# Patient Record
Sex: Male | Born: 1955
Health system: Southern US, Community
[De-identification: ages and names within clinical notes are randomized; demographics above are authoritative.]

## PROBLEM LIST (undated history)

## (undated) DIAGNOSIS — M170 Bilateral primary osteoarthritis of knee: Secondary | ICD-10-CM

## (undated) DIAGNOSIS — K219 Gastro-esophageal reflux disease without esophagitis: Secondary | ICD-10-CM

## (undated) DIAGNOSIS — E7800 Pure hypercholesterolemia, unspecified: Secondary | ICD-10-CM

## (undated) DIAGNOSIS — E785 Hyperlipidemia, unspecified: Secondary | ICD-10-CM

## (undated) DIAGNOSIS — E119 Type 2 diabetes mellitus without complications: Secondary | ICD-10-CM

## (undated) DIAGNOSIS — I10 Essential (primary) hypertension: Secondary | ICD-10-CM

## (undated) HISTORY — DX: Pure hypercholesterolemia, unspecified: E78.00

## (undated) HISTORY — DX: Gastro-esophageal reflux disease without esophagitis: K21.9

## (undated) HISTORY — PX: OTHER SURGICAL HISTORY: SHX169

## (undated) HISTORY — PX: CATARACT EXTRACTION: SUR2

---

## 1996-03-04 ENCOUNTER — Other Ambulatory Visit: Payer: Self-pay

## 2013-08-11 ENCOUNTER — Encounter (INDEPENDENT_AMBULATORY_CARE_PROVIDER_SITE_OTHER): Payer: Self-pay | Admitting: *Deleted

## 2017-02-21 ENCOUNTER — Emergency Department (HOSPITAL_COMMUNITY)
Admission: EM | Admit: 2017-02-21 | Discharge: 2017-02-22 | Disposition: A | Discharge status: Home / Self Care | Payer: BLUE CROSS/BLUE SHIELD | Attending: Emergency Medicine | Admitting: Emergency Medicine

## 2017-02-21 ENCOUNTER — Encounter (HOSPITAL_COMMUNITY): Payer: Self-pay

## 2017-02-21 ENCOUNTER — Emergency Department (HOSPITAL_COMMUNITY): Payer: BLUE CROSS/BLUE SHIELD

## 2017-02-21 DIAGNOSIS — E119 Type 2 diabetes mellitus without complications: Secondary | ICD-10-CM | POA: Diagnosis not present

## 2017-02-21 DIAGNOSIS — S161XXA Strain of muscle, fascia and tendon at neck level, initial encounter: Secondary | ICD-10-CM | POA: Diagnosis not present

## 2017-02-21 DIAGNOSIS — Y999 Unspecified external cause status: Secondary | ICD-10-CM | POA: Insufficient documentation

## 2017-02-21 DIAGNOSIS — I10 Essential (primary) hypertension: Secondary | ICD-10-CM | POA: Diagnosis not present

## 2017-02-21 DIAGNOSIS — Y9389 Activity, other specified: Secondary | ICD-10-CM | POA: Insufficient documentation

## 2017-02-21 DIAGNOSIS — S199XXA Unspecified injury of neck, initial encounter: Secondary | ICD-10-CM | POA: Diagnosis present

## 2017-02-21 DIAGNOSIS — Y92410 Unspecified street and highway as the place of occurrence of the external cause: Secondary | ICD-10-CM | POA: Insufficient documentation

## 2017-02-21 HISTORY — DX: Type 2 diabetes mellitus without complications: E11.9

## 2017-02-21 HISTORY — DX: Hyperlipidemia, unspecified: E78.5

## 2017-02-21 HISTORY — DX: Essential (primary) hypertension: I10

## 2017-02-21 MED ORDER — IBUPROFEN 400 MG PO TABS
400.0000 mg | ORAL_TABLET | Freq: Once | ORAL | Status: AC
  Administered 2017-02-21: 400 mg via ORAL
  Filled 2017-02-21: qty 1
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
## 2017-02-22 MED ORDER — NAPROXEN 500 MG PO TABS: 500.0000 mg | ORAL_TABLET | Freq: Two times a day (BID) | ORAL | 0 refills | Status: DC

## 2017-02-22 NOTE — Discharge Instructions (Signed)
Use the antiinflammatories as prescribed. Follow up with your doctor. Return to the ED if you develop new or worsening symptoms.

## 2017-09-03 ENCOUNTER — Ambulatory Visit (INDEPENDENT_AMBULATORY_CARE_PROVIDER_SITE_OTHER): Payer: BLUE CROSS/BLUE SHIELD | Admitting: Family Medicine

## 2017-09-03 ENCOUNTER — Encounter: Payer: Self-pay | Admitting: Family Medicine

## 2017-09-03 ENCOUNTER — Other Ambulatory Visit: Payer: Self-pay

## 2017-09-03 VITALS — BP 94/50 | HR 76 | Temp 98.2°F | Resp 18 | Ht 69.0 in | Wt 238.0 lb

## 2017-09-03 DIAGNOSIS — E1165 Type 2 diabetes mellitus with hyperglycemia: Secondary | ICD-10-CM

## 2017-09-03 DIAGNOSIS — I10 Essential (primary) hypertension: Secondary | ICD-10-CM | POA: Diagnosis not present

## 2017-09-03 DIAGNOSIS — Z79899 Other long term (current) drug therapy: Secondary | ICD-10-CM | POA: Diagnosis not present

## 2017-09-03 DIAGNOSIS — E1169 Type 2 diabetes mellitus with other specified complication: Secondary | ICD-10-CM | POA: Diagnosis not present

## 2017-09-03 DIAGNOSIS — Z23 Encounter for immunization: Secondary | ICD-10-CM

## 2017-09-03 DIAGNOSIS — I499 Cardiac arrhythmia, unspecified: Secondary | ICD-10-CM

## 2017-09-03 DIAGNOSIS — R5383 Other fatigue: Secondary | ICD-10-CM | POA: Diagnosis not present

## 2017-09-03 DIAGNOSIS — E785 Hyperlipidemia, unspecified: Secondary | ICD-10-CM

## 2017-09-03 MED ORDER — METFORMIN HCL ER 500 MG PO TB24: ORAL_TABLET | ORAL | 1 refills | Status: DC

## 2017-09-03 MED ORDER — METOPROLOL SUCCINATE ER 25 MG PO TB24: 25.0000 mg | ORAL_TABLET | Freq: Every day | ORAL | 3 refills | Status: DC

## 2017-09-03 NOTE — Patient Instructions (Signed)
Stop the Norvasc for Blood pressure Start the Metoprolol XR 25 mg , once a day  Go see the Cardiologist. Keep your follow up appointment with me.   Will give you pneumonia shot at follow up.

## 2017-09-03 NOTE — Progress Notes (Signed)
Patient ID: Christopher Sullivan, male    DOB: 04/28/1956, 61 y.o.   MRN: [id number]  Chief Complaint  Patient presents with  . Diabetes    Allergies Patient has no known allergies.  Subjective:   Christopher Sullivan is a 61 y.o. male who presents to [facility] today.  HPI Here to establish care. Reports that he has been doing well. Reports he does not always remember to take his diabetic medication twice a day. Has been taking his aspirin every day. Comes in because would like to establish care and needs refills. Works at [employer] and works third shift. Reports he has never been told that his heart rate was irregular. Denies any chest pain shortness of breath or palpitations. Reports that he feels fine. Has not taken any blood pressure medications for today. Has been diabetic for many years. Never on insulin denies any hypoglycemic episodes or problems with his diabetic medications. Has been working at losing weight. Followed by podiatry and has no lesions on his feet. Reports blood pressure runs okay. Has been on cholesterol medication for a long time. Denies any muscle aches or pains with his cholesterol medicine. Does not have a glucometer. Does not check his sugars at home. Reports that he goes out to eat several times a week. Denies any swelling in his legs, dyspnea on exertion or waking up from his sleep short of breath. Not short of breath with walking up hills. Does get tired at times but reports that works third shift and does not always get a lot of sleep.   Diabetes  He presents for his initial diabetic visit. He has type 2 diabetes mellitus. No MedicAlert identification noted. His disease course has been fluctuating. There are no hypoglycemic associated symptoms. Pertinent negatives for hypoglycemia include no dizziness, headaches, nervousness/anxiousness, speech difficulty or tremors. Pertinent negatives for diabetes include no chest pain, no fatigue, no foot ulcerations, no  polydipsia, no polyphagia, no visual change, no weakness and no weight loss. There are no hypoglycemic complications. Symptoms are stable. There are no diabetic complications. Risk factors for coronary artery disease include dyslipidemia, family history, male sex, obesity and hypertension. Current diabetic treatment includes oral agent (dual therapy). He is compliant with treatment most of the time. He is following a generally healthy diet. When asked about meal planning, he reported none. He participates in exercise intermittently. An ACE inhibitor/angiotensin II receptor blocker is being taken. He sees a podiatrist.Eye exam is current.    Past Medical History:  Diagnosis Date  . Diabetes mellitus without complication ([place])   . GERD (gastroesophageal reflux disease)   . Hyperlipidemia   . Hypertension     No past surgical history on file.  Family History  Problem Relation Age of Onset  . Hypertension Mother   . Diabetes Mother   . Arthritis Mother   . Heart disease Mother   . Miscarriages / [place] Mother   . Stroke Mother   . Depression Father   . Mental illness Father   . Lupus Sister   . Diabetes Daughter   . Hypertension Daughter      Social History   Social History  . Marital status: Unknown    Spouse name: N/A  . Number of children: N/A  . Years of education: N/A   Occupational History  . [place]    Social History Main Topics  . Smoking status: Never Smoker  . Smokeless tobacco: Never Used  . Alcohol use No  .  Drug use: No  . Sexual activity: Not Asked   Other Topics Concern  . None   Social History Narrative   Married. Works at [employer]. Third shift.    Two children/stepson and daughter.        Review of Systems  Constitutional: Negative for activity change, appetite change, chills, diaphoresis, fatigue, fever and weight loss.  HENT: Negative for hearing loss, rhinorrhea, sinus pain, sneezing, tinnitus, trouble swallowing and voice  change.   Eyes: Negative for photophobia and visual disturbance.  Respiratory: Negative for cough, choking, chest tightness and shortness of breath.   Cardiovascular: Negative for chest pain, palpitations and leg swelling.  Gastrointestinal: Negative for abdominal pain, nausea and vomiting.  Endocrine: Negative for cold intolerance, heat intolerance, polydipsia and polyphagia.  Genitourinary: Negative for dysuria and frequency.  Musculoskeletal: Negative for gait problem and neck pain.  Skin: Negative for rash.  Allergic/Immunologic: Negative for environmental allergies.  Neurological: Negative for dizziness, tremors, syncope, speech difficulty, weakness, numbness and headaches.  Hematological: Negative for adenopathy.  Psychiatric/Behavioral: Negative for agitation, decreased concentration, dysphoric mood and sleep disturbance. The patient is not nervous/anxious.      Objective:   BP (!) 94/50 (BP Location: Right Arm, Patient Position: Sitting, Cuff Size: Large)   Pulse 76   Temp 98.2 F (36.8 C) (Temporal)   Resp 18   Ht 5\' 9"  (1.753 m)   Wt 238 lb 0.6 oz (108 kg)   SpO2 98%   BMI 35.15 kg/m   Physical Exam  Constitutional: He is oriented to person, place, and time. He appears well-developed and well-nourished.  HENT:  Head: Normocephalic and atraumatic.  Eyes: Pupils are equal, round, and reactive to light. EOM are normal.  Neck: Normal range of motion. Neck supple. No thyromegaly present.  Cardiovascular: Normal heart sounds, intact distal pulses and normal pulses.  An irregularly irregular rhythm present. PMI is not displaced.   Pulses:      Dorsalis pedis pulses are 2+ on the right side, and 2+ on the left side.  Pulmonary/Chest: Effort normal and breath sounds normal.  Abdominal: Soft. Bowel sounds are normal.  Musculoskeletal: He exhibits no edema.  Neurological: He is alert and oriented to person, place, and time. No cranial nerve deficit.  Skin: Skin is warm, dry  and intact.  Psychiatric: He has a normal mood and affect. His behavior is normal. Thought content normal.  Vitals reviewed.    Assessment and Plan   1. Type 2 diabetes mellitus with hyperglycemia, unspecified whether long term insulin use ([place]) Eye exam current. Check feet at follow up.  Continue ASA 81 mg qd.  - glipiZIDE (GLUCOTROL) 10 MG tablet; Take 10 mg by mouth daily before breakfast. - COMPLETE METABOLIC PANEL WITH GFR - Hemoglobin A1c - Microalbumin / creatinine urine ratio - metFORMIN (GLUCOPHAGE XR) 500 MG 24 hr tablet; Take four pills each morning as directed  Dispense: 360 tablet; Refill: 1  2. Hyperlipidemia associated with type 2 diabetes mellitus (HCC) Hyperlipidemia and the associated risk of ASCVD were discussed today. Primary vs. Secondary prevention of ASCVD were discussed and how it relates to patient morbidity, mortality, and quality of life. Shared decision making with patient including the risks of statins vs.benefits of ASCVD risk reduction discussed.  Risks of stains discussed including myopathy, rhabdomyoloysis, liver problems, increased risk of diabetes discussed. We discussed heart healthy diet, lifestyle modifications, risk factor modifications, and adherence to the recommended treatment plan. We discussed the need to periodically monitor lipid panel  and liver function tests while on statin therapy. He wishes to continue therapy.   - simvastatin (ZOCOR) 40 MG tablet; Take 40 mg by mouth daily. - aspirin EC 81 MG tablet; Take 81 mg by mouth daily. - Lipid panel  3. HTN, goal below 130/80 DC the norvasc. Check in one month.  - lisinopril-hydrochlorothiazide (PRINZIDE,ZESTORETIC) 20-12.5 MG tablet; Take 1 tablet by mouth daily. - metoprolol succinate (TOPROL-XL) 25 MG 24 hr tablet; Take 1 tablet (25 mg total) by mouth daily.  Dispense: 90 tablet; Refill: 3 Patient counseled in detail regarding the risks of medication. Told to call or return to clinic if develop  any worrisome signs or symptoms. Patient voiced understanding.   4. Irregular heart rhythm Check labs. EKG done which reveals frequent PVCs. Counseled regarding worrisome s/s and if develop to the ED.  - metoprolol succinate (TOPROL-XL) 25 MG 24 hr tablet; Take 1 tablet (25 mg total) by mouth daily.  Dispense: 90 tablet; Refill: 3 - Ambulatory referral to Cardiology - TSH  5. High risk medication use Check liver tests.   6. Fatigue, unspecified type Labs ordered.  - CBC  7. Immunization due Pneumonia shot at follow-up - Flu Vaccine QUAD 36+ mos IM    Return in about 2 months (around 11/03/2017). [name], MD 09/03/2017

## 2017-09-04 ENCOUNTER — Telehealth: Payer: Self-pay | Admitting: Family Medicine

## 2017-09-04 ENCOUNTER — Encounter: Payer: Self-pay | Admitting: Family Medicine

## 2017-09-04 LAB — CBC
HEMATOCRIT: 42.8 % (ref 38.5–50.0)
Hemoglobin: 14.5 g/dL (ref 13.2–17.1)
MCH: 29.5 pg (ref 27.0–33.0)
MCHC: 33.9 g/dL (ref 32.0–36.0)
MCV: 87.2 fL (ref 80.0–100.0)
MPV: 11.7 fL (ref 7.5–12.5)
Platelets: 211 10*3/uL (ref 140–400)
RBC: 4.91 10*6/uL (ref 4.20–5.80)
RDW: 13.2 % (ref 11.0–15.0)
WBC: 9.6 10*3/uL (ref 3.8–10.8)

## 2017-09-04 LAB — COMPLETE METABOLIC PANEL WITH GFR
AG RATIO: 1.2 (calc) (ref 1.0–2.5)
ALKALINE PHOSPHATASE (APISO): 84 U/L (ref 40–115)
ALT: 22 U/L (ref 9–46)
AST: 15 U/L (ref 10–35)
Albumin: 4.1 g/dL (ref 3.6–5.1)
BUN: 11 mg/dL (ref 7–25)
CO2: 27 mmol/L (ref 20–32)
Calcium: 9.9 mg/dL (ref 8.6–10.3)
Chloride: 102 mmol/L (ref 98–110)
Creat: 0.91 mg/dL (ref 0.70–1.25)
GFR, EST NON AFRICAN AMERICAN: 91 mL/min/{1.73_m2} (ref >=60)
GFR, Est African American: 105 mL/min/{1.73_m2} (ref >=60)
GLOBULIN: 3.3 g/dL (ref 1.9–3.7)
Glucose, Bld: 104 mg/dL — ABNORMAL HIGH (ref 65–99)
POTASSIUM: 3.7 mmol/L (ref 3.5–5.3)
SODIUM: 139 mmol/L (ref 135–146)
Total Bilirubin: 0.5 mg/dL (ref 0.2–1.2)
Total Protein: 7.4 g/dL (ref 6.1–8.1)

## 2017-09-04 LAB — HEMOGLOBIN A1C
HEMOGLOBIN A1C: 7.6 %{Hb} — AB (ref <5.7)
MEAN PLASMA GLUCOSE: 171 (calc)
eAG (mmol/L): 9.5 (calc)

## 2017-09-04 LAB — TSH: TSH: 2.79 m[IU]/L (ref 0.40–4.50)

## 2017-09-04 LAB — LIPID PANEL
CHOLESTEROL: 140 mg/dL (ref <200)
HDL: 46 mg/dL (ref >40)
LDL Cholesterol (Calc): 63 mg/dL (calc)
NON-HDL CHOLESTEROL (CALC): 94 mg/dL (ref <130)
Total CHOL/HDL Ratio: 3 (calc) (ref <5.0)
Triglycerides: 263 mg/dL — ABNORMAL HIGH (ref <150)

## 2017-09-04 LAB — MICROALBUMIN / CREATININE URINE RATIO
Creatinine, Urine: 201 mg/dL (ref 20–320)
Microalb Creat Ratio: 70 mcg/mg creat — ABNORMAL HIGH (ref <30)
Microalb, Ur: 14 mg/dL

## 2017-09-04 NOTE — Telephone Encounter (Signed)
Called patient regarding message below. No answer, left generic message for patient to return call.   

## 2017-09-04 NOTE — Telephone Encounter (Signed)
-----   Message from [name], MD sent at 09/04/2017  8:46 AM EDT ----- Please call and advise that his Hemglobin A1C was 7.6%. Advise patient that if he takes his metformin as directed that his blood sugar should be controlled when we recheck them. Advised that blood counts were normal. Electrolytes were normal. Advised that due to his diabetes he was having some protein in his urine. We will discuss this further at follow-up and likely increase one of his medications. Please advised that his thyroid was normal. Advised that kidney function is within normal limits however he is having some minimal damage due to the diabetes. Please advised that cholesterol was pretty good but he does need to decrease the amount of fats, processed food that he is eating because his triglycerides are high. We will discuss all labs at follow-up. Advised him to keep his appointment with cardiology.Gwen Her. Mannie Stabile, MD

## 2017-09-09 NOTE — Telephone Encounter (Signed)
I have been unable to reach this patient by phone.  A letter is being sent.

## 2017-10-01 ENCOUNTER — Ambulatory Visit (HOSPITAL_COMMUNITY)
Admission: RE | Admit: 2017-10-01 | Discharge: 2017-10-01 | Disposition: A | Discharge status: Home / Self Care | Payer: BLUE CROSS/BLUE SHIELD | Source: Ambulatory Visit | Attending: Cardiology | Admitting: Cardiology

## 2017-10-01 ENCOUNTER — Encounter: Payer: Self-pay | Admitting: Cardiology

## 2017-10-01 ENCOUNTER — Ambulatory Visit: Payer: BLUE CROSS/BLUE SHIELD | Admitting: Cardiology

## 2017-10-01 VITALS — BP 126/78 | HR 78 | Ht 72.0 in | Wt 244.0 lb

## 2017-10-01 DIAGNOSIS — I493 Ventricular premature depolarization: Secondary | ICD-10-CM

## 2017-10-01 NOTE — Patient Instructions (Signed)
Medication Instructions:  Your physician recommends that you continue on your current medications as directed. Please refer to the Current Medication list given to you today.'  Labwork: none  Testing/Procedures: Your physician has recommended that you wear a holter monitor. Holter monitors are medical devices that record the heart's electrical activity. Doctors most often use these monitors to diagnose arrhythmias. Arrhythmias are problems with the speed or rhythm of the heartbeat. The monitor is a small, portable device. You can wear one while you do your normal daily activities. This is usually used to diagnose what is causing palpitations/syncope (passing out).    Follow-Up: Your physician recommends that you schedule a follow-up appointment in: 6 weeks    Any Other Special Instructions Will Be Listed Below (If Applicable).     If you need a refill on your cardiac medications before your next appointment, please call your pharmacy.

## 2017-10-01 NOTE — Progress Notes (Signed)
Clinical Summary Christopher Sullivan is a 61 y.o.male seen as new consult, referred by Dr Mannie Stabile for iregular heart beat and PVCs.   1. Irregular heart beat/PVCs - noted recently by pcp to have frequent PVCs on EKG - started on Toprol XL 25mg  by pcp at that time - recent K was 3.7, TSH 2.79  - denies any palpitations - aching pain left chest can last a few minutes. Can occur at rest or with exertion. Total of 2-3 episodes, none recently - no edema. No SOB/DOE. 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  - limited caffeine. No EtoH.      Past Medical History:  Diagnosis Date  . Diabetes mellitus without complication ([place])   . GERD (gastroesophageal reflux disease)   . Hyperlipidemia   . Hypertension      No Known Allergies   Current Outpatient Medications  Medication Sig Dispense Refill  . aspirin EC 81 MG tablet Take 81 mg by mouth daily.    Marland Kitchen glipiZIDE (GLUCOTROL) 10 MG tablet Take 10 mg by mouth daily before breakfast.    . lisinopril-hydrochlorothiazide (PRINZIDE,ZESTORETIC) 20-12.5 MG tablet Take 1 tablet by mouth daily.    . metFORMIN (GLUCOPHAGE XR) 500 MG 24 hr tablet Take four pills each morning as directed 360 tablet 1  . metoprolol succinate (TOPROL-XL) 25 MG 24 hr tablet Take 1 tablet (25 mg total) by mouth daily. 90 tablet 3  . naproxen (NAPROSYN) 500 MG tablet Take 1 tablet (500 mg total) by mouth 2 (two) times daily. 30 tablet 0  . simvastatin (ZOCOR) 40 MG tablet Take 40 mg by mouth daily.     No current facility-administered medications for this visit.      No past surgical history on file.   No Known Allergies    Family History  Problem Relation Age of Onset  . Hypertension Mother   . Diabetes Mother   . Arthritis Mother   . Heart disease Mother   . Miscarriages / [place] Mother   . Stroke Mother   . Depression Father   . Mental illness Father   . Lupus Sister   . Diabetes Daughter   . Hypertension Daughter        Social History Christopher Sullivan reports that  has never smoked. he has never used smokeless tobacco. Christopher Sullivan reports that he does not drink alcohol.   Review of Systems CONSTITUTIONAL: No weight loss, fever, chills, weakness or fatigue.  HEENT: Eyes: No visual loss, blurred vision, double vision or yellow sclerae.No hearing loss, sneezing, congestion, runny nose or sore throat.  SKIN: No rash or itching.  CARDIOVASCULAR: per hpi RESPIRATORY: No shortness of breath, cough or sputum.  GASTROINTESTINAL: No anorexia, nausea, vomiting or diarrhea. No abdominal pain or blood.  GENITOURINARY: No burning on urination, no polyuria NEUROLOGICAL: No headache, dizziness, syncope, paralysis, ataxia, numbness or tingling in the extremities. No change in bowel or bladder control.  MUSCULOSKELETAL: No muscle, back pain, joint pain or stiffness.  LYMPHATICS: No enlarged nodes. No history of splenectomy.  PSYCHIATRIC: No history of depression or anxiety.  ENDOCRINOLOGIC: No reports of sweating, cold or heat intolerance. No polyuria or polydipsia.  Marland Kitchen   Physical Examination Vitals:   10/01/17 0840 10/01/17 0847  BP: 132/86 126/78  Pulse: 78   SpO2: 98%    Vitals:   10/01/17 0840  Weight: 244 lb (110.7 kg)  Height: 6' (1.829 m)    Gen: resting comfortably, no acute  distress HEENT: no scleral icterus, pupils equal round and reactive, no palptable cervical adenopathy,  CV: irreg, no m/r/g, no jvd Resp: Clear to auscultation bilaterally GI: abdomen is soft, non-tender, non-distended, normal bowel sounds, no hepatosplenomegaly MSK: extremities are warm, no edema.  Skin: warm, no rash Neuro:  no focal deficits Psych: appropriate affect   Diagnostic Studies     Assessment and Plan  1. PVCs - we will obtain a 24 hr holter monitor to quantify his pvc burden. Pending results will consider echo to evaluate for structurual heart disease and possible stress testing as well. Futher testing pending  holter results - continue beta blocker   F/u 6 weeks      Arnoldo Lenis, M.D.

## 2017-10-03 ENCOUNTER — Encounter: Payer: Self-pay | Admitting: Cardiology

## 2017-10-09 ENCOUNTER — Telehealth: Payer: Self-pay

## 2017-10-09 NOTE — Telephone Encounter (Signed)
Called pt., no answer. Left message for pt. To return call.  

## 2017-10-09 NOTE — Telephone Encounter (Signed)
-----   Message from Arnoldo Lenis, MD sent at 10/09/2017  1:30 PM EST ----- Heart monitor looks good, only occaional extra heart beats. Not enough to be concerned. I'd like to see him back in 2 months to discuss further and  reevaluate   Christopher Abts MD

## 2017-10-11 ENCOUNTER — Telehealth: Payer: Self-pay | Admitting: *Deleted

## 2017-10-11 NOTE — Telephone Encounter (Signed)
Called patient with test results. No answer. Left message to call back.  

## 2017-10-11 NOTE — Telephone Encounter (Signed)
-----   Message from Arnoldo Lenis, MD sent at 10/09/2017  1:30 PM EST ----- Heart monitor looks good, only occaional extra heart beats. Not enough to be concerned. I'd like to see him back in 2 months to discuss further and  reevaluate   Zandra Abts MD

## 2017-11-06 ENCOUNTER — Ambulatory Visit: Payer: BLUE CROSS/BLUE SHIELD | Admitting: Family Medicine

## 2017-11-14 ENCOUNTER — Other Ambulatory Visit: Payer: Self-pay | Admitting: Family Medicine

## 2017-11-27 ENCOUNTER — Ambulatory Visit: Payer: BLUE CROSS/BLUE SHIELD | Admitting: Cardiology

## 2017-11-27 ENCOUNTER — Encounter: Payer: Self-pay | Admitting: Cardiology

## 2017-11-27 NOTE — Progress Notes (Deleted)
Clinical Summary Mr. Seabrook is a 62 y.o.male  1. Irregular heart beat/PVCs - noted recently by pcp to have frequent PVCs on EKG - started on Toprol XL 25mg  by pcp at that time - recent K was 3.7, TSH 2.79  - denies any palpitations - aching pain left chest can last a few minutes. Can occur at rest or with exertion. Total of 2-3 episodes, none recently - no edema. No SOB/DOE. 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  - limited caffeine. No EtoH.   09/2017 holter with only 3% PVC burden, no significant NSVT or VT -   Past Medical History:  Diagnosis Date  . Diabetes mellitus without complication ([place])   . GERD (gastroesophageal reflux disease)   . Hyperlipidemia   . Hypertension      No Known Allergies   Current Outpatient Medications  Medication Sig Dispense Refill  . aspirin EC 81 MG tablet Take 81 mg by mouth daily.    Marland Kitchen glipiZIDE (GLUCOTROL) 10 MG tablet Take 10 mg by mouth daily before breakfast.    . lisinopril-hydrochlorothiazide (PRINZIDE,ZESTORETIC) 20-12.5 MG tablet Take 1 tablet by mouth daily.    Marland Kitchen lisinopril-hydrochlorothiazide (PRINZIDE,ZESTORETIC) 20-25 MG tablet TAKE ONE (1) TABLET EACH DAY 90 tablet 0  . metFORMIN (GLUCOPHAGE XR) 500 MG 24 hr tablet Take four pills each morning as directed 360 tablet 1  . metoprolol succinate (TOPROL-XL) 25 MG 24 hr tablet Take 1 tablet (25 mg total) by mouth daily. 90 tablet 3  . simvastatin (ZOCOR) 40 MG tablet Take 40 mg by mouth daily.     No current facility-administered medications for this visit.      No past surgical history on file.   No Known Allergies    Family History  Problem Relation Age of Onset  . Hypertension Mother   . Diabetes Mother   . Arthritis Mother   . Heart disease Mother   . Miscarriages / [place] Mother   . Stroke Mother   . Depression Father   . Mental illness Father   . Lupus Sister   . Diabetes Daughter   . Hypertension Daughter       Social History Mr. Bensinger reports that  has never smoked. he has never used smokeless tobacco. Mr. Vogel reports that he does not drink alcohol.   Review of Systems CONSTITUTIONAL: No weight loss, fever, chills, weakness or fatigue.  HEENT: Eyes: No visual loss, blurred vision, double vision or yellow sclerae.No hearing loss, sneezing, congestion, runny nose or sore throat.  SKIN: No rash or itching.  CARDIOVASCULAR:  RESPIRATORY: No shortness of breath, cough or sputum.  GASTROINTESTINAL: No anorexia, nausea, vomiting or diarrhea. No abdominal pain or blood.  GENITOURINARY: No burning on urination, no polyuria NEUROLOGICAL: No headache, dizziness, syncope, paralysis, ataxia, numbness or tingling in the extremities. No change in bowel or bladder control.  MUSCULOSKELETAL: No muscle, back pain, joint pain or stiffness.  LYMPHATICS: No enlarged nodes. No history of splenectomy.  PSYCHIATRIC: No history of depression or anxiety.  ENDOCRINOLOGIC: No reports of sweating, cold or heat intolerance. No polyuria or polydipsia.  Marland Kitchen   Physical Examination There were no vitals filed for this visit. There were no vitals filed for this visit.  Gen: resting comfortably, no acute distress HEENT: no scleral icterus, pupils equal round and reactive, no palptable cervical adenopathy,  CV Resp: Clear to auscultation bilaterally GI: abdomen is soft, non-tender, non-distended, normal bowel sounds, no hepatosplenomegaly MSK: extremities  are warm, no edema.  Skin: warm, no rash Neuro:  no focal deficits Psych: appropriate affect   Diagnostic Studies  09/2017 24 hr heart monitor 24-hour Holter monitor reviewed. Sinus rhythm is present throughout. Heart rate ranged from 58 bpm up to 111 bpm with average heart rate 79 bpm. There were no significant pauses. Occasional PVCs were noted with rare couplets and bigeminy (approximately 3% of total beats). Rare PACs noted.   Assessment and Plan  1.  PVCs - we will obtain a 24 hr holter monitor to quantify his pvc burden. Pending results will consider echo to evaluate for structurual heart disease and possible stress testing as well. Futher testing pending holter results - continue beta blocker   F/u 6 weeks      Arnoldo Lenis, M.D.

## 2017-12-04 ENCOUNTER — Ambulatory Visit: Payer: BLUE CROSS/BLUE SHIELD | Admitting: Family Medicine

## 2017-12-11 ENCOUNTER — Ambulatory Visit (INDEPENDENT_AMBULATORY_CARE_PROVIDER_SITE_OTHER): Payer: BLUE CROSS/BLUE SHIELD | Admitting: Family Medicine

## 2017-12-11 ENCOUNTER — Encounter: Payer: Self-pay | Admitting: Family Medicine

## 2017-12-11 ENCOUNTER — Other Ambulatory Visit: Payer: Self-pay

## 2017-12-11 VITALS — BP 120/82 | HR 84 | Temp 98.9°F | Resp 16 | Ht 72.0 in | Wt 249.2 lb

## 2017-12-11 DIAGNOSIS — E1169 Type 2 diabetes mellitus with other specified complication: Secondary | ICD-10-CM

## 2017-12-11 DIAGNOSIS — I10 Essential (primary) hypertension: Secondary | ICD-10-CM

## 2017-12-11 DIAGNOSIS — Z79899 Other long term (current) drug therapy: Secondary | ICD-10-CM | POA: Diagnosis not present

## 2017-12-11 DIAGNOSIS — Z1211 Encounter for screening for malignant neoplasm of colon: Secondary | ICD-10-CM

## 2017-12-11 DIAGNOSIS — I493 Ventricular premature depolarization: Secondary | ICD-10-CM | POA: Diagnosis not present

## 2017-12-11 DIAGNOSIS — E119 Type 2 diabetes mellitus without complications: Secondary | ICD-10-CM | POA: Diagnosis not present

## 2017-12-11 DIAGNOSIS — R1310 Dysphagia, unspecified: Secondary | ICD-10-CM

## 2017-12-11 DIAGNOSIS — E785 Hyperlipidemia, unspecified: Secondary | ICD-10-CM | POA: Diagnosis not present

## 2017-12-11 MED ORDER — METFORMIN HCL 1000 MG PO TABS: 1000.0000 mg | ORAL_TABLET | Freq: Two times a day (BID) | ORAL | 3 refills | Status: DC

## 2017-12-11 NOTE — Progress Notes (Signed)
Patient ID: Christopher Sullivan, male    DOB: Mar 28, 1956, 62 y.o.   MRN: [id number]  Chief Complaint  Patient presents with  . Follow-up    Allergies Patient has no known allergies.  Subjective:   Christopher Sullivan is a 62 y.o. male who presents to [facility] today.  HPI Christopher Sullivan presents today for follow-up.  He has not been here since his initial visit in October.  He reports he has been taking his medications as directed.  He does take metformin 4 pills a day.  He would like for this pill burden to be decreased.  He feels like his blood pressure is been running well.  He denies any chest pain, shortness of breath, swelling in his extremities.  He did follow up with Dr. Harl Sullivan for the PVCs that were detected on his cardiac evaluation and EKG in our office at his last visit.  He had a Holter monitor performed which revealed a low burden of PVCs.  He reports he has not felt any flutters in his heart or any chest pain.  He has been taking the Toprol-XL as directed.  He does report that he has had some difficulty in swallowing certain foods over the past several months.  He reports that if he eats steak that it feels like the food gets stuck.  He reports that he is not always able to chew his food as well as he should because of poor dentition.  He reports he does plan on following up with the dentist in regards to his teeth.  However he does report that even at times when he swallows his pills he feels like they get stuck and he has to go into the bathroom and vomit to get the pills to come back up.  He has not had any weight loss.  Denies abdominal pain.  Denies melena, bright red blood per rectum, or hematochezia.  He has never experienced this symptom in the past.  He reports he is not a tobacco smoker.    Past Medical History:  Diagnosis Date  . Diabetes mellitus without complication ([place])   . GERD (gastroesophageal reflux disease)   . Hyperlipidemia   . Hypertension     No past  surgical history on file.  Family History  Problem Relation Age of Onset  . Hypertension Mother   . Diabetes Mother   . Arthritis Mother   . Heart disease Mother   . Miscarriages / [place] Mother   . Stroke Mother   . Depression Father   . Mental illness Father   . Lupus Sister   . Diabetes Daughter   . Hypertension Daughter      Social History   Socioeconomic History  . Marital status: Married    Spouse name: Not on file  . Number of children: Not on file  . Years of education: Not on file  . Highest education level: Not on file  Social Needs  . Financial resource strain: Not on file  . Food insecurity - worry: Not on file  . Food insecurity - inability: Not on file  . Transportation needs - medical: Not on file  . Transportation needs - non-medical: Not on file  Occupational History  . Occupation: [employer]  Tobacco Use  . Smoking status: Never Smoker  . Smokeless tobacco: Never Used  Substance and Sexual Activity  . Alcohol use: No  . Drug use: No  . Sexual activity: Not on file  Other  Topics Concern  . Not on file  Social History Narrative   Married. Works at [employer]. Third shift.    Two children/stepson and daughter.    Current Outpatient Medications on File Prior to Visit  Medication Sig Dispense Refill  . aspirin EC 81 MG tablet Take 81 mg by mouth daily.    Marland Kitchen glipiZIDE (GLUCOTROL) 10 MG tablet Take 10 mg by mouth daily before breakfast.    . lisinopril-hydrochlorothiazide (PRINZIDE,ZESTORETIC) 20-12.5 MG tablet Take 1 tablet by mouth daily.    . metoprolol succinate (TOPROL-XL) 25 MG 24 hr tablet Take 1 tablet (25 mg total) by mouth daily. 90 tablet 3  . simvastatin (ZOCOR) 40 MG tablet Take 40 mg by mouth daily.     No current facility-administered medications on file prior to visit.     Review of Systems   Objective:   BP 120/82 (BP Location: Left Arm, Patient Position: Sitting, Cuff Size: Normal)   Pulse 84   Temp 98.9 F  (37.2 C) (Temporal)   Resp 16   Ht 6' (1.829 m)   Wt 249 lb 4 oz (113.1 kg)   SpO2 97%   BMI 33.80 kg/m   Physical Exam  Constitutional: He is oriented to person, place, and time. He appears well-developed and well-nourished.  HENT:  Head: Normocephalic and atraumatic.  Mouth/Throat: Oropharynx is clear and moist.  Dentition in poor repair.  Patient missing multiple teeth.  Eyes: EOM are normal. Pupils are equal, round, and reactive to light.  Neck: Normal range of motion. Neck supple. No JVD present. No tracheal deviation present. No thyromegaly present.  Cardiovascular: Normal rate, regular rhythm and normal heart sounds.  Pulmonary/Chest: Effort normal and breath sounds normal.  Abdominal: Soft. Bowel sounds are normal. He exhibits no distension. There is no tenderness.  Musculoskeletal: He exhibits no edema.  Lymphadenopathy:    He has no cervical adenopathy.  Neurological: He is alert and oriented to person, place, and time. No cranial nerve deficit.  Skin: Skin is warm, dry and intact.  Psychiatric: He has a normal mood and affect. His behavior is normal. Thought content normal.  Vitals reviewed.    Assessment and Plan  1. Dysphagia, unspecified type Patient was counseled that we need to refer him to GI for evaluation of his esophagus.  We discussed that possible stricture versus esophageal dysmotility.  Since this is mainly occurring with bulky foods and when swallowing multiple pills at one time, he was encouraged to refrain from eating steak until evaluation.  In addition he was encouraged not to swallow for pills at a time but to swallow his pills one at a time.  Patient is also in need of colon cancer screening.  It would be beneficial for him to get his colonoscopy at time of endoscopy if indicated. - Ambulatory referral to Gastroenterology  2. Type 2 diabetes mellitus without complication, without long-term current use of insulin [facility]) Patient defers diabetic foot  exam today.  He reports he has worked all night in [employer] features [employer] and does not want to take his shoes off. - Hemoglobin A1c - Ambulatory referral to Ophthalmology -Change medication to metformin 1000 mg 1 p.o. twice daily.  New prescription sent in. -Foot care discussed with patient today. -We also discussed that her last visit patient's microalbumin was elevated.  We will plan to check this in 6 months.  He is to continue his ACE inhibitor as directed.  Control of blood pressure and diabetic control recommended. 3. Hyperlipidemia  associated with type 2 diabetes mellitus ([place]) Patient is LDL was significantly elevated at his last check.  Since then we have added statin medication.  Prior to his next visit he will return to clinic and get his lab work done and will adjust statin as needed to obtain an LDL of less than 70. - Lipid panel  4. HTN, goal below 130/80 Good control.  Continue medication as directed. - Basic metabolic panel  5. High risk medication use  - Basic metabolic panel - Hepatic function panel  6. Screen for colon cancer - Ambulatory referral to Gastroenterology 7.  PVCs Patient was seen and evaluated by cardiology.  Holter monitor revealed low burden of PVCs.  Was not felt that echo was indicated per cardiology.  Patient reports he will keep his scheduled visit with Dr. Harl Sullivan as directed.  Office visit note reviewed today from cardiology. Return in about 2 months (around 02/08/2018). [name], MD 12/11/2017

## 2017-12-11 NOTE — Patient Instructions (Signed)

## 2017-12-16 ENCOUNTER — Encounter: Payer: Self-pay | Admitting: Gastroenterology

## 2017-12-19 ENCOUNTER — Other Ambulatory Visit: Payer: Self-pay | Admitting: Family Medicine

## 2017-12-19 DIAGNOSIS — E785 Hyperlipidemia, unspecified: Principal | ICD-10-CM

## 2017-12-19 DIAGNOSIS — E1169 Type 2 diabetes mellitus with other specified complication: Secondary | ICD-10-CM

## 2017-12-23 ENCOUNTER — Other Ambulatory Visit: Payer: Self-pay | Admitting: Family Medicine

## 2017-12-23 DIAGNOSIS — E1165 Type 2 diabetes mellitus with hyperglycemia: Secondary | ICD-10-CM

## 2018-01-23 ENCOUNTER — Ambulatory Visit: Payer: BLUE CROSS/BLUE SHIELD | Admitting: Gastroenterology

## 2018-01-28 ENCOUNTER — Other Ambulatory Visit: Payer: Self-pay | Admitting: Family Medicine

## 2018-01-28 MED ORDER — GLUCOSE BLOOD VI STRP: 1.0000 | ORAL_STRIP | Freq: Two times a day (BID) | 12 refills | Status: DC

## 2018-01-29 ENCOUNTER — Telehealth: Payer: Self-pay | Admitting: Family Medicine

## 2018-01-29 DIAGNOSIS — E119 Type 2 diabetes mellitus without complications: Secondary | ICD-10-CM

## 2018-01-29 MED ORDER — BLOOD GLUCOSE MONITOR KIT: PACK | 0 refills | Status: DC

## 2018-01-29 NOTE — Telephone Encounter (Signed)
Referral to Diabetic Education and blood sugar monitor ordered per Dr. Mannie Stabile

## 2018-02-11 ENCOUNTER — Encounter: Payer: Self-pay | Admitting: Family Medicine

## 2018-02-11 ENCOUNTER — Other Ambulatory Visit: Payer: Self-pay

## 2018-02-11 ENCOUNTER — Ambulatory Visit: Payer: BLUE CROSS/BLUE SHIELD | Admitting: Family Medicine

## 2018-02-11 VITALS — BP 130/78 | HR 81 | Temp 98.5°F | Resp 16 | Ht 72.0 in | Wt 245.0 lb

## 2018-02-11 DIAGNOSIS — E785 Hyperlipidemia, unspecified: Secondary | ICD-10-CM | POA: Diagnosis not present

## 2018-02-11 DIAGNOSIS — Z23 Encounter for immunization: Secondary | ICD-10-CM

## 2018-02-11 DIAGNOSIS — I10 Essential (primary) hypertension: Secondary | ICD-10-CM

## 2018-02-11 DIAGNOSIS — N509 Disorder of male genital organs, unspecified: Secondary | ICD-10-CM | POA: Diagnosis not present

## 2018-02-11 DIAGNOSIS — E1140 Type 2 diabetes mellitus with diabetic neuropathy, unspecified: Secondary | ICD-10-CM

## 2018-02-11 DIAGNOSIS — E1169 Type 2 diabetes mellitus with other specified complication: Secondary | ICD-10-CM

## 2018-02-11 NOTE — Progress Notes (Signed)
Patient ID: Christopher Sullivan, male    DOB: 13-Apr-1956, 62 y.o.   MRN: [id number]  Chief Complaint  Patient presents with  . Follow-up    Allergies Patient has no known allergies.  Subjective:   Christopher Sullivan is a 62 y.o. male who presents to [facility] today.  HPI [name] presents today for follow-up.  He is taking his blood pressure medication, cholesterol medication, and diabetes medications.  He did not get his blood work prior to coming in today.  He was last seen several months ago.  He reports that he has been doing well.  He does not check his blood sugars on a regular basis.  He denies any hypoglycemic episodes.  He reports that for the past 2 years, he has had some numbness in 2 toes.  He reports that he uses Vicks salve and it helps with the pain.  He reports that when he uses the cream on his feet that his feet do not bother him and he has no trouble walking.  He is followed by podiatry.  He has a history of some arthritis in his toes and hammertoe deformity of toes.  He wears shoe inserts.  He denies any lesions on his feet.  He reports that he checks his feet daily and takes good care of them.  He denies any chest pain, shortness of breath, palpitations, or swelling in his extremities.  His energy level is good.  Appetite is good.  Bowel movements are regular.  Denies any melena or bright red blood per rectum.  Reports his blood pressure is been running well.  No side effects with his medications.  Denies any myalgias.  Takes cholesterol medication nightly.  Reports that for about 10 years he has had a skin lesion on his scrotum that has gotten bigger in size.  Reports that it is slightly pinkish in color.  Denies any pain unless it rubs with walking.  No bleeding in area.  No history of skin cancer.  Reports that he is always wondered what it is and wanted to have it checked.  Reports he is urinating well.  No masses in his groin area.  No penile discharge.  Denies any  ulcerations in his genital area.  No history of sexually transmitted infections.    Past Medical History:  Diagnosis Date  . Diabetes mellitus without complication ([place])   . GERD (gastroesophageal reflux disease)   . Hyperlipidemia   . Hypertension     No past surgical history on file.  Family History  Problem Relation Age of Onset  . Hypertension Mother   . Diabetes Mother   . Arthritis Mother   . Heart disease Mother   . Miscarriages / [place] Mother   . Stroke Mother   . Depression Father   . Mental illness Father   . Lupus Sister   . Diabetes Daughter   . Hypertension Daughter      Social History   Socioeconomic History  . Marital status: Married    Spouse name: None  . Number of children: None  . Years of education: None  . Highest education level: None  Social Needs  . Financial resource strain: None  . Food insecurity - worry: None  . Food insecurity - inability: None  . Transportation needs - medical: None  . Transportation needs - non-medical: None  Occupational History  . Occupation: [employer]  Tobacco Use  . Smoking status: Never Smoker  . Smokeless  tobacco: Never Used  Substance and Sexual Activity  . Alcohol use: No  . Drug use: No  . Sexual activity: None  Other Topics Concern  . None  Social History Narrative   Married. Works at [employer]. Third shift.    Two children/stepson and daughter.    Current Outpatient Medications on File Prior to Visit  Medication Sig Dispense Refill  . aspirin EC 81 MG tablet Take 81 mg by mouth daily.    . blood glucose meter kit and supplies KIT Dispense based on patient and insurance preference. Use up to four times daily as directed. (FOR ICD-9 250.00, 250.01). 1 each 0  . glipiZIDE (GLUCOTROL) 10 MG tablet TAKE ONE (1) TABLET EACH DAY 90 tablet 0  . glucose blood (CONTOUR NEXT TEST) test strip 1 each by Other route 2 (two) times daily. Use as instructed 100 each 12  .  lisinopril-hydrochlorothiazide (PRINZIDE,ZESTORETIC) 20-12.5 MG tablet Take 1 tablet by mouth daily.    . metFORMIN (GLUCOPHAGE) 1000 MG tablet Take 1 tablet (1,000 mg total) by mouth 2 (two) times daily with a meal. 180 tablet 3  . metoprolol succinate (TOPROL-XL) 25 MG 24 hr tablet Take 1 tablet (25 mg total) by mouth daily. 90 tablet 3  . simvastatin (ZOCOR) 40 MG tablet TAKE ONE (1) TABLET BY MOUTH EVERY DAY 90 tablet 0   No current facility-administered medications on file prior to visit.     Review of Systems  Constitutional: Negative for fatigue and unexpected weight change.  Eyes: Negative for pain and visual disturbance.  Respiratory: Negative for cough and shortness of breath.   Cardiovascular: Negative for chest pain, palpitations and leg swelling.  Gastrointestinal: Negative for abdominal pain, constipation, diarrhea, nausea and vomiting.  Genitourinary: Negative for decreased urine volume, difficulty urinating, frequency, genital sores, hematuria, scrotal swelling, testicular pain and urgency.  Musculoskeletal: Negative for myalgias.  Skin:       Area on his skin he would like to have checked near his scrotum.  Neurological: Negative for tremors, syncope and light-headedness.  Psychiatric/Behavioral: Negative for dysphoric mood and sleep disturbance.     Objective:   BP 130/78 (BP Location: Left Arm, Patient Position: Sitting, Cuff Size: Normal)   Pulse 81   Temp 98.5 F (36.9 C) (Temporal)   Resp 16   Ht 6' (1.829 m)   Wt 245 lb (111.1 kg)   SpO2 97%   BMI 33.23 kg/m   Physical Exam  Constitutional: He is oriented to person, place, and time. He appears well-developed and well-nourished.  HENT:  Head: Normocephalic and atraumatic.  Eyes: EOM are normal. Pupils are equal, round, and reactive to light.  Neck: Normal range of motion. Neck supple. No thyromegaly present.  Cardiovascular: Normal rate, regular rhythm and normal heart sounds.  Pulses:      Dorsalis  pedis pulses are 0 on the right side, and 0 on the left side.  Pulmonary/Chest: Effort normal and breath sounds normal.  Musculoskeletal: He exhibits no edema.       Right foot: There is deformity. There is normal range of motion.       Left foot: There is deformity. There is normal range of motion.  Feet:  Right Foot:  Protective Sensation: 8 sites tested. 6 sites sensed.  Skin Integrity: Positive for callus. Negative for ulcer or blister.  Left Foot:  Protective Sensation: 8 sites tested. 6 sites sensed.  Skin Integrity: Positive for callus. Negative for ulcer or blister.  Neurological: He  is alert and oriented to person, place, and time. No cranial nerve deficit.  Skin: Skin is warm, dry and intact.  Left side lower scrotal region with 0.5 cm x 0.6 cm pedunculated skin tag.  Lesion pinkish in coloration.  Slightly tender to palpation.  No associated edema or induration.  Psychiatric: He has a normal mood and affect. His behavior is normal. Thought content normal.  Vitals reviewed.    Assessment and Plan  1. Skin lesion of scrotum Discussion with patient and his wife today regarding lesion.  At this time suspect the area in question is a pedunculated skin tag which is inflamed.  Patient would like to return to clinic for removal.  We did discuss that if we remove the area we will send it to pathology.  He voiced understanding.  Reports he was scheduled this visit in the near future but does not want to have it done today.  2. Type 2 diabetes mellitus with diabetic neuropathy, without long-term current use of insulin (HCC) Hemoglobin A1c and lab.  Elevated microalbumin creatinine ratio.  Plan to recheck in 6 months.  Diet, exercise, food choices, and weight loss discussed today.  3. Hyperlipidemia associated with type 2 diabetes mellitus ([place]) Any statin medication.  LDL at goal.  4. HTN, goal below 130/80 Control.The patient is asked to make an attempt to improve diet and exercise  patterns to aid in medical management of this problem. Lifestyle modifications discussed with patient including a diet emphasizing vegetables, fruits, and whole grains. Limiting intake of sodium to less than 2,400 mg per day.  Recommendations discussed include consuming low-fat dairy products, poultry, fish, legumes, non-tropical vegetable oils, and nuts; and limiting intake of sweets, sugar-sweetened beverages, and red meat. Discussed following a plan such as the Dietary Approaches to Stop Hypertension (DASH) diet. Patient to read up on this diet.   5. Immunization due Vaccination administered - Pneumococcal conjugate vaccine 13-valent  Patient did have to reschedule his gastroenterology appointment for dysphagia and evaluation for colonoscopy.  He reports that he does have this rescheduled and is at the beginning of April.  He was encouraged to keep this follow-up appointment. Return in about 3 months (around 05/14/2018), or Follow-up for skin biopsy, for Follow-up in 3 months for diabetes check. [name], MD 02/11/2018

## 2018-02-12 ENCOUNTER — Telehealth: Payer: Self-pay | Admitting: Family Medicine

## 2018-02-12 ENCOUNTER — Encounter: Payer: Self-pay | Admitting: Family Medicine

## 2018-02-12 LAB — HEPATIC FUNCTION PANEL
AG Ratio: 1.3 (calc) (ref 1.0–2.5)
ALBUMIN MSPROF: 4.1 g/dL (ref 3.6–5.1)
ALKALINE PHOSPHATASE (APISO): 81 U/L (ref 40–115)
ALT: 19 U/L (ref 9–46)
AST: 14 U/L (ref 10–35)
BILIRUBIN DIRECT: 0.1 mg/dL (ref 0.0–0.2)
BILIRUBIN TOTAL: 0.6 mg/dL (ref 0.2–1.2)
Globulin: 3.1 g/dL (calc) (ref 1.9–3.7)
Indirect Bilirubin: 0.5 mg/dL (calc) (ref 0.2–1.2)
Total Protein: 7.2 g/dL (ref 6.1–8.1)

## 2018-02-12 LAB — LIPID PANEL
CHOLESTEROL: 117 mg/dL (ref <200)
HDL: 35 mg/dL — ABNORMAL LOW (ref >40)
LDL CHOLESTEROL (CALC): 54 mg/dL
NON-HDL CHOLESTEROL (CALC): 82 mg/dL (ref <130)
Total CHOL/HDL Ratio: 3.3 (calc) (ref <5.0)
Triglycerides: 226 mg/dL — ABNORMAL HIGH (ref <150)

## 2018-02-12 LAB — BASIC METABOLIC PANEL
BUN: 18 mg/dL (ref 7–25)
CHLORIDE: 101 mmol/L (ref 98–110)
CO2: 28 mmol/L (ref 20–32)
Calcium: 9.6 mg/dL (ref 8.6–10.3)
Creat: 0.91 mg/dL (ref 0.70–1.25)
Glucose, Bld: 145 mg/dL — ABNORMAL HIGH (ref 65–99)
POTASSIUM: 3.9 mmol/L (ref 3.5–5.3)
Sodium: 137 mmol/L (ref 135–146)

## 2018-02-12 LAB — HEMOGLOBIN A1C
HEMOGLOBIN A1C: 8.6 %{Hb} — AB (ref <5.7)
Mean Plasma Glucose: 200 (calc)
eAG (mmol/L): 11.1 (calc)

## 2018-02-12 LAB — TSH: TSH: 2.7 m[IU]/L (ref 0.40–4.50)

## 2018-02-12 MED ORDER — GLIPIZIDE ER 10 MG PO TB24: 10.0000 mg | ORAL_TABLET | Freq: Every day | ORAL | 0 refills | Status: DC

## 2018-02-12 NOTE — Telephone Encounter (Signed)
Please advise that blood sugars are elevated and that A1c is higher than it was 3 months ago. Advise that his diabetes control is not as good as it was. He needs to work on his diet. WE also need to increase his glipizide to 10 mg bid or change him to the xl dosing where he can take it once a day. I am going to call in the new xl dosing of the medication so that he will take it once a day.  Advise him to make sure the pharmacy gives him the glucotrol/glipizide XL 10  A day. Not the plain glucotrol/glipizide. Needs to keep follow up. Letter sent in mail.

## 2018-02-13 NOTE — Telephone Encounter (Signed)
Patient informed of message below, verbalized understanding. Pharmacy notified of change.

## 2018-02-14 ENCOUNTER — Telehealth: Payer: Self-pay | Admitting: Family Medicine

## 2018-02-14 NOTE — Telephone Encounter (Signed)
Christopher Sullivan  Needs to speak with someone about an escript that was sent for patient

## 2018-02-14 NOTE — Telephone Encounter (Signed)
Spoke to pharmacy. They were asking for clarification on his glipizide. I clarified.

## 2018-02-21 ENCOUNTER — Other Ambulatory Visit: Payer: Self-pay | Admitting: Family Medicine

## 2018-02-26 ENCOUNTER — Encounter: Payer: BLUE CROSS/BLUE SHIELD | Attending: Family Medicine | Admitting: Nutrition

## 2018-02-26 VITALS — Ht 72.0 in | Wt 247.8 lb

## 2018-02-26 DIAGNOSIS — E118 Type 2 diabetes mellitus with unspecified complications: Secondary | ICD-10-CM

## 2018-02-26 DIAGNOSIS — E119 Type 2 diabetes mellitus without complications: Secondary | ICD-10-CM | POA: Insufficient documentation

## 2018-02-26 DIAGNOSIS — Z713 Dietary counseling and surveillance: Secondary | ICD-10-CM | POA: Diagnosis present

## 2018-02-26 DIAGNOSIS — IMO0002 Reserved for concepts with insufficient information to code with codable children: Secondary | ICD-10-CM

## 2018-02-26 DIAGNOSIS — E1165 Type 2 diabetes mellitus with hyperglycemia: Secondary | ICD-10-CM

## 2018-03-12 ENCOUNTER — Encounter: Payer: Self-pay | Admitting: Nutrition

## 2018-03-12 NOTE — Patient Instructions (Signed)
Goals 1. Follow My Plate 2. Eat three meals on time a day 3. Do not skip meals 4. Increase fresh fruits and vegetables. 5. Cut out fried and processed foods Take insulin as prescribed and follow sliding scale. Get A1C down to 7%.

## 2018-03-12 NOTE — Progress Notes (Signed)
Diabetes Self-Management Education  Visit Type: First/Initial  Appt. Start Time: 1000 Appt. End Time: 1100  03/12/2018  Mr. Christopher Sullivan, identified by name and date of birth, is a 62 y.o. male with a diagnosis of Diabetes: Type 2.   ASSESSMENT  Height 6' (1.829 m), weight 247 lb 12.8 oz (112.4 kg). Body mass index is 33.61 kg/m.  He is on Metformin 1000 mg BID and Glipizide 10 mg a day. FBS 150-180's.  Individualized Plan for Diabetes Self-Management Training:   Learning Objective:  Patient will have a greater understanding of diabetes self-management. Patient education plan is to attend individual and/or group sessions per assessed needs and concerns.   Plan:  Goals 1. Follow My Plate 2. Eat three meals on time a day and watch portions. 3. Do not skip meals 4. Increase fresh fruits and vegetables. 5. Cut out fried and processed foods Increase exercise. Get A1C down to 7%.  Expected Outcomes:  Demonstrated interest in learning. Expect positive outcomes  Education material provided: Living Well with Diabetes, Food label handouts, A1C conversion sheet, Meal plan card and Carbohydrate counting sheet  If problems or questions, patient to contact team via:  Phone and Email  Future DSME appointment: 4-6 wks

## 2018-03-13 ENCOUNTER — Ambulatory Visit: Payer: BLUE CROSS/BLUE SHIELD | Admitting: Gastroenterology

## 2018-03-13 ENCOUNTER — Encounter: Payer: Self-pay | Admitting: Gastroenterology

## 2018-03-13 ENCOUNTER — Other Ambulatory Visit: Payer: Self-pay

## 2018-03-13 VITALS — BP 120/67 | HR 90 | Temp 98.1°F | Ht 72.0 in | Wt 242.4 lb

## 2018-03-13 DIAGNOSIS — R1310 Dysphagia, unspecified: Secondary | ICD-10-CM

## 2018-03-13 DIAGNOSIS — Z1211 Encounter for screening for malignant neoplasm of colon: Secondary | ICD-10-CM

## 2018-03-13 MED ORDER — PEG 3350-KCL-NA BICARB-NACL 420 G PO SOLR: 4000.0000 mL | ORAL | 0 refills | Status: DC

## 2018-03-13 NOTE — Progress Notes (Signed)
cc'ed to pcp °

## 2018-03-13 NOTE — Assessment & Plan Note (Signed)
62 year old male with approximately 5 months history of solid food dysphagia, denying liquid or pill dysphagia. No weight loss, abdominal pain, lack of appetite. GERD symptoms are extremely rare, and he uses Tums prn. No PPI currently. May need PPI in future depending on findings from EGD. Further recommendations to follow.  Proceed with upper endoscopy./dilation in the near future with Dr. Oneida Alar. The risks, benefits, and alternatives have been discussed in detail with patient. They have stated understanding and desire to proceed.  Hold oral diabetes medication day of procedure

## 2018-03-13 NOTE — Patient Instructions (Signed)
We have scheduled you for a colonoscopy, upper endoscopy, and dilation with Dr. Oneida Alar in the near future.  Further recommendations to follow!  It was a pleasure to see you today. I strive to create trusting relationships with patients to provide genuine, compassionate, and quality care. I value your feedback. If you receive a survey regarding your visit,  I greatly appreciate you taking time to fill this out.   Annitta Needs, PhD, ANP-BC [facility]

## 2018-03-13 NOTE — Progress Notes (Signed)
Primary Care Physician:  [name], MD Primary Gastroenterologist:  Dr. Oneida Alar   Chief Complaint  Patient presents with  . Dysphagia    off/on, mostly steak  . Consult    TCS    HPI:   Christopher Sullivan is a 62 y.o. male presenting today at the request of Dr. Mannie Stabile secondary to dysphagia and need for initial screening colonoscopy. No prior EGD or  colonoscopy.  Notes chronic intermittent dysphagia, possibly around 5 months. Has choking with steak. Meats. No pill dysphagia. No liquid dysphagia. No abdominal pain. History of chronic GERD but no PPI. Occasional Tums but "not often". States rare need for Tums. No constipation, diarrhea. No rectal bleeding. No weight loss or lack of appetite. No FH of colorectal cancer or colon polyps.    Past Medical History:  Diagnosis Date  . Diabetes mellitus without complication ([place])   . GERD (gastroesophageal reflux disease)   . Hyperlipidemia   . Hypertension     Past Surgical History:  Procedure Laterality Date  . None      Current Outpatient Medications  Medication Sig Dispense Refill  . aspirin EC 81 MG tablet Take 81 mg by mouth daily.    . blood glucose meter kit and supplies KIT Dispense based on patient and insurance preference. Use up to four times daily as directed. (FOR ICD-9 250.00, 250.01). 1 each 0  . glipiZIDE (GLUCOTROL XL) 10 MG 24 hr tablet Take 1 tablet (10 mg total) by mouth daily with breakfast. 90 tablet 0  . glucose blood (CONTOUR NEXT TEST) test strip 1 each by Other route 2 (two) times daily. Use as instructed 100 each 12  . lisinopril-hydrochlorothiazide (PRINZIDE,ZESTORETIC) 20-25 MG tablet TAKE ONE (1) TABLET BY MOUTH EVERY DAY 90 tablet 0  . metFORMIN (GLUCOPHAGE) 1000 MG tablet Take 1 tablet (1,000 mg total) by mouth 2 (two) times daily with a meal. 180 tablet 3  . metoprolol succinate (TOPROL-XL) 25 MG 24 hr tablet Take 1 tablet (25 mg total) by mouth daily. 90 tablet 3  . simvastatin (ZOCOR) 40 MG tablet  TAKE ONE (1) TABLET BY MOUTH EVERY DAY 90 tablet 0   No current facility-administered medications for this visit.     Allergies as of 03/13/2018  . (No Known Allergies)    Family History  Problem Relation Age of Onset  . Hypertension Mother   . Diabetes Mother   . Arthritis Mother   . Heart disease Mother   . Miscarriages / [place] Mother   . Stroke Mother   . Depression Father   . Mental illness Father   . Lupus Sister   . Diabetes Daughter   . Hypertension Daughter   . Colon cancer Neg Hx   . Colon polyps Neg Hx     Social History   Socioeconomic History  . Marital status: Married    Spouse name: Not on file  . Number of children: Not on file  . Years of education: Not on file  . Highest education level: Not on file  Occupational History  . Occupation: [employer]  . Financial resource strain: Not on file  . Food insecurity:    Worry: Not on file    Inability: Not on file  . Transportation needs:    Medical: Not on file    Non-medical: Not on file  Tobacco Use  . Smoking status: Never Smoker  . Smokeless tobacco: Never Used  Substance and Sexual Activity  . Alcohol  use: No  . Drug use: No  . Sexual activity: Not on file  Lifestyle  . Physical activity:    Days per week: Not on file    Minutes per session: Not on file  . Stress: Not on file  Relationships  . Social connections:    Talks on phone: Not on file    Gets together: Not on file    Attends religious service: Not on file    Active member of club or organization: Not on file    Attends meetings of clubs or organizations: Not on file    Relationship status: Not on file  . Intimate partner violence:    Fear of current or ex partner: Not on file    Emotionally abused: Not on file    Physically abused: Not on file    Forced sexual activity: Not on file  Other Topics Concern  . Not on file  Social History Narrative   Married. Works at [employer]. Third shift.    Two  children/stepson and daughter.     Review of Systems: Gen: Denies any fever, chills, fatigue, weight loss, lack of appetite.  CV: Denies chest pain, heart palpitations, peripheral edema, syncope.  Resp: Denies shortness of breath at rest or with exertion. Denies wheezing or cough.  GI: see HPI  GU : Denies urinary burning, urinary frequency, urinary hesitancy MS: Denies joint pain, muscle weakness, cramps, or limitation of movement.  Derm: Denies rash, itching, dry skin Psych: Denies depression, anxiety, memory loss, and confusion Heme: Denies bruising, bleeding, and enlarged lymph nodes.  Physical Exam: BP 120/67   Pulse 90   Temp 98.1 F (36.7 C) (Oral)   Ht 6' (1.829 m)   Wt 242 lb 6.4 oz (110 kg)   BMI 32.88 kg/m  General:   Alert and oriented. Pleasant and cooperative. Well-nourished and well-developed.  Head:  Normocephalic and atraumatic. Eyes:  Without icterus, sclera clear and conjunctiva pink.  Ears:  Normal auditory acuity. Nose:  No deformity, discharge,  or lesions. Mouth:  No deformity or lesions, oral mucosa pink.  Lungs:  Clear to auscultation bilaterally. No wheezes, rales, or rhonchi. No distress.  Heart:  S1, S2 present without murmurs appreciated.  Abdomen:  +BS, soft, non-tender and non-distended. No HSM noted. No guarding or rebound. No masses appreciated.  Rectal:  Deferred  Msk:  Symmetrical without gross deformities. Normal posture. Extremities:  Without edema. Neurologic:  Alert and  oriented x4;  grossly normal neurologically. Skin:  Intact without significant lesions or rashes. Psych:  Alert and cooperative. Normal mood and affect.  Lab Results  Component Value Date   WBC 9.6 09/03/2017   HGB 14.5 09/03/2017   HCT 42.8 09/03/2017   MCV 87.2 09/03/2017   PLT 211 09/03/2017   Lab Results  Component Value Date   ALT 19 02/11/2018   AST 14 02/11/2018   BILITOT 0.6 02/11/2018   Lab Results  Component Value Date   CREATININE 0.91 02/11/2018    BUN 18 02/11/2018   NA 137 02/11/2018   K 3.9 02/11/2018   CL 101 02/11/2018   CO2 28 02/11/2018

## 2018-03-13 NOTE — Assessment & Plan Note (Signed)
No prior colonoscopy, and he is without any concerning lower GI symptoms or family history of colorectal cancer/polyps.  Proceed with colonoscopy with Dr. Oneida Alar in the near future. The risks, benefits, and alternatives have been discussed in detail with the patient. They state understanding and desire to proceed.

## 2018-03-17 NOTE — Patient Instructions (Signed)
Called [employer] for PA of TCS/EGD/DIL. Spoke to [employer]. No PA needed. Ref# [id number].

## 2018-03-27 ENCOUNTER — Telehealth: Payer: Self-pay | Admitting: *Deleted

## 2018-03-27 NOTE — Telephone Encounter (Signed)
LMOVM for pt. He is scheduled for TCS/EGD/DIL with [facility] on 05/12/18. Will need to r/s this appt as [facility] will not be in the hospital that day.

## 2018-03-28 ENCOUNTER — Other Ambulatory Visit: Payer: Self-pay | Admitting: Family Medicine

## 2018-03-28 DIAGNOSIS — E785 Hyperlipidemia, unspecified: Principal | ICD-10-CM

## 2018-03-28 DIAGNOSIS — E1169 Type 2 diabetes mellitus with other specified complication: Secondary | ICD-10-CM

## 2018-03-28 NOTE — Telephone Encounter (Signed)
LM w/ spouse to call back

## 2018-03-28 NOTE — Telephone Encounter (Signed)
Spoke with pt and he is now scheduled for 06/20/18 at 9:30am. New instructions mailed to patient. Called Kim in Endo and made aware of appt change.

## 2018-05-01 ENCOUNTER — Encounter: Payer: Self-pay | Admitting: Family Medicine

## 2018-05-01 ENCOUNTER — Ambulatory Visit: Payer: BLUE CROSS/BLUE SHIELD | Admitting: Nutrition

## 2018-05-02 ENCOUNTER — Encounter: Payer: Self-pay | Admitting: Family Medicine

## 2018-05-14 ENCOUNTER — Ambulatory Visit: Payer: BLUE CROSS/BLUE SHIELD | Admitting: Family Medicine

## 2018-05-28 ENCOUNTER — Other Ambulatory Visit: Payer: Self-pay

## 2018-05-28 MED ORDER — GLIPIZIDE ER 10 MG PO TB24: 10.0000 mg | ORAL_TABLET | Freq: Every day | ORAL | 0 refills | Status: DC

## 2018-06-19 ENCOUNTER — Telehealth: Payer: Self-pay | Admitting: Gastroenterology

## 2018-06-19 NOTE — Telephone Encounter (Signed)
LMOVM for endo scheduler to cancel procedure for tomorrow.

## 2018-06-19 NOTE — Telephone Encounter (Signed)
I spoke with the patient and he would like to have his procedure 10/4 at 9:30  Routing to [place] to mail out new instructions and call Hoyle Sauer

## 2018-06-19 NOTE — Telephone Encounter (Signed)
[phone] please call patient, he wants to reschedule his tcs for tomorrow

## 2018-06-19 NOTE — Telephone Encounter (Signed)
New instructions mailed. Endo scheduler aware.

## 2018-06-19 NOTE — Telephone Encounter (Signed)
Tried to call pt x 2, no answer, LMOAM at 9:55am for return call.

## 2018-06-19 NOTE — Telephone Encounter (Signed)
Tried to call pt, no answer 

## 2018-07-01 ENCOUNTER — Ambulatory Visit (INDEPENDENT_AMBULATORY_CARE_PROVIDER_SITE_OTHER): Payer: BLUE CROSS/BLUE SHIELD | Admitting: Family Medicine

## 2018-07-01 ENCOUNTER — Encounter: Payer: Self-pay | Admitting: Family Medicine

## 2018-07-01 ENCOUNTER — Other Ambulatory Visit: Payer: Self-pay

## 2018-07-01 VITALS — BP 120/60 | HR 89 | Temp 98.2°F | Resp 12 | Ht 72.0 in | Wt 239.1 lb

## 2018-07-01 DIAGNOSIS — E785 Hyperlipidemia, unspecified: Secondary | ICD-10-CM

## 2018-07-01 DIAGNOSIS — E1169 Type 2 diabetes mellitus with other specified complication: Secondary | ICD-10-CM

## 2018-07-01 DIAGNOSIS — I10 Essential (primary) hypertension: Secondary | ICD-10-CM

## 2018-07-01 DIAGNOSIS — E1140 Type 2 diabetes mellitus with diabetic neuropathy, unspecified: Secondary | ICD-10-CM

## 2018-07-01 MED ORDER — METFORMIN HCL 500 MG/5ML PO SOLN: 10.0000 mL | Freq: Two times a day (BID) | ORAL | 1 refills | Status: DC

## 2018-07-01 NOTE — Progress Notes (Signed)
Patient ID: Christopher Sullivan, male    DOB: 1956/03/26, 62 y.o.   MRN: [id number]  Chief Complaint  Patient presents with  . Hypertension    follow up  . Diabetes  . Hyperlipidemia    Allergies Patient has no known allergies.  Subjective:   Christopher Sullivan is a 62 y.o. male who presents to [facility] today.  HPI Mr. Saulter presents today for follow-up.  He reports he has been doing well.  He has lost 6 pounds since his last visit.  Has been trying to work on his diet and get more exercise.  Energy is good.  Taking all diabetic medications but has not been taking but 1 of the metformin pills a day.  Reports that has difficulty swallowing the metformin because it is so large.  Has upcoming appointment with GI secondary to difficulty with swallowing.  Has upcoming endoscopy and colonoscopy.  Taking acid reduction medication.  Has not gotten choked.  Would like to get metformin in liquid form if possible because has great deal of anxiety with trying to swallow this pill.  Has been struggling to get one down a day.  Reports that he did get it stuck in his throat one time and since then it is caused him significant anxiety.  Denies side effects with medication.  No hypoglycemic episodes.  Blood pressure has been running well.  Denies chest pain, shortness of breath, palpitations, or swelling in his extremities.  Denies myalgias with his cholesterol medication.  Reports that he feels like his overall health status has improved.   Past Medical History:  Diagnosis Date  . Diabetes mellitus without complication ([place])   . GERD (gastroesophageal reflux disease)   . Hyperlipidemia   . Hypertension     Past Surgical History:  Procedure Laterality Date  . None      Family History  Problem Relation Age of Onset  . Hypertension Mother   . Diabetes Mother   . Arthritis Mother   . Heart disease Mother   . Miscarriages / [place] Mother   . Stroke Mother   . Depression Father   . Mental  illness Father   . Lupus Sister   . Diabetes Daughter   . Hypertension Daughter   . Colon cancer Neg Hx   . Colon polyps Neg Hx      Social History   Socioeconomic History  . Marital status: Married    Spouse name: Not on file  . Number of children: Not on file  . Years of education: Not on file  . Highest education level: Not on file  Occupational History  . Occupation: [employer]  . Financial resource strain: Not on file  . Food insecurity:    Worry: Not on file    Inability: Not on file  . Transportation needs:    Medical: Not on file    Non-medical: Not on file  Tobacco Use  . Smoking status: Never Smoker  . Smokeless tobacco: Never Used  Substance and Sexual Activity  . Alcohol use: No  . Drug use: No  . Sexual activity: Not on file  Lifestyle  . Physical activity:    Days per week: Not on file    Minutes per session: Not on file  . Stress: Not on file  Relationships  . Social connections:    Talks on phone: Not on file    Gets together: Not on file    Attends religious service: Not  on file    Active member of club or organization: Not on file    Attends meetings of clubs or organizations: Not on file    Relationship status: Not on file  Other Topics Concern  . Not on file  Social History Narrative   Married. Works at [employer]. Third shift.    Two children/stepson and daughter.    Current Outpatient Medications on File Prior to Visit  Medication Sig Dispense Refill  . aspirin EC 81 MG tablet Take 81 mg by mouth daily.    . blood glucose meter kit and supplies KIT Dispense based on patient and insurance preference. Use up to four times daily as directed. (FOR ICD-9 250.00, 250.01). 1 each 0  . glipiZIDE (GLUCOTROL XL) 10 MG 24 hr tablet Take 1 tablet (10 mg total) by mouth daily with breakfast. 90 tablet 0  . glucose blood (CONTOUR NEXT TEST) test strip 1 each by Other route 2 (two) times daily. Use as instructed 100 each 12  .  lisinopril-hydrochlorothiazide (PRINZIDE,ZESTORETIC) 20-25 MG tablet TAKE ONE (1) TABLET BY MOUTH EVERY DAY 90 tablet 0  . metoprolol succinate (TOPROL-XL) 25 MG 24 hr tablet Take 1 tablet (25 mg total) by mouth daily. 90 tablet 3  . polyethylene glycol-electrolytes (TRILYTE) 420 g solution Take 4,000 mLs by mouth as directed. 4000 mL 0  . simvastatin (ZOCOR) 40 MG tablet TAKE ONE (1) TABLET BY MOUTH EVERY DAY 90 tablet 0   No current facility-administered medications on file prior to visit.    Current Outpatient Medications on File Prior to Visit  Medication Sig Dispense Refill  . aspirin EC 81 MG tablet Take 81 mg by mouth daily.    . blood glucose meter kit and supplies KIT Dispense based on patient and insurance preference. Use up to four times daily as directed. (FOR ICD-9 250.00, 250.01). 1 each 0  . glipiZIDE (GLUCOTROL XL) 10 MG 24 hr tablet Take 1 tablet (10 mg total) by mouth daily with breakfast. 90 tablet 0  . glucose blood (CONTOUR NEXT TEST) test strip 1 each by Other route 2 (two) times daily. Use as instructed 100 each 12  . lisinopril-hydrochlorothiazide (PRINZIDE,ZESTORETIC) 20-25 MG tablet TAKE ONE (1) TABLET BY MOUTH EVERY DAY 90 tablet 0  . metoprolol succinate (TOPROL-XL) 25 MG 24 hr tablet Take 1 tablet (25 mg total) by mouth daily. 90 tablet 3  . polyethylene glycol-electrolytes (TRILYTE) 420 g solution Take 4,000 mLs by mouth as directed. 4000 mL 0  . simvastatin (ZOCOR) 40 MG tablet TAKE ONE (1) TABLET BY MOUTH EVERY DAY 90 tablet 0   No current facility-administered medications on file prior to visit.     Review of Systems  Constitutional: Negative for appetite change, chills, fever and unexpected weight change.  HENT: Positive for trouble swallowing. Negative for voice change.   Eyes: Negative for visual disturbance.  Respiratory: Negative for cough, chest tightness, shortness of breath and wheezing.   Cardiovascular: Negative for chest pain, palpitations and leg  swelling.  Gastrointestinal: Negative for abdominal pain, diarrhea, nausea and vomiting.  Genitourinary: Negative for decreased urine volume and frequency.  Musculoskeletal: Negative for myalgias and neck pain.  Skin: Negative for rash.  Neurological: Negative for dizziness, tremors, syncope, facial asymmetry, weakness and headaches.  Hematological: Negative for adenopathy. Does not bruise/bleed easily.  Psychiatric/Behavioral: Negative for dysphoric mood and sleep disturbance. The patient is not nervous/anxious.      Objective:   BP 120/60 (BP Location: Left Arm, Patient  Position: Sitting, Cuff Size: Large)   Pulse 89   Temp 98.2 F (36.8 C) (Temporal)   Resp 12   Ht 6' (1.829 m)   Wt 239 lb 1.9 oz (108.5 kg)   SpO2 96% Comment: room air  BMI 32.43 kg/m   Physical Exam Alert and oriented, no acute distress.  Appearance consistent with stated age.  Heart sounds normal.  Regular rate and rhythm.  Lungs clear to auscultation bilaterally.  Cranial nerves II through XII grossly intact.  Extremities without any clubbing, cyanosis, or edema.  Mood euthymic.  Affect congruent with mood.  Skin no lesions on visualized area.  Assessment and Plan  1. Type 2 diabetes mellitus with diabetic neuropathy, without long-term current use of insulin ([place]) Check labs.  Patient has upcoming appointment with GI secondary to swallowing issues.  Has having difficulty swallowing metformin pills due to large size.  Will change to liquid at this time.  Patient will take the dosing twice a day as previously prescribed.  Will note in regards to his A1c that he is only been taking the medicine daily.  Congratulated on his weight loss.  Continue dietary modifications and exercise. - Hemoglobin A1c - Metformin HCl 500 MG/5ML SOLN; Take 10 mLs (1,000 mg total) by mouth 2 (two) times daily.  Dispense: 600 mL; Refill: 1  2. Hyperlipidemia associated with type 2 diabetes mellitus (H[facility]) Stable.  Continue statins.   Dietary modifications recommended and encouraged. 3. HTN, goal below 130/80 Stable.  Blood pressure at goal.  BMP up-to-date.  Continue to monitor salt intake.  No follow-ups on file.  Patient will get his flu shot at next visit.  Patient will follow-up with new PCP in 3 months or sooner if needed.  He will keep his scheduled appointment with GI and call with any questions or concerns. [name], MD 07/03/2018

## 2018-07-03 ENCOUNTER — Encounter: Payer: Self-pay | Admitting: Family Medicine

## 2018-08-14 ENCOUNTER — Other Ambulatory Visit: Payer: Self-pay | Admitting: Family Medicine

## 2018-08-14 ENCOUNTER — Other Ambulatory Visit: Payer: Self-pay

## 2018-08-14 DIAGNOSIS — E1169 Type 2 diabetes mellitus with other specified complication: Secondary | ICD-10-CM

## 2018-08-14 DIAGNOSIS — E785 Hyperlipidemia, unspecified: Principal | ICD-10-CM

## 2018-08-14 MED ORDER — SIMVASTATIN 40 MG PO TABS: ORAL_TABLET | ORAL | 0 refills | Status: DC

## 2018-08-18 ENCOUNTER — Telehealth: Payer: Self-pay | Admitting: Family Medicine

## 2018-08-18 ENCOUNTER — Ambulatory Visit (INDEPENDENT_AMBULATORY_CARE_PROVIDER_SITE_OTHER): Payer: BLUE CROSS/BLUE SHIELD

## 2018-08-18 DIAGNOSIS — I499 Cardiac arrhythmia, unspecified: Secondary | ICD-10-CM

## 2018-08-18 DIAGNOSIS — Z23 Encounter for immunization: Secondary | ICD-10-CM

## 2018-08-18 DIAGNOSIS — I10 Essential (primary) hypertension: Secondary | ICD-10-CM

## 2018-08-18 MED ORDER — GLIPIZIDE ER 10 MG PO TB24: 10.0000 mg | ORAL_TABLET | Freq: Every day | ORAL | 1 refills | Status: AC

## 2018-08-18 MED ORDER — METOPROLOL SUCCINATE ER 25 MG PO TB24: 25.0000 mg | ORAL_TABLET | Freq: Every day | ORAL | 3 refills | Status: AC

## 2018-08-18 MED ORDER — LISINOPRIL-HYDROCHLOROTHIAZIDE 20-25 MG PO TABS: 1.0000 | ORAL_TABLET | Freq: Every day | ORAL | 1 refills | Status: DC

## 2018-08-18 NOTE — Addendum Note (Signed)
Addended by: Marion Downer A on: 08/18/2018 12:19 PM   Modules accepted: Orders

## 2018-08-19 NOTE — Telephone Encounter (Signed)
Patient was in the office today with his wife.  He request a refill of his medication.  He also wanted to get his flu shot.  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  The form was completed and scanned into chart.  Medications were refilled.  He will keep his scheduled follow-up with new PCP office as directed.

## 2018-08-26 ENCOUNTER — Telehealth: Payer: Self-pay | Admitting: Gastroenterology

## 2018-08-26 NOTE — Telephone Encounter (Signed)
Pt called to cancel his procedure with [facility] on Friday. He wants to wait until next year to schedule due to "too much going on right now".

## 2018-08-26 NOTE — Telephone Encounter (Signed)
Verified with patient he did want to cancel TCS/EGD. He did not want to r/s. Reports he had a lot going on. He will call back next year to come back in. Called endo scheduler and cancelled procedure. FYI to AB

## 2018-08-29 ENCOUNTER — Encounter (HOSPITAL_COMMUNITY): Admission: RE | Payer: Self-pay | Source: Ambulatory Visit

## 2018-08-29 ENCOUNTER — Ambulatory Visit (HOSPITAL_COMMUNITY)
Admission: RE | Admit: 2018-08-29 | Payer: BLUE CROSS/BLUE SHIELD | Source: Ambulatory Visit | Admitting: Gastroenterology

## 2018-08-29 SURGERY — COLONOSCOPY
Anesthesia: Moderate Sedation

## 2018-09-29 DIAGNOSIS — I10 Essential (primary) hypertension: Secondary | ICD-10-CM | POA: Diagnosis not present

## 2018-12-12 ENCOUNTER — Other Ambulatory Visit: Payer: Self-pay

## 2018-12-12 ENCOUNTER — Observation Stay (HOSPITAL_COMMUNITY)
Admission: EM | Admit: 2018-12-12 | Discharge: 2018-12-12 | Disposition: A | Discharge status: Home / Self Care | Payer: BLUE CROSS/BLUE SHIELD | Attending: Internal Medicine | Admitting: Internal Medicine

## 2018-12-12 ENCOUNTER — Encounter (HOSPITAL_COMMUNITY): Payer: Self-pay

## 2018-12-12 DIAGNOSIS — E785 Hyperlipidemia, unspecified: Secondary | ICD-10-CM | POA: Diagnosis present

## 2018-12-12 DIAGNOSIS — Z7982 Long term (current) use of aspirin: Secondary | ICD-10-CM | POA: Insufficient documentation

## 2018-12-12 DIAGNOSIS — R600 Localized edema: Secondary | ICD-10-CM | POA: Diagnosis not present

## 2018-12-12 DIAGNOSIS — Z7984 Long term (current) use of oral hypoglycemic drugs: Secondary | ICD-10-CM | POA: Insufficient documentation

## 2018-12-12 DIAGNOSIS — T783XXA Angioneurotic edema, initial encounter: Principal | ICD-10-CM | POA: Diagnosis present

## 2018-12-12 DIAGNOSIS — E119 Type 2 diabetes mellitus without complications: Secondary | ICD-10-CM | POA: Insufficient documentation

## 2018-12-12 DIAGNOSIS — I10 Essential (primary) hypertension: Secondary | ICD-10-CM | POA: Diagnosis not present

## 2018-12-12 DIAGNOSIS — T464X5A Adverse effect of angiotensin-converting-enzyme inhibitors, initial encounter: Secondary | ICD-10-CM

## 2018-12-12 DIAGNOSIS — E1169 Type 2 diabetes mellitus with other specified complication: Secondary | ICD-10-CM | POA: Diagnosis present

## 2018-12-12 DIAGNOSIS — Z79899 Other long term (current) drug therapy: Secondary | ICD-10-CM | POA: Insufficient documentation

## 2018-12-12 LAB — CBC WITH DIFFERENTIAL/PLATELET
Abs Immature Granulocytes: 0.03 10*3/uL (ref 0.00–0.07)
Basophils Absolute: 0 10*3/uL (ref 0.0–0.1)
Basophils Relative: 0 %
Eosinophils Absolute: 0.1 10*3/uL (ref 0.0–0.5)
Eosinophils Relative: 2 %
HCT: 43.6 % (ref 39.0–52.0)
Hemoglobin: 14.2 g/dL (ref 13.0–17.0)
Immature Granulocytes: 0 %
Lymphocytes Relative: 17 %
Lymphs Abs: 1.2 10*3/uL (ref 0.7–4.0)
MCH: 28.6 pg (ref 26.0–34.0)
MCHC: 32.6 g/dL (ref 30.0–36.0)
MCV: 87.9 fL (ref 80.0–100.0)
MONO ABS: 0.3 10*3/uL (ref 0.1–1.0)
Monocytes Relative: 4 %
Neutro Abs: 5.8 10*3/uL (ref 1.7–7.7)
Neutrophils Relative %: 77 %
Platelets: 165 10*3/uL (ref 150–400)
RBC: 4.96 MIL/uL (ref 4.22–5.81)
RDW: 13.1 % (ref 11.5–15.5)
WBC: 7.5 10*3/uL (ref 4.0–10.5)
nRBC: 0 % (ref 0.0–0.2)

## 2018-12-12 LAB — BASIC METABOLIC PANEL
Anion gap: 7 (ref 5–15)
BUN: 15 mg/dL (ref 8–23)
CO2: 24 mmol/L (ref 22–32)
Calcium: 9 mg/dL (ref 8.9–10.3)
Chloride: 104 mmol/L (ref 98–111)
Creatinine, Ser: 0.9 mg/dL (ref 0.61–1.24)
GFR calc Af Amer: >60 mL/min (ref >60)
GFR calc non Af Amer: >60 mL/min (ref >60)
Glucose, Bld: 165 mg/dL — ABNORMAL HIGH (ref 70–99)
Potassium: 4.1 mmol/L (ref 3.5–5.1)
Sodium: 135 mmol/L (ref 135–145)

## 2018-12-12 LAB — BPAM FFP
Blood Product Expiration Date: 202001222359
Unit Type and Rh: 5100

## 2018-12-12 LAB — GLUCOSE, CAPILLARY: Glucose-Capillary: 162 mg/dL — ABNORMAL HIGH (ref 70–99)

## 2018-12-12 LAB — TYPE AND SCREEN
ABO/RH(D): O POS
Antibody Screen: NEGATIVE

## 2018-12-12 LAB — PREPARE FRESH FROZEN PLASMA: Unit division: 0

## 2018-12-12 MED ORDER — HYDROCHLOROTHIAZIDE 25 MG PO TABS
25.0000 mg | ORAL_TABLET | Freq: Every day | ORAL | Status: DC
  Administered 2018-12-12: 25 mg via ORAL
  Filled 2018-12-12: qty 1

## 2018-12-12 MED ORDER — GLIPIZIDE ER 5 MG PO TB24: 10.0000 mg | ORAL_TABLET | Freq: Every day | ORAL | Status: DC

## 2018-12-12 MED ORDER — ASPIRIN EC 81 MG PO TBEC
81.0000 mg | DELAYED_RELEASE_TABLET | Freq: Every day | ORAL | Status: DC
  Administered 2018-12-12: 81 mg via ORAL
  Filled 2018-12-12: qty 1

## 2018-12-12 MED ORDER — SIMVASTATIN 20 MG PO TABS: 40.0000 mg | ORAL_TABLET | Freq: Every day | ORAL | Status: DC

## 2018-12-12 MED ORDER — ONDANSETRON HCL 4 MG PO TABS: 4.0000 mg | ORAL_TABLET | Freq: Four times a day (QID) | ORAL | Status: DC | PRN

## 2018-12-12 MED ORDER — SODIUM CHLORIDE 0.9 % IV SOLN: 250.0000 mL | INTRAVENOUS | Status: DC | PRN

## 2018-12-12 MED ORDER — SODIUM CHLORIDE 0.9% IV SOLUTION
Freq: Once | INTRAVENOUS | Status: AC
  Administered 2018-12-12: 08:00:00 via INTRAVENOUS

## 2018-12-12 MED ORDER — SODIUM CHLORIDE 0.9% FLUSH: 3.0000 mL | INTRAVENOUS | Status: DC | PRN

## 2018-12-12 MED ORDER — METFORMIN HCL 500 MG PO TABS
1000.0000 mg | ORAL_TABLET | Freq: Every day | ORAL | Status: DC
  Administered 2018-12-12: 1000 mg via ORAL
  Filled 2018-12-12: qty 2

## 2018-12-12 MED ORDER — FAMOTIDINE IN NACL 20-0.9 MG/50ML-% IV SOLN
20.0000 mg | Freq: Once | INTRAVENOUS | Status: AC
  Administered 2018-12-12: 20 mg via INTRAVENOUS
  Filled 2018-12-12: qty 50

## 2018-12-12 MED ORDER — ENOXAPARIN SODIUM 60 MG/0.6ML ~~LOC~~ SOLN
55.0000 mg | SUBCUTANEOUS | Status: DC
  Administered 2018-12-12: 55 mg via SUBCUTANEOUS
  Filled 2018-12-12: qty 0.6

## 2018-12-12 MED ORDER — METOPROLOL SUCCINATE ER 25 MG PO TB24
25.0000 mg | ORAL_TABLET | Freq: Every day | ORAL | Status: DC
  Administered 2018-12-12: 25 mg via ORAL
  Filled 2018-12-12: qty 1

## 2018-12-12 MED ORDER — DIPHENHYDRAMINE HCL 50 MG/ML IJ SOLN
25.0000 mg | Freq: Once | INTRAMUSCULAR | Status: AC
  Administered 2018-12-12: 25 mg via INTRAVENOUS
  Filled 2018-12-12: qty 1

## 2018-12-12 MED ORDER — ENOXAPARIN SODIUM 40 MG/0.4ML ~~LOC~~ SOLN: 40.0000 mg | SUBCUTANEOUS | Status: DC

## 2018-12-12 MED ORDER — ACETAMINOPHEN 325 MG PO TABS: 650.0000 mg | ORAL_TABLET | Freq: Four times a day (QID) | ORAL | Status: DC | PRN

## 2018-12-12 MED ORDER — SODIUM CHLORIDE 0.9% FLUSH: 3.0000 mL | Freq: Two times a day (BID) | INTRAVENOUS | Status: DC

## 2018-12-12 MED ORDER — HYDROCHLOROTHIAZIDE 25 MG PO TABS: 25.0000 mg | ORAL_TABLET | Freq: Every day | ORAL | 3 refills | Status: DC

## 2018-12-12 MED ORDER — ACETAMINOPHEN 650 MG RE SUPP: 650.0000 mg | Freq: Four times a day (QID) | RECTAL | Status: DC | PRN

## 2018-12-12 MED ORDER — METHYLPREDNISOLONE SODIUM SUCC 125 MG IJ SOLR
125.0000 mg | Freq: Once | INTRAMUSCULAR | Status: AC
  Administered 2018-12-12: 125 mg via INTRAVENOUS
  Filled 2018-12-12: qty 2

## 2018-12-12 MED ORDER — ONDANSETRON HCL 4 MG/2ML IJ SOLN: 4.0000 mg | Freq: Four times a day (QID) | INTRAMUSCULAR | Status: DC | PRN

## 2018-12-12 NOTE — Progress Notes (Signed)
Lip swelling greatly reduced from admission picture.  Continues to deny difficulty breathing.  IV removed and discharge instructions reviewed.  Family to drive home

## 2018-12-12 NOTE — ED Provider Notes (Signed)
[facility] EMERGENCY DEPARTMENT Provider Note   CSN: [id number] Arrival date & time: 12/12/18  9470  Time seen 5:40 AM   History   Chief Complaint Chief Complaint  Patient presents with  . Angioedema    HPI Christopher Sullivan is a 63 y.o. male.  HPI patient states he was at work tonight and somewhere around 330 to 4 AM he was rubbing his tongue on his lips because his lips did not feel right.  He then went to the bathroom and saw that his lips were swollen.  He denies any swelling of his tongue or throat.  He states his voice is his normal voice.  He denies any difficulty swallowing or breathing.  He denies any rash or itching.  He states he is never had this happen before.  He states he has been on lisinopril for about 5 years.  PCP Patient, No Pcp Per   Past Medical History:  Diagnosis Date  . Diabetes mellitus without complication ([place])   . GERD (gastroesophageal reflux disease)   . Hyperlipidemia   . Hypertension     Patient Active Problem List   Diagnosis Date Noted  . Angioedema 12/12/2018  . Dysphagia 03/13/2018  . Encounter for screening colonoscopy 03/13/2018  . Essential hypertension 02/11/2018  . Type 2 diabetes mellitus without complication, without long-term current use of insulin ([place]) 12/11/2017  . Hyperlipidemia associated with type 2 diabetes mellitus ([place]) 12/11/2017    Past Surgical History:  Procedure Laterality Date  . None          Home Medications    Prior to Admission medications   Medication Sig Start Date End Date Taking? Authorizing Provider  aspirin EC 81 MG tablet Take 81 mg by mouth daily.    [provider]  blood glucose meter kit and supplies KIT Dispense based on patient and insurance preference. Use up to four times daily as directed. (FOR ICD-9 250.00, 250.01). 01/29/18   [name], MD  glipiZIDE (GLUCOTROL XL) 10 MG 24 hr tablet Take 1 tablet (10 mg total) by mouth daily with breakfast. 08/18/18   [name], MD    glucose blood (CONTOUR NEXT TEST) test strip 1 each by Other route 2 (two) times daily. Use as instructed 01/28/18   [name], MD  lisinopril-hydrochlorothiazide (PRINZIDE,ZESTORETIC) 20-25 MG tablet Take 1 tablet by mouth daily. 08/18/18   [name], MD  Metformin HCl 500 MG/5ML SOLN Take 10 mLs (1,000 mg total) by mouth 2 (two) times daily. 07/01/18   [name], MD  metoprolol succinate (TOPROL-XL) 25 MG 24 hr tablet Take 1 tablet (25 mg total) by mouth daily. 08/18/18   [name], MD  polyethylene glycol-electrolytes (TRILYTE) 420 g solution Take 4,000 mLs by mouth as directed. 03/13/18   Fields, Marga Melnick, MD  simvastatin (ZOCOR) 40 MG tablet TAKE ONE (1) TABLET BY MOUTH EVERY DAY 08/14/18   [name], MD    Family History Family History  Problem Relation Age of Onset  . Hypertension Mother   . Diabetes Mother   . Arthritis Mother   . Heart disease Mother   . Miscarriages / [place] Mother   . Stroke Mother   . Depression Father   . Mental illness Father   . Lupus Sister   . Diabetes Daughter   . Hypertension Daughter   . Colon cancer Neg Hx   . Colon polyps Neg Hx     Social History Social History   Tobacco Use  . Smoking  status: Never Smoker  . Smokeless tobacco: Never Used  Substance Use Topics  . Alcohol use: No  . 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   Review of Systems Review of Systems  All other systems reviewed and are negative.    Physical Exam Updated Vital Signs BP (!) 157/86 (BP Location: Left Arm)   Pulse 79   Temp 98 F (36.7 C) (Oral)   Resp 20   Ht 6' (1.829 m)   Wt 110.2 kg   SpO2 100%   BMI 32.96 kg/m   Vital signs normal except for hypertension   Physical Exam Vitals signs and nursing note reviewed.  Constitutional:      Appearance: He is normal weight.  HENT:     Head: Normocephalic and atraumatic.     Right Ear: External ear normal.     Left Ear:  External ear normal.     Nose: Nose normal.     Mouth/Throat:     Mouth: Mucous membranes are moist.     Comments: Tongue is normal, there is no edema of the soft palate or posterior oropharynx.  Patient is noted to have marked diffuse swelling of his upper lip and the space between his lip and his nose.  He has minimal swelling of his lower lip on the right Eyes:     Extraocular Movements: Extraocular movements intact.     Conjunctiva/sclera: Conjunctivae normal.     Pupils: Pupils are equal, round, and reactive to light.  Neck:     Musculoskeletal: Normal range of motion and neck supple.  Cardiovascular:     Rate and Rhythm: Normal rate.  Pulmonary:     Effort: Pulmonary effort is normal. No respiratory distress.  Musculoskeletal: Normal range of motion.  Skin:    General: Skin is warm and dry.     Capillary Refill: Capillary refill takes less than 2 seconds.  Neurological:     General: No focal deficit present.     Mental Status: He is alert and oriented to person, place, and time.  Psychiatric:        Mood and Affect: Mood normal.        Behavior: Behavior normal.        Thought Content: Thought content normal.        ED Treatments / Results  Labs (all labs ordered are listed, but only abnormal results are displayed) Results for orders placed or performed during the hospital encounter of 34/19/37  Basic metabolic panel  Result Value Ref Range   Sodium 135 135 - 145 mmol/L   Potassium 4.1 3.5 - 5.1 mmol/L   Chloride 104 98 - 111 mmol/L   CO2 24 22 - 32 mmol/L   Glucose, Bld 165 (H) 70 - 99 mg/dL   BUN 15 8 - 23 mg/dL   Creatinine, Ser 0.90 0.61 - 1.24 mg/dL   Calcium 9.0 8.9 - 10.3 mg/dL   GFR calc non Af Amer >60 >60 mL/min   GFR calc Af Amer >60 >60 mL/min   Anion gap 7 5 - 15  CBC with Differential  Result Value Ref Range   WBC 7.5 4.0 - 10.5 K/uL   RBC 4.96 4.22 - 5.81 MIL/uL   Hemoglobin 14.2 13.0 - 17.0 g/dL   HCT 43.6 39.0 - 52.0 %   MCV 87.9 80.0 -  100.0 fL   MCH 28.6 26.0 - 34.0 pg   MCHC  32.6 30.0 - 36.0 g/dL   RDW 13.1 11.5 - 15.5 %   Platelets 165 150 - 400 K/uL   nRBC 0.0 0.0 - 0.2 %   Neutrophils Relative % 77 %   Neutro Abs 5.8 1.7 - 7.7 K/uL   Lymphocytes Relative 17 %   Lymphs Abs 1.2 0.7 - 4.0 K/uL   Monocytes Relative 4 %   Monocytes Absolute 0.3 0.1 - 1.0 K/uL   Eosinophils Relative 2 %   Eosinophils Absolute 0.1 0.0 - 0.5 K/uL   Basophils Relative 0 %   Basophils Absolute 0.0 0.0 - 0.1 K/uL   Immature Granulocytes 0 %   Abs Immature Granulocytes 0.03 0.00 - 0.07 K/uL  Type and screen [facility]  Result Value Ref Range   ABO/RH(D) PENDING    Antibody Screen PENDING    Sample Expiration      12/15/2018 Performed at [facility], [address], [place], [place] [address]    Laboratory interpretation all normal except hyperglycemia    EKG None  Radiology No results found.  Procedures .Critical Care Performed by: Rolland Porter, MD Authorized by: Rolland Porter, MD   Critical care provider statement:    Critical care time (minutes):  31   Critical care was necessary to treat or prevent imminent or life-threatening deterioration of the following conditions:  Respiratory failure   Critical care was time spent personally by me on the following activities:  Discussions with consultants, examination of patient, obtaining history from patient or surrogate, ordering and review of laboratory studies, pulse oximetry and re-evaluation of patient's condition   (including critical care time)  Medications Ordered in ED Medications  methylPREDNISolone sodium succinate (SOLU-MEDROL) 125 mg/2 mL injection 125 mg (125 mg Intravenous Given 12/12/18 0547)  diphenhydrAMINE (BENADRYL) injection 25 mg (25 mg Intravenous Given 12/12/18 0547)  famotidine (PEPCID) IVPB 20 mg premix (20 mg Intravenous New Bag/Given 12/12/18 0547)  0.9 %  sodium chloride infusion (Manually program via Guardrails IV Fluids) ( Intravenous New  Bag/Given 12/12/18 0811)     Initial Impression / Assessment and Plan / ED Course  I have reviewed the triage vital signs and the nursing notes.  Pertinent labs & imaging results that were available during my care of the patient were reviewed by me and considered in my medical decision making (see chart for details).      I discussed with the patient that the swelling is most likely from his blood pressure medication, lisinopril.  He was given IV Pepcid, Solu-Medrol, and Benadryl.  We discussed he should never be on that medication or any of the medications in the same class, ACE inhibitor's, in the future.   Recheck at 7:10 AM patient states his lips feel less tight however to me they still appear to be very swollen.  Wife is at the bedside and is questioning the etiology of this which I again explained was his blood pressure medication.  I am going to order FFP to try to improve his swelling.     8:08 AM Dr. Brigitte Pulse, hospitalist will admit for observation.  Final Clinical Impressions(s) / ED Diagnoses   Final diagnoses:  Angioedema, initial encounter  Angioedema due to angiotensin converting enzyme inhibitor (ACE-I)    Plan admission for observation  Rolland Porter, MD, Barbette Or, MD 12/12/18 [phone]

## 2018-12-12 NOTE — H&P (Signed)
History and Physical    Christopher Sullivan WUJ:[id number] DOB: 1956/09/26 DOA: 12/12/2018  PCP: Patient, No Pcp Per   Patient coming from: Home  Chief Complaint: Lip swelling  HPI: Christopher Sullivan is a 63 y.o. male with medical history significant for type 2 diabetes, GERD, dyslipidemia, and hypertension who takes lisinopril at home.  He presented to the ED after he noticed swelling to his lips while at work at approximately 4 AM.  He did not have any tongue swelling, nor did he have any trouble with his voice or breathing.  He did not have any trouble swallowing as well.  He has been taking lisinopril over the last 5 years.  No other trauma identified.   ED Course: Vital signs noted to be stable and patient was on room air with adequate oxygenation noted.  He was given IV Benadryl as well as famotidine and methylprednisolone.  He has also been ordered a transfusion of FFP to further improve the swelling.  At the time of my examination, patient is doing better, but continues have lip swelling and is quite concerned.  His blood pressure is otherwise stable.  Review of Systems: All others reviewed and otherwise negative.  Past Medical History:  Diagnosis Date  . Diabetes mellitus without complication ([place])   . GERD (gastroesophageal reflux disease)   . Hyperlipidemia   . Hypertension     Past Surgical History:  Procedure Laterality Date  . None       reports that he has never smoked. He has never used smokeless tobacco. He reports that he does not drink alcohol or use drugs.  No Known Allergies  Family History  Problem Relation Age of Onset  . Hypertension Mother   . Diabetes Mother   . Arthritis Mother   . Heart disease Mother   . Miscarriages / [place] Mother   . Stroke Mother   . Depression Father   . Mental illness Father   . Lupus Sister   . Diabetes Daughter   . Hypertension Daughter   . Colon cancer Neg Hx   . Colon polyps Neg Hx     Prior to Admission medications     Medication Sig Start Date End Date Taking? Authorizing Provider  aspirin EC 81 MG tablet Take 81 mg by mouth daily.   Yes [provider]  glipiZIDE (GLUCOTROL XL) 10 MG 24 hr tablet Take 1 tablet (10 mg total) by mouth daily with breakfast. 08/18/18  Yes Hagler, Apolonio Schneiders, MD  lisinopril-hydrochlorothiazide (PRINZIDE,ZESTORETIC) 20-25 MG tablet Take 1 tablet by mouth daily. 08/18/18  Yes [name], MD  metFORMIN (GLUCOPHAGE) 1000 MG tablet Take 1,000 mg by mouth daily.   Yes [provider]  metoprolol succinate (TOPROL-XL) 25 MG 24 hr tablet Take 1 tablet (25 mg total) by mouth daily. 08/18/18  Yes Hagler, Apolonio Schneiders, MD  simvastatin (ZOCOR) 40 MG tablet TAKE ONE (1) TABLET BY MOUTH EVERY DAY 08/14/18  Yes Hagler, Apolonio Schneiders, MD  polyethylene glycol-electrolytes (TRILYTE) 420 g solution Take 4,000 mLs by mouth as directed. Patient not taking: Reported on 12/12/2018 03/13/18   Danie Binder, MD    Physical Exam: Vitals:   12/12/18 0538 12/12/18 0600 12/12/18 0810 12/12/18 0941  BP: (!) 157/86 (!) 142/68 128/77 130/71  Pulse: 79 70 66 70  Resp: 20 15 15 19   Temp: 98 F (36.7 C)   98 F (36.7 C)  TempSrc: Oral   Oral  SpO2: 100% 100% 100% 97%  Weight:  Height:        Constitutional: NAD, calm, comfortable Vitals:   12/12/18 0538 12/12/18 0600 12/12/18 0810 12/12/18 0941  BP: (!) 157/86 (!) 142/68 128/77 130/71  Pulse: 79 70 66 70  Resp: 20 15 15 19   Temp: 98 F (36.7 C)   98 F (36.7 C)  TempSrc: Oral   Oral  SpO2: 100% 100% 100% 97%  Weight:      Height:       Eyes: lids and conjunctivae normal ENMT: Mucous membranes are moist.  Lip swelling noted with no tongue edema. Neck: normal, supple Respiratory: clear to auscultation bilaterally. Normal respiratory effort. No accessory muscle use.  Cardiovascular: Regular rate and rhythm, no murmurs. No extremity edema. Abdomen: no tenderness, no distention. Bowel sounds positive.  Musculoskeletal:  No joint  deformity upper and lower extremities.   Skin: no rashes, lesions, ulcers.  Psychiatric: Normal judgment and insight. Alert and oriented x 3. Normal mood.   Labs on Admission: I have personally reviewed following labs and imaging studies  CBC: Recent Labs  Lab 12/12/18 0745  WBC 7.5  NEUTROABS 5.8  HGB 14.2  HCT 43.6  MCV 87.9  PLT 983   Basic Metabolic Panel: Recent Labs  Lab 12/12/18 0745  NA 135  K 4.1  CL 104  CO2 24  GLUCOSE 165*  BUN 15  CREATININE 0.90  CALCIUM 9.0   GFR: Estimated Creatinine Clearance: 107.7 mL/min (by C-G formula based on SCr of 0.9 mg/dL). Liver Function Tests: No results for input(s): AST, ALT, ALKPHOS, BILITOT, PROT, ALBUMIN in the last 168 hours. No results for input(s): LIPASE, AMYLASE in the last 168 hours. No results for input(s): AMMONIA in the last 168 hours. Coagulation Profile: No results for input(s): INR, PROTIME in the last 168 hours. Cardiac Enzymes: No results for input(s): CKTOTAL, CKMB, CKMBINDEX, TROPONINI in the last 168 hours. BNP (last 3 results) No results for input(s): PROBNP in the last 8760 hours. HbA1C: No results for input(s): HGBA1C in the last 72 hours. CBG: Recent Labs  Lab 12/12/18 1009  GLUCAP 162*   Lipid Profile: No results for input(s): CHOL, HDL, LDLCALC, TRIG, CHOLHDL, LDLDIRECT in the last 72 hours. Thyroid Function Tests: No results for input(s): TSH, T4TOTAL, FREET4, T3FREE, THYROIDAB in the last 72 hours. Anemia Panel: No results for input(s): VITAMINB12, FOLATE, FERRITIN, TIBC, IRON, RETICCTPCT in the last 72 hours. Urine analysis: No results found for: COLORURINE, APPEARANCEUR, LABSPEC, PHURINE, GLUCOSEU, HGBUR, BILIRUBINUR, KETONESUR, PROTEINUR, UROBILINOGEN, NITRITE, LEUKOCYTESUR  Radiological Exams on Admission: No results found.  Assessment/Plan Principal Problem:   Angioedema Active Problems:   Hyperlipidemia associated with type 2 diabetes mellitus ([place])   Essential  hypertension    1. Angioedema secondary to ACE inhibitor use.  Will monitor closely for any worsening signs or symptoms, but at this time he already appears to be improving and may be a candidate for discharge in the next few hours.  Will discontinue home lisinopril and restart other home medications as prior and start a diet.  2. Essential hypertension.  Resume other home blood pressure medications with the exception of lisinopril and started on amlodipine outpatient as blood pressure tolerates.  Will need follow-up with his PCP to ensure that blood pressure remains well controlled. 3. Type 2 diabetes.  Patient does have some mild hyperglycemia but is otherwise controlled.  Resume home medications and maintain on carb modified diet. 4. Dyslipidemia.  Maintain on statin.  Follow-up outpatient.   DVT prophylaxis: Lovenox Code Status:  Full Family Communication: Wife at bedside Disposition Plan: Plan to observe angioedema after administration of medications in ED and anticipate DC soon, discontinue lisinopril Consults called: None Admission status: Observation, MedSurg   Dianna Deshler Darleen Crocker DO [facility] Pager [phone]  If 7PM-7AM, please contact night-coverage [email or web address] Password [email or web address]  12/12/2018, 12:30 PM

## 2018-12-12 NOTE — Discharge Summary (Signed)
Physician Discharge Summary  Christopher Sullivan YWV:[id number] DOB: 03/07/1956 DOA: 12/12/2018  PCP: Patient, No Pcp Per  Admit date: 12/12/2018  Discharge date: 12/12/2018  Admitted From: Home  Disposition: Home  Recommendations for Outpatient Follow-up:  1. Follow up with PCP in 5 to 7 days to recheck blood pressure in office 2. Lisinopril discontinued during the stay with soft blood pressure readings 3. Resume all other home medications as otherwise prescribed.  Home Health: None  Equipment/Devices: None  Discharge Condition: Stable  CODE STATUS: Full  Diet recommendation: Heart Healthy/carb modified  Brief/Interim Summary: Per HPI: Christopher Sullivan is a 63 y.o. male with medical history significant for type 2 diabetes, GERD, dyslipidemia, and hypertension who takes lisinopril at home.  He presented to the ED after he noticed swelling to his lips while at work at approximately 4 AM.  He did not have any tongue swelling, nor did he have any trouble with his voice or breathing.  He did not have any trouble swallowing as well.  He has been taking lisinopril over the last 5 years.  No other trauma identified.  Patient was admitted with angioedema likely secondary to ACE inhibitor use which has now been discontinued.  He has improved rapidly during this observation.  With the use of IV methylprednisolone as well as famotidine and Benadryl.  He has not required FFP or any other measures and is able to eat and drink as well as breathe without any stridor or wheezing.  He has actually had no respiratory distress during his entire visit.  His swelling has resolved dramatically and he is ready for discharge at this time.  His ACE inhibitor will be discontinued and there will be no blood pressure medication replacement as his blood pressures are currently well controlled.  He has been encouraged to follow-up with his PCP in the next several days to ensure that his blood pressures remain stable and to start other  medications as needed.  Discharge Diagnoses:  Principal Problem:   Angioedema Active Problems:   Hyperlipidemia associated with type 2 diabetes mellitus ([place])   Essential hypertension  Principal discharge diagnosis: Angioedema secondary to ACE inhibitor use.  Discharge Instructions  Discharge Instructions    Diet - low sodium heart healthy   Complete by:  As directed    Increase activity slowly   Complete by:  As directed      Allergies as of 12/12/2018   No Known Allergies     Medication List    STOP taking these medications   lisinopril-hydrochlorothiazide 20-25 MG tablet Commonly known as:  PRINZIDE,ZESTORETIC     TAKE these medications   aspirin EC 81 MG tablet Take 81 mg by mouth daily.   glipiZIDE 10 MG 24 hr tablet Commonly known as:  GLUCOTROL XL Take 1 tablet (10 mg total) by mouth daily with breakfast.   hydrochlorothiazide 25 MG tablet Commonly known as:  HYDRODIURIL Take 1 tablet (25 mg total) by mouth daily for 30 days. Start taking on:  December 13, 2018   metFORMIN 1000 MG tablet Commonly known as:  GLUCOPHAGE Take 1,000 mg by mouth daily.   metoprolol succinate 25 MG 24 hr tablet Commonly known as:  TOPROL-XL Take 1 tablet (25 mg total) by mouth daily.   polyethylene glycol-electrolytes 420 g solution Commonly known as:  TRILYTE Take 4,000 mLs by mouth as directed.   simvastatin 40 MG tablet Commonly known as:  ZOCOR TAKE ONE (1) TABLET BY MOUTH EVERY DAY  Follow-up Information    [name], MD. Schedule an appointment as soon as possible for a visit in 5 day(s).   Specialty:  Family Medicine Why:  Recheck BP in office. Contact information: [place] [phone]          No Known Allergies  Consultations:  None   Procedures/Studies:  No results found.  Discharge Exam: Vitals:   12/12/18 0941 12/12/18 1331  BP: 130/71 113/63  Pulse: 70 87  Resp: 19 20  Temp: 98 F (36.7  C) 98 F (36.7 C)  SpO2: 97% 97%   Vitals:   12/12/18 0600 12/12/18 0810 12/12/18 0941 12/12/18 1331  BP: (!) 142/68 128/77 130/71 113/63  Pulse: 70 66 70 87  Resp: 15 15 19 20   Temp:   98 F (36.7 C) 98 F (36.7 C)  TempSrc:   Oral Oral  SpO2: 100% 100% 97% 97%  Weight:      Height:        General: Pt is alert, awake, not in acute distress Cardiovascular: RRR, S1/S2 +, no rubs, no gallops Respiratory: CTA bilaterally, no wheezing, no rhonchi Abdominal: Soft, NT, ND, bowel sounds + Extremities: no edema, no cyanosis    The results of significant diagnostics from this hospitalization (including imaging, microbiology, ancillary and laboratory) are listed below for reference.     Microbiology: No results found for this or any previous visit (from the past 240 hour(s)).   Labs: BNP (last 3 results) No results for input(s): BNP in the last 8760 hours. Basic Metabolic Panel: Recent Labs  Lab 12/12/18 0745  NA 135  K 4.1  CL 104  CO2 24  GLUCOSE 165*  BUN 15  CREATININE 0.90  CALCIUM 9.0   Liver Function Tests: No results for input(s): AST, ALT, ALKPHOS, BILITOT, PROT, ALBUMIN in the last 168 hours. No results for input(s): LIPASE, AMYLASE in the last 168 hours. No results for input(s): AMMONIA in the last 168 hours. CBC: Recent Labs  Lab 12/12/18 0745  WBC 7.5  NEUTROABS 5.8  HGB 14.2  HCT 43.6  MCV 87.9  PLT 165   Cardiac Enzymes: No results for input(s): CKTOTAL, CKMB, CKMBINDEX, TROPONINI in the last 168 hours. BNP: Invalid input(s): POCBNP CBG: Recent Labs  Lab 12/12/18 1009  GLUCAP 162*   D-Dimer No results for input(s): DDIMER in the last 72 hours. Hgb A1c No results for input(s): HGBA1C in the last 72 hours. Lipid Profile No results for input(s): CHOL, HDL, LDLCALC, TRIG, CHOLHDL, LDLDIRECT in the last 72 hours. Thyroid function studies No results for input(s): TSH, T4TOTAL, T3FREE, THYROIDAB in the last 72 hours.  Invalid input(s):  FREET3 Anemia work up No results for input(s): VITAMINB12, FOLATE, FERRITIN, TIBC, IRON, RETICCTPCT in the last 72 hours. Urinalysis No results found for: COLORURINE, APPEARANCEUR, LABSPEC, [place], GLUCOSEU, HGBUR, BILIRUBINUR, KETONESUR, PROTEINUR, UROBILINOGEN, NITRITE, LEUKOCYTESUR Sepsis Labs Invalid input(s): PROCALCITONIN,  WBC,  LACTICIDVEN Microbiology No results found for this or any previous visit (from the past 240 hour(s)).   Time coordinating discharge: 35 minutes  SIGNED:   Rodena Goldmann, DO [facility] 12/12/2018, 2:17 PM Pager [phone]  If 7PM-7AM, please contact night-coverage [email or web address] Password [email or web address]

## 2018-12-12 NOTE — ED Triage Notes (Addendum)
Pt states he was at work tonight and started having lip swelling.  Pt denies pain, sob, or swelling of tongue.  Pt states he takes lisinopril

## 2018-12-13 LAB — HIV ANTIBODY (ROUTINE TESTING W REFLEX): HIV Screen 4th Generation wRfx: NONREACTIVE

## 2018-12-23 DIAGNOSIS — E782 Mixed hyperlipidemia: Secondary | ICD-10-CM | POA: Diagnosis not present

## 2018-12-23 DIAGNOSIS — E1169 Type 2 diabetes mellitus with other specified complication: Secondary | ICD-10-CM | POA: Diagnosis not present

## 2018-12-23 DIAGNOSIS — Z1211 Encounter for screening for malignant neoplasm of colon: Secondary | ICD-10-CM | POA: Diagnosis not present

## 2018-12-23 DIAGNOSIS — I10 Essential (primary) hypertension: Secondary | ICD-10-CM | POA: Diagnosis not present

## 2018-12-23 DIAGNOSIS — E1165 Type 2 diabetes mellitus with hyperglycemia: Secondary | ICD-10-CM | POA: Diagnosis not present

## 2018-12-26 ENCOUNTER — Emergency Department (HOSPITAL_COMMUNITY)
Admission: EM | Admit: 2018-12-26 | Discharge: 2018-12-27 | Disposition: A | Discharge status: Home / Self Care | Payer: BLUE CROSS/BLUE SHIELD | Attending: Emergency Medicine | Admitting: Emergency Medicine

## 2018-12-26 ENCOUNTER — Encounter (HOSPITAL_COMMUNITY): Payer: Self-pay | Admitting: Emergency Medicine

## 2018-12-26 DIAGNOSIS — Z23 Encounter for immunization: Secondary | ICD-10-CM | POA: Diagnosis not present

## 2018-12-26 DIAGNOSIS — T148XXA Other injury of unspecified body region, initial encounter: Secondary | ICD-10-CM

## 2018-12-26 DIAGNOSIS — Y999 Unspecified external cause status: Secondary | ICD-10-CM | POA: Diagnosis not present

## 2018-12-26 DIAGNOSIS — M5489 Other dorsalgia: Secondary | ICD-10-CM | POA: Diagnosis not present

## 2018-12-26 DIAGNOSIS — M545 Low back pain: Secondary | ICD-10-CM | POA: Insufficient documentation

## 2018-12-26 DIAGNOSIS — R52 Pain, unspecified: Secondary | ICD-10-CM | POA: Diagnosis not present

## 2018-12-26 DIAGNOSIS — Y939 Activity, unspecified: Secondary | ICD-10-CM | POA: Diagnosis not present

## 2018-12-26 DIAGNOSIS — S1081XA Abrasion of other specified part of neck, initial encounter: Secondary | ICD-10-CM | POA: Diagnosis not present

## 2018-12-26 DIAGNOSIS — M546 Pain in thoracic spine: Secondary | ICD-10-CM | POA: Diagnosis not present

## 2018-12-26 DIAGNOSIS — S0990XA Unspecified injury of head, initial encounter: Secondary | ICD-10-CM | POA: Diagnosis not present

## 2018-12-26 DIAGNOSIS — S0081XA Abrasion of other part of head, initial encounter: Secondary | ICD-10-CM | POA: Diagnosis not present

## 2018-12-26 DIAGNOSIS — R51 Headache: Secondary | ICD-10-CM | POA: Diagnosis not present

## 2018-12-26 DIAGNOSIS — S299XXA Unspecified injury of thorax, initial encounter: Secondary | ICD-10-CM | POA: Diagnosis not present

## 2018-12-26 DIAGNOSIS — M542 Cervicalgia: Secondary | ICD-10-CM | POA: Diagnosis not present

## 2018-12-26 DIAGNOSIS — S3992XA Unspecified injury of lower back, initial encounter: Secondary | ICD-10-CM | POA: Diagnosis not present

## 2018-12-26 DIAGNOSIS — I10 Essential (primary) hypertension: Secondary | ICD-10-CM | POA: Diagnosis not present

## 2018-12-26 DIAGNOSIS — Z7984 Long term (current) use of oral hypoglycemic drugs: Secondary | ICD-10-CM | POA: Insufficient documentation

## 2018-12-26 DIAGNOSIS — S1091XA Abrasion of unspecified part of neck, initial encounter: Secondary | ICD-10-CM | POA: Diagnosis not present

## 2018-12-26 DIAGNOSIS — Y929 Unspecified place or not applicable: Secondary | ICD-10-CM | POA: Insufficient documentation

## 2018-12-26 DIAGNOSIS — S199XXA Unspecified injury of neck, initial encounter: Secondary | ICD-10-CM | POA: Diagnosis not present

## 2018-12-26 DIAGNOSIS — E119 Type 2 diabetes mellitus without complications: Secondary | ICD-10-CM | POA: Diagnosis not present
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
## 2018-12-27 ENCOUNTER — Emergency Department (HOSPITAL_COMMUNITY): Payer: BLUE CROSS/BLUE SHIELD

## 2018-12-27 ENCOUNTER — Encounter (HOSPITAL_COMMUNITY): Payer: Self-pay | Admitting: Emergency Medicine

## 2018-12-27 DIAGNOSIS — S199XXA Unspecified injury of neck, initial encounter: Secondary | ICD-10-CM | POA: Diagnosis not present

## 2018-12-27 DIAGNOSIS — S0990XA Unspecified injury of head, initial encounter: Secondary | ICD-10-CM | POA: Diagnosis not present

## 2018-12-27 DIAGNOSIS — M545 Low back pain: Secondary | ICD-10-CM | POA: Diagnosis not present

## 2018-12-27 DIAGNOSIS — S3992XA Unspecified injury of lower back, initial encounter: Secondary | ICD-10-CM | POA: Diagnosis not present

## 2018-12-27 DIAGNOSIS — S299XXA Unspecified injury of thorax, initial encounter: Secondary | ICD-10-CM | POA: Diagnosis not present

## 2018-12-27 DIAGNOSIS — M542 Cervicalgia: Secondary | ICD-10-CM | POA: Diagnosis not present

## 2018-12-27 MED ORDER — KETOROLAC TROMETHAMINE 30 MG/ML IJ SOLN
15.0000 mg | Freq: Once | INTRAMUSCULAR | Status: AC
  Administered 2018-12-27: 15 mg via INTRAVENOUS
  Filled 2018-12-27: qty 1

## 2018-12-27 MED ORDER — TETANUS-DIPHTH-ACELL PERTUSSIS 5-2.5-18.5 LF-MCG/0.5 IM SUSP
0.5000 mL | Freq: Once | INTRAMUSCULAR | Status: AC
  Administered 2018-12-27: 0.5 mL via INTRAMUSCULAR
  Filled 2018-12-27: qty 0.5

## 2018-12-27 MED ORDER — DICLOFENAC SODIUM ER 100 MG PO TB24: 100.0000 mg | ORAL_TABLET | Freq: Every day | ORAL | 0 refills | Status: DC

## 2018-12-27 MED ORDER — METHOCARBAMOL 500 MG PO TABS: 500.0000 mg | ORAL_TABLET | Freq: Two times a day (BID) | ORAL | 0 refills | Status: DC

## 2018-12-27 NOTE — ED Notes (Signed)
Patient transported to X-ray 

## 2018-12-27 NOTE — ED Notes (Signed)
Reviewed d/c instructions with pt, who verbalized understanding and had no outstanding questions. Pt departed in NAD.   

## 2018-12-29 ENCOUNTER — Other Ambulatory Visit (HOSPITAL_COMMUNITY): Payer: Self-pay | Admitting: Family Medicine

## 2018-12-29 ENCOUNTER — Other Ambulatory Visit: Payer: Self-pay | Admitting: Family Medicine

## 2018-12-29 DIAGNOSIS — I10 Essential (primary) hypertension: Secondary | ICD-10-CM | POA: Diagnosis not present

## 2018-12-29 DIAGNOSIS — E041 Nontoxic single thyroid nodule: Secondary | ICD-10-CM | POA: Diagnosis not present

## 2018-12-29 DIAGNOSIS — S161XXA Strain of muscle, fascia and tendon at neck level, initial encounter: Secondary | ICD-10-CM | POA: Diagnosis not present

## 2019-01-05 ENCOUNTER — Ambulatory Visit (HOSPITAL_COMMUNITY)
Admission: RE | Admit: 2019-01-05 | Discharge: 2019-01-05 | Disposition: A | Discharge status: Home / Self Care | Payer: BLUE CROSS/BLUE SHIELD | Source: Ambulatory Visit | Attending: Family Medicine | Admitting: Family Medicine

## 2019-01-05 DIAGNOSIS — E041 Nontoxic single thyroid nodule: Secondary | ICD-10-CM | POA: Insufficient documentation

## 2019-01-05 DIAGNOSIS — E042 Nontoxic multinodular goiter: Secondary | ICD-10-CM | POA: Diagnosis not present

## 2019-01-14 DIAGNOSIS — M546 Pain in thoracic spine: Secondary | ICD-10-CM | POA: Diagnosis not present

## 2019-01-14 DIAGNOSIS — M545 Low back pain: Secondary | ICD-10-CM | POA: Diagnosis not present

## 2019-01-14 DIAGNOSIS — M542 Cervicalgia: Secondary | ICD-10-CM | POA: Diagnosis not present

## 2019-01-14 DIAGNOSIS — M25561 Pain in right knee: Secondary | ICD-10-CM | POA: Diagnosis not present

## 2019-01-15 DIAGNOSIS — M546 Pain in thoracic spine: Secondary | ICD-10-CM | POA: Diagnosis not present

## 2019-01-15 DIAGNOSIS — M545 Low back pain: Secondary | ICD-10-CM | POA: Diagnosis not present

## 2019-01-15 DIAGNOSIS — M542 Cervicalgia: Secondary | ICD-10-CM | POA: Diagnosis not present

## 2019-01-15 DIAGNOSIS — M25561 Pain in right knee: Secondary | ICD-10-CM | POA: Diagnosis not present

## 2019-01-19 DIAGNOSIS — M542 Cervicalgia: Secondary | ICD-10-CM | POA: Diagnosis not present

## 2019-01-19 DIAGNOSIS — M545 Low back pain: Secondary | ICD-10-CM | POA: Diagnosis not present

## 2019-01-19 DIAGNOSIS — M546 Pain in thoracic spine: Secondary | ICD-10-CM | POA: Diagnosis not present

## 2019-01-19 DIAGNOSIS — M25561 Pain in right knee: Secondary | ICD-10-CM | POA: Diagnosis not present

## 2019-01-21 DIAGNOSIS — M545 Low back pain: Secondary | ICD-10-CM | POA: Diagnosis not present

## 2019-01-21 DIAGNOSIS — M25561 Pain in right knee: Secondary | ICD-10-CM | POA: Diagnosis not present

## 2019-01-21 DIAGNOSIS — M546 Pain in thoracic spine: Secondary | ICD-10-CM | POA: Diagnosis not present

## 2019-01-21 DIAGNOSIS — M542 Cervicalgia: Secondary | ICD-10-CM | POA: Diagnosis not present

## 2019-01-23 DIAGNOSIS — E782 Mixed hyperlipidemia: Secondary | ICD-10-CM | POA: Diagnosis not present

## 2019-01-23 DIAGNOSIS — E1165 Type 2 diabetes mellitus with hyperglycemia: Secondary | ICD-10-CM | POA: Diagnosis not present

## 2019-01-23 DIAGNOSIS — M545 Low back pain: Secondary | ICD-10-CM | POA: Diagnosis not present

## 2019-01-23 DIAGNOSIS — M25561 Pain in right knee: Secondary | ICD-10-CM | POA: Diagnosis not present

## 2019-01-28 DIAGNOSIS — M545 Low back pain: Secondary | ICD-10-CM | POA: Diagnosis not present

## 2019-01-28 DIAGNOSIS — M542 Cervicalgia: Secondary | ICD-10-CM | POA: Diagnosis not present

## 2019-01-28 DIAGNOSIS — M546 Pain in thoracic spine: Secondary | ICD-10-CM | POA: Diagnosis not present

## 2019-01-28 DIAGNOSIS — M25561 Pain in right knee: Secondary | ICD-10-CM | POA: Diagnosis not present

## 2019-01-29 ENCOUNTER — Ambulatory Visit (INDEPENDENT_AMBULATORY_CARE_PROVIDER_SITE_OTHER): Payer: Self-pay

## 2019-01-29 ENCOUNTER — Ambulatory Visit (INDEPENDENT_AMBULATORY_CARE_PROVIDER_SITE_OTHER): Payer: BLUE CROSS/BLUE SHIELD | Admitting: Orthopaedic Surgery

## 2019-01-29 ENCOUNTER — Encounter (INDEPENDENT_AMBULATORY_CARE_PROVIDER_SITE_OTHER): Payer: Self-pay | Admitting: Orthopaedic Surgery

## 2019-01-29 ENCOUNTER — Ambulatory Visit (INDEPENDENT_AMBULATORY_CARE_PROVIDER_SITE_OTHER): Payer: BLUE CROSS/BLUE SHIELD

## 2019-01-29 VITALS — BP 136/76 | HR 80 | Ht 72.0 in | Wt 243.0 lb

## 2019-01-29 DIAGNOSIS — M25562 Pain in left knee: Secondary | ICD-10-CM | POA: Diagnosis not present

## 2019-01-29 DIAGNOSIS — M25561 Pain in right knee: Secondary | ICD-10-CM | POA: Diagnosis not present
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
## 2019-01-30 DIAGNOSIS — M545 Low back pain: Secondary | ICD-10-CM | POA: Diagnosis not present

## 2019-01-30 DIAGNOSIS — M25561 Pain in right knee: Secondary | ICD-10-CM | POA: Diagnosis not present

## 2019-01-30 DIAGNOSIS — M542 Cervicalgia: Secondary | ICD-10-CM | POA: Diagnosis not present

## 2019-01-30 DIAGNOSIS — M546 Pain in thoracic spine: Secondary | ICD-10-CM | POA: Diagnosis not present

## 2019-01-31 ENCOUNTER — Encounter (INDEPENDENT_AMBULATORY_CARE_PROVIDER_SITE_OTHER): Payer: Self-pay | Admitting: Orthopaedic Surgery

## 2019-02-02 DIAGNOSIS — M25561 Pain in right knee: Secondary | ICD-10-CM | POA: Diagnosis not present

## 2019-02-02 DIAGNOSIS — M545 Low back pain: Secondary | ICD-10-CM | POA: Diagnosis not present

## 2019-02-02 DIAGNOSIS — M542 Cervicalgia: Secondary | ICD-10-CM | POA: Diagnosis not present

## 2019-02-02 DIAGNOSIS — M546 Pain in thoracic spine: Secondary | ICD-10-CM | POA: Diagnosis not present

## 2019-02-03 DIAGNOSIS — M546 Pain in thoracic spine: Secondary | ICD-10-CM | POA: Diagnosis not present

## 2019-02-03 DIAGNOSIS — M545 Low back pain: Secondary | ICD-10-CM | POA: Diagnosis not present

## 2019-02-03 DIAGNOSIS — M542 Cervicalgia: Secondary | ICD-10-CM | POA: Diagnosis not present

## 2019-02-03 DIAGNOSIS — M25561 Pain in right knee: Secondary | ICD-10-CM | POA: Diagnosis not present

## 2019-02-05 DIAGNOSIS — M546 Pain in thoracic spine: Secondary | ICD-10-CM | POA: Diagnosis not present

## 2019-02-05 DIAGNOSIS — M542 Cervicalgia: Secondary | ICD-10-CM | POA: Diagnosis not present

## 2019-02-05 DIAGNOSIS — M25561 Pain in right knee: Secondary | ICD-10-CM | POA: Diagnosis not present

## 2019-02-05 DIAGNOSIS — M545 Low back pain: Secondary | ICD-10-CM | POA: Diagnosis not present

## 2019-02-09 DIAGNOSIS — M545 Low back pain: Secondary | ICD-10-CM | POA: Diagnosis not present

## 2019-02-09 DIAGNOSIS — M542 Cervicalgia: Secondary | ICD-10-CM | POA: Diagnosis not present

## 2019-02-09 DIAGNOSIS — M25561 Pain in right knee: Secondary | ICD-10-CM | POA: Diagnosis not present

## 2019-02-09 DIAGNOSIS — M546 Pain in thoracic spine: Secondary | ICD-10-CM | POA: Diagnosis not present

## 2019-02-10 ENCOUNTER — Telehealth (INDEPENDENT_AMBULATORY_CARE_PROVIDER_SITE_OTHER): Payer: Self-pay | Admitting: Radiology

## 2019-02-10 NOTE — Telephone Encounter (Signed)
Received voicemail from patient's wife requesting return call in regards to disability paperwork that they left in [place] to be completed.  [phone]

## 2019-02-10 NOTE — Telephone Encounter (Signed)
Dr. Lorin Mercy called and spoke with patient's wife. We did not take the patient out of work and therefore cannot complete the short term disability form. It was explained that whomever took the patient out of work initially and for that long would be the physician to complete the forms. Patient is to follow up in the office on Thursday, which was originally scheduled. Patient's wife expressed understanding.

## 2019-02-11 DIAGNOSIS — M25561 Pain in right knee: Secondary | ICD-10-CM | POA: Diagnosis not present

## 2019-02-11 DIAGNOSIS — M546 Pain in thoracic spine: Secondary | ICD-10-CM | POA: Diagnosis not present

## 2019-02-11 DIAGNOSIS — M542 Cervicalgia: Secondary | ICD-10-CM | POA: Diagnosis not present

## 2019-02-11 DIAGNOSIS — M545 Low back pain: Secondary | ICD-10-CM | POA: Diagnosis not present

## 2019-02-12 ENCOUNTER — Ambulatory Visit (INDEPENDENT_AMBULATORY_CARE_PROVIDER_SITE_OTHER): Payer: BLUE CROSS/BLUE SHIELD | Admitting: Orthopaedic Surgery

## 2019-02-12 ENCOUNTER — Encounter (INDEPENDENT_AMBULATORY_CARE_PROVIDER_SITE_OTHER): Payer: Self-pay | Admitting: Orthopaedic Surgery

## 2019-02-12 ENCOUNTER — Other Ambulatory Visit: Payer: Self-pay

## 2019-02-12 VITALS — Ht 72.0 in | Wt 243.0 lb

## 2019-02-12 DIAGNOSIS — S161XXD Strain of muscle, fascia and tendon at neck level, subsequent encounter: Secondary | ICD-10-CM

## 2019-02-12 DIAGNOSIS — Y92413 State road as the place of occurrence of the external cause: Secondary | ICD-10-CM

## 2019-02-12 DIAGNOSIS — S8001XD Contusion of right knee, subsequent encounter: Secondary | ICD-10-CM | POA: Diagnosis not present

## 2019-02-12 DIAGNOSIS — M170 Bilateral primary osteoarthritis of knee: Secondary | ICD-10-CM | POA: Insufficient documentation

## 2019-02-12 DIAGNOSIS — E119 Type 2 diabetes mellitus without complications: Secondary | ICD-10-CM | POA: Diagnosis not present
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
## 2019-02-13 DIAGNOSIS — M542 Cervicalgia: Secondary | ICD-10-CM | POA: Diagnosis not present

## 2019-02-13 DIAGNOSIS — M545 Low back pain: Secondary | ICD-10-CM | POA: Diagnosis not present

## 2019-02-13 DIAGNOSIS — M546 Pain in thoracic spine: Secondary | ICD-10-CM | POA: Diagnosis not present

## 2019-02-13 DIAGNOSIS — M25561 Pain in right knee: Secondary | ICD-10-CM | POA: Diagnosis not present

## 2019-02-17 DIAGNOSIS — M545 Low back pain: Secondary | ICD-10-CM | POA: Diagnosis not present

## 2019-02-17 DIAGNOSIS — M25561 Pain in right knee: Secondary | ICD-10-CM | POA: Diagnosis not present

## 2019-02-17 DIAGNOSIS — M546 Pain in thoracic spine: Secondary | ICD-10-CM | POA: Diagnosis not present

## 2019-02-17 DIAGNOSIS — M542 Cervicalgia: Secondary | ICD-10-CM | POA: Diagnosis not present

## 2019-02-18 ENCOUNTER — Other Ambulatory Visit: Payer: Self-pay | Admitting: Surgery

## 2019-02-18 DIAGNOSIS — E041 Nontoxic single thyroid nodule: Secondary | ICD-10-CM

## 2019-02-19 DIAGNOSIS — M542 Cervicalgia: Secondary | ICD-10-CM | POA: Diagnosis not present

## 2019-02-19 DIAGNOSIS — M25561 Pain in right knee: Secondary | ICD-10-CM | POA: Diagnosis not present

## 2019-02-19 DIAGNOSIS — M545 Low back pain: Secondary | ICD-10-CM | POA: Diagnosis not present

## 2019-02-19 DIAGNOSIS — M546 Pain in thoracic spine: Secondary | ICD-10-CM | POA: Diagnosis not present

## 2019-02-20 DIAGNOSIS — M546 Pain in thoracic spine: Secondary | ICD-10-CM | POA: Diagnosis not present

## 2019-02-20 DIAGNOSIS — M542 Cervicalgia: Secondary | ICD-10-CM | POA: Diagnosis not present

## 2019-02-20 DIAGNOSIS — M545 Low back pain: Secondary | ICD-10-CM | POA: Diagnosis not present

## 2019-02-20 DIAGNOSIS — M25561 Pain in right knee: Secondary | ICD-10-CM | POA: Diagnosis not present

## 2019-02-23 DIAGNOSIS — M545 Low back pain: Secondary | ICD-10-CM | POA: Diagnosis not present

## 2019-02-23 DIAGNOSIS — M546 Pain in thoracic spine: Secondary | ICD-10-CM | POA: Diagnosis not present

## 2019-02-23 DIAGNOSIS — M25561 Pain in right knee: Secondary | ICD-10-CM | POA: Diagnosis not present

## 2019-02-23 DIAGNOSIS — M542 Cervicalgia: Secondary | ICD-10-CM | POA: Diagnosis not present

## 2019-02-25 DIAGNOSIS — M542 Cervicalgia: Secondary | ICD-10-CM | POA: Diagnosis not present

## 2019-02-25 DIAGNOSIS — M546 Pain in thoracic spine: Secondary | ICD-10-CM | POA: Diagnosis not present

## 2019-02-25 DIAGNOSIS — M25561 Pain in right knee: Secondary | ICD-10-CM | POA: Diagnosis not present

## 2019-02-25 DIAGNOSIS — M545 Low back pain: Secondary | ICD-10-CM | POA: Diagnosis not present

## 2019-03-24 DIAGNOSIS — E782 Mixed hyperlipidemia: Secondary | ICD-10-CM | POA: Diagnosis not present

## 2019-03-24 DIAGNOSIS — E1169 Type 2 diabetes mellitus with other specified complication: Secondary | ICD-10-CM | POA: Diagnosis not present

## 2019-03-24 DIAGNOSIS — I10 Essential (primary) hypertension: Secondary | ICD-10-CM | POA: Diagnosis not present

## 2019-04-15 ENCOUNTER — Other Ambulatory Visit: Payer: BLUE CROSS/BLUE SHIELD

## 2019-04-30 ENCOUNTER — Other Ambulatory Visit (HOSPITAL_COMMUNITY): Payer: Self-pay | Admitting: Family Medicine

## 2019-04-30 DIAGNOSIS — E041 Nontoxic single thyroid nodule: Secondary | ICD-10-CM

## 2019-05-07 ENCOUNTER — Other Ambulatory Visit: Payer: Self-pay

## 2019-05-07 ENCOUNTER — Ambulatory Visit (HOSPITAL_COMMUNITY)
Admission: RE | Admit: 2019-05-07 | Discharge: 2019-05-07 | Disposition: A | Discharge status: Home / Self Care | Payer: BC Managed Care – PPO | Source: Ambulatory Visit | Attending: Family Medicine | Admitting: Family Medicine

## 2019-05-07 ENCOUNTER — Encounter (HOSPITAL_COMMUNITY): Payer: Self-pay

## 2019-05-07 DIAGNOSIS — E041 Nontoxic single thyroid nodule: Secondary | ICD-10-CM | POA: Insufficient documentation

## 2019-05-07 MED ORDER — LIDOCAINE HCL (PF) 2 % IJ SOLN
INTRAMUSCULAR | Status: AC
  Administered 2019-05-07: 09:00:00 3 mL
  Filled 2019-05-07: qty 10

## 2019-06-18 DIAGNOSIS — H25811 Combined forms of age-related cataract, right eye: Secondary | ICD-10-CM | POA: Diagnosis not present

## 2019-07-17 DIAGNOSIS — Z01818 Encounter for other preprocedural examination: Secondary | ICD-10-CM | POA: Diagnosis not present

## 2019-07-17 DIAGNOSIS — H2511 Age-related nuclear cataract, right eye: Secondary | ICD-10-CM | POA: Diagnosis not present

## 2019-07-17 DIAGNOSIS — H25811 Combined forms of age-related cataract, right eye: Secondary | ICD-10-CM | POA: Diagnosis not present

## 2019-09-23 DIAGNOSIS — E042 Nontoxic multinodular goiter: Secondary | ICD-10-CM | POA: Diagnosis not present

## 2019-10-08 DIAGNOSIS — I10 Essential (primary) hypertension: Secondary | ICD-10-CM | POA: Diagnosis not present

## 2019-10-08 DIAGNOSIS — Z23 Encounter for immunization: Secondary | ICD-10-CM | POA: Diagnosis not present

## 2019-10-08 DIAGNOSIS — E782 Mixed hyperlipidemia: Secondary | ICD-10-CM | POA: Diagnosis not present

## 2019-10-08 DIAGNOSIS — E1169 Type 2 diabetes mellitus with other specified complication: Secondary | ICD-10-CM | POA: Diagnosis not present

## 2019-11-05 ENCOUNTER — Other Ambulatory Visit: Payer: Self-pay

## 2019-11-05 DIAGNOSIS — Z20822 Contact with and (suspected) exposure to covid-19: Secondary | ICD-10-CM

## 2019-11-06 LAB — NOVEL CORONAVIRUS, NAA: SARS-CoV-2, NAA: NOT DETECTED

## 2019-11-19 ENCOUNTER — Ambulatory Visit: Payer: BC Managed Care – PPO | Attending: Internal Medicine

## 2019-11-19 ENCOUNTER — Other Ambulatory Visit: Payer: Self-pay

## 2019-11-19 DIAGNOSIS — Z20828 Contact with and (suspected) exposure to other viral communicable diseases: Secondary | ICD-10-CM | POA: Diagnosis not present

## 2019-11-19 DIAGNOSIS — Z20822 Contact with and (suspected) exposure to covid-19: Secondary | ICD-10-CM

## 2019-11-20 ENCOUNTER — Emergency Department (HOSPITAL_COMMUNITY)
Admission: EM | Admit: 2019-11-20 | Discharge: 2019-11-20 | Disposition: A | Discharge status: Home / Self Care | Payer: BC Managed Care – PPO | Attending: Emergency Medicine | Admitting: Emergency Medicine

## 2019-11-20 ENCOUNTER — Emergency Department (HOSPITAL_COMMUNITY): Payer: BC Managed Care – PPO

## 2019-11-20 ENCOUNTER — Encounter (HOSPITAL_COMMUNITY): Payer: Self-pay | Admitting: Emergency Medicine

## 2019-11-20 ENCOUNTER — Other Ambulatory Visit: Payer: Self-pay

## 2019-11-20 DIAGNOSIS — Z79899 Other long term (current) drug therapy: Secondary | ICD-10-CM | POA: Diagnosis not present

## 2019-11-20 DIAGNOSIS — R630 Anorexia: Secondary | ICD-10-CM | POA: Diagnosis not present

## 2019-11-20 DIAGNOSIS — B349 Viral infection, unspecified: Secondary | ICD-10-CM | POA: Diagnosis not present

## 2019-11-20 DIAGNOSIS — R05 Cough: Secondary | ICD-10-CM | POA: Insufficient documentation

## 2019-11-20 DIAGNOSIS — R6883 Chills (without fever): Secondary | ICD-10-CM | POA: Insufficient documentation

## 2019-11-20 DIAGNOSIS — I10 Essential (primary) hypertension: Secondary | ICD-10-CM | POA: Diagnosis not present

## 2019-11-20 DIAGNOSIS — Z7982 Long term (current) use of aspirin: Secondary | ICD-10-CM | POA: Insufficient documentation

## 2019-11-20 DIAGNOSIS — E119 Type 2 diabetes mellitus without complications: Secondary | ICD-10-CM | POA: Insufficient documentation

## 2019-11-20 DIAGNOSIS — R059 Cough, unspecified: Secondary | ICD-10-CM

## 2019-11-20 DIAGNOSIS — Z7984 Long term (current) use of oral hypoglycemic drugs: Secondary | ICD-10-CM | POA: Diagnosis not present

## 2019-11-20 DIAGNOSIS — Z20828 Contact with and (suspected) exposure to other viral communicable diseases: Secondary | ICD-10-CM | POA: Diagnosis not present

## 2019-11-20 DIAGNOSIS — R1110 Vomiting, unspecified: Secondary | ICD-10-CM | POA: Diagnosis not present

## 2019-11-20 DIAGNOSIS — R509 Fever, unspecified: Secondary | ICD-10-CM | POA: Diagnosis not present

## 2019-11-20 HISTORY — DX: Bilateral primary osteoarthritis of knee: M17.0

## 2019-11-20 LAB — CBC WITH DIFFERENTIAL/PLATELET
Abs Immature Granulocytes: 0.04 10*3/uL (ref 0.00–0.07)
Basophils Absolute: 0 10*3/uL (ref 0.0–0.1)
Basophils Relative: 0 %
Eosinophils Absolute: 0 10*3/uL (ref 0.0–0.5)
Eosinophils Relative: 0 %
HCT: 53.2 % — ABNORMAL HIGH (ref 39.0–52.0)
Hemoglobin: 17.5 g/dL — ABNORMAL HIGH (ref 13.0–17.0)
Immature Granulocytes: 1 %
Lymphocytes Relative: 12 %
Lymphs Abs: 0.9 10*3/uL (ref 0.7–4.0)
MCH: 29 pg (ref 26.0–34.0)
MCHC: 32.9 g/dL (ref 30.0–36.0)
MCV: 88.1 fL (ref 80.0–100.0)
Monocytes Absolute: 0.7 10*3/uL (ref 0.1–1.0)
Monocytes Relative: 9 %
Neutro Abs: 6.1 10*3/uL (ref 1.7–7.7)
Neutrophils Relative %: 78 %
Platelets: 183 10*3/uL (ref 150–400)
RBC: 6.04 MIL/uL — ABNORMAL HIGH (ref 4.22–5.81)
RDW: 12.7 % (ref 11.5–15.5)
WBC: 7.8 10*3/uL (ref 4.0–10.5)
nRBC: 0 % (ref 0.0–0.2)

## 2019-11-20 LAB — COMPREHENSIVE METABOLIC PANEL
ALT: 31 U/L (ref 0–44)
AST: 32 U/L (ref 15–41)
Albumin: 4 g/dL (ref 3.5–5.0)
Alkaline Phosphatase: 85 U/L (ref 38–126)
Anion gap: 13 (ref 5–15)
BUN: 16 mg/dL (ref 8–23)
CO2: 24 mmol/L (ref 22–32)
Calcium: 9.4 mg/dL (ref 8.9–10.3)
Chloride: 96 mmol/L — ABNORMAL LOW (ref 98–111)
Creatinine, Ser: 1.24 mg/dL (ref 0.61–1.24)
GFR calc Af Amer: >60 mL/min (ref >60)
GFR calc non Af Amer: >60 mL/min (ref >60)
Glucose, Bld: 157 mg/dL — ABNORMAL HIGH (ref 70–99)
Potassium: 3.4 mmol/L — ABNORMAL LOW (ref 3.5–5.1)
Sodium: 133 mmol/L — ABNORMAL LOW (ref 135–145)
Total Bilirubin: 1.6 mg/dL — ABNORMAL HIGH (ref 0.3–1.2)
Total Protein: 9.1 g/dL — ABNORMAL HIGH (ref 6.5–8.1)

## 2019-11-20 LAB — URINALYSIS, ROUTINE W REFLEX MICROSCOPIC
Bacteria, UA: NONE SEEN
Bilirubin Urine: NEGATIVE
Glucose, UA: NEGATIVE mg/dL
Ketones, ur: NEGATIVE mg/dL
Leukocytes,Ua: NEGATIVE
Nitrite: NEGATIVE
Protein, ur: >=300 mg/dL — AB
Specific Gravity, Urine: 1.022 (ref 1.005–1.030)
pH: 5 (ref 5.0–8.0)

## 2019-11-20 LAB — LACTIC ACID, PLASMA: Lactic Acid, Venous: 1.8 mmol/L (ref 0.5–1.9)

## 2019-11-20 LAB — POC SARS CORONAVIRUS 2 AG -  ED: SARS Coronavirus 2 Ag: NEGATIVE

## 2019-11-20 LAB — NOVEL CORONAVIRUS, NAA: SARS-CoV-2, NAA: NOT DETECTED

## 2019-11-20 MED ORDER — ONDANSETRON 4 MG PO TBDP: ORAL_TABLET | ORAL | 0 refills | Status: DC

## 2019-11-20 MED ORDER — SODIUM CHLORIDE 0.9 % IV BOLUS
1000.0000 mL | Freq: Once | INTRAVENOUS | Status: AC
  Administered 2019-11-20: 17:00:00 1000 mL via INTRAVENOUS

## 2019-11-20 MED ORDER — SODIUM CHLORIDE 0.9% FLUSH: 3.0000 mL | Freq: Once | INTRAVENOUS | Status: DC

## 2019-11-20 MED ORDER — ONDANSETRON HCL 4 MG/2ML IJ SOLN
4.0000 mg | Freq: Once | INTRAMUSCULAR | Status: AC
  Administered 2019-11-20: 17:00:00 4 mg via INTRAVENOUS
  Filled 2019-11-20: qty 2

## 2019-11-20 MED ORDER — SODIUM CHLORIDE 0.9 % IV BOLUS
1000.0000 mL | Freq: Once | INTRAVENOUS | Status: AC
  Administered 2019-11-20: 1000 mL via INTRAVENOUS

## 2019-11-20 NOTE — ED Triage Notes (Signed)
Patient reports decreased appetite, fever, chills, cough, and weakness x 2 days. Exposed to Covid, tested yesterday but no result back yet.

## 2019-11-20 NOTE — ED Provider Notes (Signed)
[facility] EMERGENCY DEPARTMENT Provider Note   CSN: [id number] Arrival date & time: 11/20/19  1625     History Chief Complaint  Patient presents with  . Emesis    Christopher Sullivan is a 63 y.o. male.  Patient complains of decreased appetite tight vomiting.  Chills and cough  The history is provided by the patient. No language interpreter was used.  Emesis Severity:  Moderate Timing:  Constant Quality:  Bilious material Able to tolerate:  Liquids Progression:  Worsening Chronicity:  New Recent urination:  Normal Context: not post-tussive   Relieved by:  Nothing Associated symptoms: cough   Associated symptoms: no abdominal pain, no diarrhea and no headaches        Past Medical History:  Diagnosis Date  . Bilateral primary osteoarthritis of knee   . Diabetes mellitus without complication ([place])   . GERD (gastroesophageal reflux disease)   . Hyperlipidemia   . Hypertension     Patient Active Problem List   Diagnosis Date Noted  . Bilateral primary osteoarthritis of knee 02/12/2019  . Angioedema 12/12/2018  . Dysphagia 03/13/2018  . Encounter for screening colonoscopy 03/13/2018  . Essential hypertension 02/11/2018  . Type 2 diabetes mellitus without complication, without long-term current use of insulin ([place]) 12/11/2017  . Hyperlipidemia associated with type 2 diabetes mellitus ([place]) 12/11/2017    Past Surgical History:  Procedure Laterality Date  . None         Family History  Problem Relation Age of Onset  . Hypertension Mother   . Diabetes Mother   . Arthritis Mother   . Heart disease Mother   . Miscarriages / [place] Mother   . Stroke Mother   . Depression Father   . Mental illness Father   . Lupus Sister   . Diabetes Daughter   . Hypertension Daughter   . Colon cancer Neg Hx   . Colon polyps Neg Hx     Social History   Tobacco Use  . Smoking status: Never Smoker  . Smokeless tobacco: Never Used  Substance Use Topics  . Alcohol use:  No  . Drug use: No    Home Medications Prior to Admission medications   Medication Sig Start Date End Date Taking? Authorizing Provider  aspirin EC 81 MG tablet Take 81 mg by mouth daily.    [provider]  Diclofenac Sodium CR 100 MG 24 hr tablet Take 1 tablet (100 mg total) by mouth daily. Patient not taking: Reported on 01/29/2019 12/27/18   Palumbo, April, MD  glipiZIDE (GLUCOTROL XL) 10 MG 24 hr tablet Take 1 tablet (10 mg total) by mouth daily with breakfast. 08/18/18   [name], MD  hydrochlorothiazide (HYDRODIURIL) 25 MG tablet Take 1 tablet (25 mg total) by mouth daily for 30 days. 12/13/18 01/12/19  Manuella Ghazi, Pratik D, DO  metFORMIN (GLUCOPHAGE) 1000 MG tablet Take 1,000 mg by mouth daily.    [provider]  methocarbamol (ROBAXIN) 500 MG tablet Take 1 tablet (500 mg total) by mouth 2 (two) times daily. Patient not taking: Reported on 01/29/2019 12/27/18   Palumbo, April, MD  metoprolol succinate (TOPROL-XL) 25 MG 24 hr tablet Take 1 tablet (25 mg total) by mouth daily. 08/18/18   [name], MD  ondansetron (ZOFRAN ODT) 4 MG disintegrating tablet 4mg  ODT q4 hours prn nausea/vomit 11/20/19   Milton Ferguson, MD  polyethylene glycol-electrolytes (TRILYTE) 420 g solution Take 4,000 mLs by mouth as directed. Patient not taking: Reported on 12/12/2018 03/13/18  Fields, Sandi L, MD  simvastatin (ZOCOR) 40 MG tablet TAKE ONE (1) TABLET BY MOUTH EVERY DAY Patient taking differently: Take 40 mg by mouth daily.  08/14/18   [name], MD    Allergies    Patient has no known allergies.  Review of Systems   Review of Systems  Constitutional: Negative for appetite change and fatigue.  HENT: Negative for congestion, ear discharge and sinus pressure.   Eyes: Negative for discharge.  Respiratory: Positive for cough.   Cardiovascular: Negative for chest pain.  Gastrointestinal: Positive for vomiting. Negative for abdominal pain and diarrhea.  Genitourinary: Negative for  frequency and hematuria.  Musculoskeletal: Negative for back pain.  Skin: Negative for rash.  Neurological: Negative for seizures and headaches.  Psychiatric/Behavioral: Negative for hallucinations.    Physical Exam Updated Vital Signs BP 115/82   Pulse (!) 104   Temp (!) 101 F (38.3 C) (Oral)   Resp 18   Ht 6' (1.829 m)   Wt 106.1 kg   SpO2 97%   BMI 31.74 kg/m   Physical Exam Vitals and nursing note reviewed.  Constitutional:      Appearance: He is well-developed.  HENT:     Head: Normocephalic.     Nose: Nose normal.  Eyes:     General: No scleral icterus.    Conjunctiva/sclera: Conjunctivae normal.  Neck:     Thyroid: No thyromegaly.  Cardiovascular:     Rate and Rhythm: Normal rate and regular rhythm.     Heart sounds: No murmur. No friction rub. No gallop.   Pulmonary:     Breath sounds: No stridor. No wheezing or rales.  Chest:     Chest wall: No tenderness.  Abdominal:     General: There is no distension.     Tenderness: There is no abdominal tenderness. There is no rebound.  Musculoskeletal:        General: Normal range of motion.     Cervical back: Neck supple.  Lymphadenopathy:     Cervical: No cervical adenopathy.  Skin:    Findings: No erythema or rash.  Neurological:     Mental Status: He is oriented to person, place, and time.     Motor: No abnormal muscle tone.     Coordination: Coordination normal.  Psychiatric:        Behavior: Behavior normal.     ED Results / Procedures / Treatments   Labs (all labs ordered are listed, but only abnormal results are displayed) Labs Reviewed  COMPREHENSIVE METABOLIC PANEL - Abnormal; Notable for the following components:      Result Value   Sodium 133 (*)    Potassium 3.4 (*)    Chloride 96 (*)    Glucose, Bld 157 (*)    Total Protein 9.1 (*)    Total Bilirubin 1.6 (*)    All other components within normal limits  CBC WITH DIFFERENTIAL/PLATELET - Abnormal; Notable for the following components:     RBC 6.04 (*)    Hemoglobin 17.5 (*)    HCT 53.2 (*)    All other components within normal limits  URINALYSIS, ROUTINE W REFLEX MICROSCOPIC - Abnormal; Notable for the following components:   Color, Urine AMBER (*)    Hgb urine dipstick MODERATE (*)    Protein, ur >=300 (*)    All other components within normal limits  LACTIC ACID, PLASMA  POC SARS CORONAVIRUS 2 AG -  ED    EKG None  Radiology [facility]  1 View  Result Date: 11/20/2019 CLINICAL DATA:  Decreased appetite with fever, chills, cough and weakness for 2 days. History COVID-19 exposure, tested yesterday with unreported results. EXAM: PORTABLE CHEST 1 VIEW COMPARISON:  Radiographs 12/27/2018. FINDINGS: 1730 hours. Significant lordotic positioning, limiting comparison with previous study. Allowing for this, the heart size and mediastinal contours are stable. Probable mild atelectasis or scarring at both lung bases. No confluent or ground-glass opacities, edema, pleural effusion or pneumothorax. No acute osseous findings. IMPRESSION: No active cardiopulmonary process. Probable mild bibasilar atelectasis or scarring. Electronically Signed   By: Richardean Sale M.D.   On: 11/20/2019 17:44    Procedures Procedures (including critical care time)  Medications Ordered in ED Medications  sodium chloride flush (NS) 0.9 % injection 3 mL (has no administration in time range)  sodium chloride 0.9 % bolus 1,000 mL (0 mLs Intravenous Stopped 11/20/19 1902)  ondansetron (ZOFRAN) injection 4 mg (4 mg Intravenous Given 11/20/19 1728)  sodium chloride 0.9 % bolus 1,000 mL (0 mLs Intravenous Stopped 11/20/19 2019)    ED Course  I have reviewed the triage vital signs and the nursing notes.  Pertinent labs & imaging results that were available during my care of the patient were reviewed by me and considered in my medical decision making (see chart for details).    MDM Rules/Calculators/A&P                      Labs unremarkable.   Patient improved with fluids and nausea medicine.  He will be discharged home with Covid instructions.  His rapid Covid test was negative and he has another Covid test pending from couple days ago. Final Clinical Impression(s) / ED Diagnoses Final diagnoses:  Viral syndrome    Rx / DC Orders ED Discharge Orders         Ordered    ondansetron (ZOFRAN ODT) 4 MG disintegrating tablet     11/20/19 2034           Milton Ferguson, MD 11/20/19 2040

## 2019-11-20 NOTE — Discharge Instructions (Signed)
Drink plenty of fluids.  Follow-up with your doctor next week for recheck

## 2019-11-23 ENCOUNTER — Telehealth: Payer: Self-pay | Admitting: *Deleted

## 2019-11-23 ENCOUNTER — Ambulatory Visit: Payer: BC Managed Care – PPO | Attending: Internal Medicine

## 2019-11-23 ENCOUNTER — Other Ambulatory Visit: Payer: Self-pay

## 2019-11-23 DIAGNOSIS — Z20822 Contact with and (suspected) exposure to covid-19: Secondary | ICD-10-CM

## 2019-11-23 DIAGNOSIS — Z20828 Contact with and (suspected) exposure to other viral communicable diseases: Secondary | ICD-10-CM | POA: Diagnosis not present

## 2019-11-23 NOTE — Telephone Encounter (Signed)
I was on the phone with Mr. Christopher Sullivan's wife and he got on the phone and asked for his result. He states he is very ill and has called his doctor and is going back to retest today at 245. His result for 12/24 and 12/25 were both negative but his wife is also very ill and they are not isolating from each other.

## 2019-11-24 LAB — NOVEL CORONAVIRUS, NAA: SARS-CoV-2, NAA: NOT DETECTED

## 2019-11-25 ENCOUNTER — Inpatient Hospital Stay (HOSPITAL_COMMUNITY)
Admission: EM | Admit: 2019-11-25 | Discharge: 2019-11-29 | DRG: 177 | Disposition: A | Discharge status: Home / Self Care | Payer: BC Managed Care – PPO | Attending: Internal Medicine | Admitting: Internal Medicine

## 2019-11-25 ENCOUNTER — Emergency Department (HOSPITAL_COMMUNITY): Payer: BC Managed Care – PPO

## 2019-11-25 ENCOUNTER — Other Ambulatory Visit: Payer: Self-pay

## 2019-11-25 ENCOUNTER — Encounter (HOSPITAL_COMMUNITY): Payer: Self-pay | Admitting: Emergency Medicine

## 2019-11-25 DIAGNOSIS — I480 Paroxysmal atrial fibrillation: Secondary | ICD-10-CM | POA: Diagnosis present

## 2019-11-25 DIAGNOSIS — R627 Adult failure to thrive: Secondary | ICD-10-CM | POA: Diagnosis not present

## 2019-11-25 DIAGNOSIS — IMO0002 Reserved for concepts with insufficient information to code with codable children: Secondary | ICD-10-CM | POA: Diagnosis present

## 2019-11-25 DIAGNOSIS — R000 Tachycardia, unspecified: Secondary | ICD-10-CM | POA: Diagnosis not present

## 2019-11-25 DIAGNOSIS — E785 Hyperlipidemia, unspecified: Secondary | ICD-10-CM | POA: Diagnosis present

## 2019-11-25 DIAGNOSIS — J1282 Pneumonia due to coronavirus disease 2019: Secondary | ICD-10-CM | POA: Diagnosis present

## 2019-11-25 DIAGNOSIS — R32 Unspecified urinary incontinence: Secondary | ICD-10-CM | POA: Diagnosis present

## 2019-11-25 DIAGNOSIS — R7989 Other specified abnormal findings of blood chemistry: Secondary | ICD-10-CM | POA: Diagnosis not present

## 2019-11-25 DIAGNOSIS — Z992 Dependence on renal dialysis: Secondary | ICD-10-CM | POA: Diagnosis not present

## 2019-11-25 DIAGNOSIS — Z8261 Family history of arthritis: Secondary | ICD-10-CM | POA: Diagnosis not present

## 2019-11-25 DIAGNOSIS — A09 Infectious gastroenteritis and colitis, unspecified: Secondary | ICD-10-CM

## 2019-11-25 DIAGNOSIS — Z833 Family history of diabetes mellitus: Secondary | ICD-10-CM

## 2019-11-25 DIAGNOSIS — N186 End stage renal disease: Secondary | ICD-10-CM | POA: Diagnosis not present

## 2019-11-25 DIAGNOSIS — M79661 Pain in right lower leg: Secondary | ICD-10-CM | POA: Diagnosis not present

## 2019-11-25 DIAGNOSIS — Z818 Family history of other mental and behavioral disorders: Secondary | ICD-10-CM

## 2019-11-25 DIAGNOSIS — R531 Weakness: Secondary | ICD-10-CM

## 2019-11-25 DIAGNOSIS — M170 Bilateral primary osteoarthritis of knee: Secondary | ICD-10-CM | POA: Diagnosis not present

## 2019-11-25 DIAGNOSIS — E46 Unspecified protein-calorie malnutrition: Secondary | ICD-10-CM | POA: Diagnosis not present

## 2019-11-25 DIAGNOSIS — E1165 Type 2 diabetes mellitus with hyperglycemia: Secondary | ICD-10-CM | POA: Diagnosis present

## 2019-11-25 DIAGNOSIS — Z823 Family history of stroke: Secondary | ICD-10-CM | POA: Diagnosis not present

## 2019-11-25 DIAGNOSIS — U071 COVID-19: Secondary | ICD-10-CM | POA: Diagnosis not present

## 2019-11-25 DIAGNOSIS — E876 Hypokalemia: Secondary | ICD-10-CM | POA: Diagnosis present

## 2019-11-25 DIAGNOSIS — Z832 Family history of diseases of the blood and blood-forming organs and certain disorders involving the immune mechanism: Secondary | ICD-10-CM | POA: Diagnosis not present

## 2019-11-25 DIAGNOSIS — E1169 Type 2 diabetes mellitus with other specified complication: Secondary | ICD-10-CM | POA: Diagnosis not present

## 2019-11-25 DIAGNOSIS — J1289 Other viral pneumonia: Secondary | ICD-10-CM | POA: Diagnosis not present

## 2019-11-25 DIAGNOSIS — Z7982 Long term (current) use of aspirin: Secondary | ICD-10-CM | POA: Diagnosis not present

## 2019-11-25 DIAGNOSIS — Z8249 Family history of ischemic heart disease and other diseases of the circulatory system: Secondary | ICD-10-CM

## 2019-11-25 DIAGNOSIS — I10 Essential (primary) hypertension: Secondary | ICD-10-CM | POA: Diagnosis present

## 2019-11-25 DIAGNOSIS — Z7984 Long term (current) use of oral hypoglycemic drugs: Secondary | ICD-10-CM

## 2019-11-25 DIAGNOSIS — R112 Nausea with vomiting, unspecified: Secondary | ICD-10-CM

## 2019-11-25 DIAGNOSIS — G8929 Other chronic pain: Secondary | ICD-10-CM | POA: Diagnosis present

## 2019-11-25 DIAGNOSIS — Z6829 Body mass index (BMI) 29.0-29.9, adult: Secondary | ICD-10-CM

## 2019-11-25 DIAGNOSIS — K219 Gastro-esophageal reflux disease without esophagitis: Secondary | ICD-10-CM | POA: Diagnosis present

## 2019-11-25 DIAGNOSIS — R05 Cough: Secondary | ICD-10-CM | POA: Diagnosis not present

## 2019-11-25 DIAGNOSIS — Z888 Allergy status to other drugs, medicaments and biological substances status: Secondary | ICD-10-CM

## 2019-11-25 DIAGNOSIS — E871 Hypo-osmolality and hyponatremia: Secondary | ICD-10-CM | POA: Diagnosis present

## 2019-11-25 DIAGNOSIS — E8809 Other disorders of plasma-protein metabolism, not elsewhere classified: Secondary | ICD-10-CM | POA: Diagnosis not present

## 2019-11-25 DIAGNOSIS — E1122 Type 2 diabetes mellitus with diabetic chronic kidney disease: Secondary | ICD-10-CM | POA: Diagnosis not present

## 2019-11-25 DIAGNOSIS — R17 Unspecified jaundice: Secondary | ICD-10-CM | POA: Diagnosis present

## 2019-11-25 DIAGNOSIS — M79662 Pain in left lower leg: Secondary | ICD-10-CM | POA: Diagnosis not present

## 2019-11-25 DIAGNOSIS — Z79899 Other long term (current) drug therapy: Secondary | ICD-10-CM

## 2019-11-25 LAB — LIPID PANEL
Cholesterol: 83 mg/dL (ref 0–200)
HDL: 29 mg/dL — ABNORMAL LOW (ref >40)
LDL Cholesterol: 36 mg/dL (ref 0–99)
Total CHOL/HDL Ratio: 2.9 RATIO
Triglycerides: 89 mg/dL (ref <150)
VLDL: 18 mg/dL (ref 0–40)

## 2019-11-25 LAB — CBC WITH DIFFERENTIAL/PLATELET
Abs Immature Granulocytes: 0.05 10*3/uL (ref 0.00–0.07)
Basophils Absolute: 0 10*3/uL (ref 0.0–0.1)
Basophils Relative: 0 %
Eosinophils Absolute: 0 10*3/uL (ref 0.0–0.5)
Eosinophils Relative: 1 %
HCT: 46 % (ref 39.0–52.0)
Hemoglobin: 15.1 g/dL (ref 13.0–17.0)
Immature Granulocytes: 1 %
Lymphocytes Relative: 9 %
Lymphs Abs: 0.7 10*3/uL (ref 0.7–4.0)
MCH: 28.9 pg (ref 26.0–34.0)
MCHC: 32.8 g/dL (ref 30.0–36.0)
MCV: 88 fL (ref 80.0–100.0)
Monocytes Absolute: 0.6 10*3/uL (ref 0.1–1.0)
Monocytes Relative: 8 %
Neutro Abs: 6.6 10*3/uL (ref 1.7–7.7)
Neutrophils Relative %: 81 %
Platelets: 237 10*3/uL (ref 150–400)
RBC: 5.23 MIL/uL (ref 4.22–5.81)
RDW: 12.5 % (ref 11.5–15.5)
WBC: 8.1 10*3/uL (ref 4.0–10.5)
nRBC: 0 % (ref 0.0–0.2)

## 2019-11-25 LAB — HEMOGLOBIN A1C
Hgb A1c MFr Bld: 9.2 % — ABNORMAL HIGH (ref 4.8–5.6)
Mean Plasma Glucose: 217.34 mg/dL

## 2019-11-25 LAB — COMPREHENSIVE METABOLIC PANEL
ALT: 23 U/L (ref 0–44)
AST: 22 U/L (ref 15–41)
Albumin: 2.9 g/dL — ABNORMAL LOW (ref 3.5–5.0)
Alkaline Phosphatase: 69 U/L (ref 38–126)
Anion gap: 15 (ref 5–15)
BUN: 14 mg/dL (ref 8–23)
CO2: 25 mmol/L (ref 22–32)
Calcium: 9.2 mg/dL (ref 8.9–10.3)
Chloride: 95 mmol/L — ABNORMAL LOW (ref 98–111)
Creatinine, Ser: 1.04 mg/dL (ref 0.61–1.24)
GFR calc Af Amer: >60 mL/min (ref >60)
GFR calc non Af Amer: >60 mL/min (ref >60)
Glucose, Bld: 162 mg/dL — ABNORMAL HIGH (ref 70–99)
Potassium: 3.4 mmol/L — ABNORMAL LOW (ref 3.5–5.1)
Sodium: 135 mmol/L (ref 135–145)
Total Bilirubin: 1.3 mg/dL — ABNORMAL HIGH (ref 0.3–1.2)
Total Protein: 8.3 g/dL — ABNORMAL HIGH (ref 6.5–8.1)

## 2019-11-25 LAB — TRIGLYCERIDES: Triglycerides: 88 mg/dL (ref <150)

## 2019-11-25 LAB — FIBRINOGEN: Fibrinogen: >800 mg/dL — ABNORMAL HIGH (ref 210–475)

## 2019-11-25 LAB — LACTIC ACID, PLASMA: Lactic Acid, Venous: 1.6 mmol/L (ref 0.5–1.9)

## 2019-11-25 LAB — ABO/RH: ABO/RH(D): O POS

## 2019-11-25 LAB — D-DIMER, QUANTITATIVE: D-Dimer, Quant: 1.67 ug/mL-FEU — ABNORMAL HIGH (ref 0.00–0.50)

## 2019-11-25 LAB — CBG MONITORING, ED
Glucose-Capillary: 169 mg/dL — ABNORMAL HIGH (ref 70–99)
Glucose-Capillary: 294 mg/dL — ABNORMAL HIGH (ref 70–99)
Glucose-Capillary: 313 mg/dL — ABNORMAL HIGH (ref 70–99)

## 2019-11-25 LAB — SARS CORONAVIRUS 2 (TAT 6-24 HRS): SARS Coronavirus 2: POSITIVE — AB

## 2019-11-25 LAB — LACTATE DEHYDROGENASE: LDH: 257 U/L — ABNORMAL HIGH (ref 98–192)

## 2019-11-25 LAB — PROCALCITONIN: Procalcitonin: <0.1 ng/mL

## 2019-11-25 LAB — C-REACTIVE PROTEIN: CRP: 21.2 mg/dL — ABNORMAL HIGH (ref <1.0)

## 2019-11-25 LAB — FERRITIN: Ferritin: 610 ng/mL — ABNORMAL HIGH (ref 24–336)

## 2019-11-25 MED ORDER — IPRATROPIUM-ALBUTEROL 20-100 MCG/ACT IN AERS
1.0000 | INHALATION_SPRAY | Freq: Four times a day (QID) | RESPIRATORY_TRACT | Status: DC | PRN
  Filled 2019-11-25: qty 4

## 2019-11-25 MED ORDER — SODIUM CHLORIDE 0.9 % IV SOLN
1000.0000 mL | INTRAVENOUS | Status: DC
  Administered 2019-11-25: 1000 mL via INTRAVENOUS

## 2019-11-25 MED ORDER — METOPROLOL SUCCINATE ER 25 MG PO TB24
25.0000 mg | ORAL_TABLET | Freq: Every day | ORAL | Status: DC
  Administered 2019-11-25 – 2019-11-29 (×5): 25 mg via ORAL
  Filled 2019-11-25 (×5): qty 1

## 2019-11-25 MED ORDER — SODIUM CHLORIDE 0.9 % IV SOLN
100.0000 mg | Freq: Every day | INTRAVENOUS | Status: AC
  Administered 2019-11-26 – 2019-11-29 (×4): 100 mg via INTRAVENOUS
  Filled 2019-11-25: qty 20
  Filled 2019-11-25 (×4): qty 100

## 2019-11-25 MED ORDER — DEXAMETHASONE SODIUM PHOSPHATE 10 MG/ML IJ SOLN
6.0000 mg | Freq: Once | INTRAMUSCULAR | Status: AC
  Administered 2019-11-25: 6 mg via INTRAVENOUS
  Filled 2019-11-25: qty 1

## 2019-11-25 MED ORDER — GUAIFENESIN-DM 100-10 MG/5ML PO SYRP
10.0000 mL | ORAL_SOLUTION | ORAL | Status: DC | PRN
  Administered 2019-11-26: 10 mL via ORAL
  Filled 2019-11-25: qty 10

## 2019-11-25 MED ORDER — ASCORBIC ACID 500 MG PO TABS
500.0000 mg | ORAL_TABLET | Freq: Every day | ORAL | Status: DC
  Administered 2019-11-25 – 2019-11-29 (×5): 500 mg via ORAL
  Filled 2019-11-25 (×5): qty 1

## 2019-11-25 MED ORDER — ACETAMINOPHEN 500 MG PO TABS
1000.0000 mg | ORAL_TABLET | Freq: Once | ORAL | Status: AC
  Administered 2019-11-25: 1000 mg via ORAL
  Filled 2019-11-25: qty 2

## 2019-11-25 MED ORDER — DEXAMETHASONE SODIUM PHOSPHATE 10 MG/ML IJ SOLN
6.0000 mg | INTRAMUSCULAR | Status: DC
  Administered 2019-11-25 – 2019-11-28 (×4): 6 mg via INTRAVENOUS
  Filled 2019-11-25 (×4): qty 1

## 2019-11-25 MED ORDER — INSULIN DETEMIR 100 UNIT/ML ~~LOC~~ SOLN
0.1000 [IU]/kg | Freq: Two times a day (BID) | SUBCUTANEOUS | Status: DC
  Administered 2019-11-25 – 2019-11-27 (×5): 11 [IU] via SUBCUTANEOUS
  Filled 2019-11-25 (×6): qty 0.11

## 2019-11-25 MED ORDER — SODIUM CHLORIDE 0.9 % IV SOLN
1000.0000 mL | INTRAVENOUS | Status: DC
  Administered 2019-11-25 – 2019-11-28 (×6): 1000 mL via INTRAVENOUS

## 2019-11-25 MED ORDER — ZINC SULFATE 220 (50 ZN) MG PO CAPS
220.0000 mg | ORAL_CAPSULE | Freq: Every day | ORAL | Status: DC
  Administered 2019-11-25 – 2019-11-29 (×5): 220 mg via ORAL
  Filled 2019-11-25 (×5): qty 1

## 2019-11-25 MED ORDER — ONDANSETRON HCL 4 MG PO TABS: 4.0000 mg | ORAL_TABLET | Freq: Four times a day (QID) | ORAL | Status: DC | PRN

## 2019-11-25 MED ORDER — POTASSIUM CHLORIDE CRYS ER 20 MEQ PO TBCR
20.0000 meq | EXTENDED_RELEASE_TABLET | Freq: Once | ORAL | Status: AC
  Administered 2019-11-25: 20 meq via ORAL
  Filled 2019-11-25: qty 1

## 2019-11-25 MED ORDER — ENOXAPARIN SODIUM 40 MG/0.4ML ~~LOC~~ SOLN
40.0000 mg | SUBCUTANEOUS | Status: DC
  Administered 2019-11-25 – 2019-11-27 (×3): 40 mg via SUBCUTANEOUS
  Filled 2019-11-25 (×4): qty 0.4

## 2019-11-25 MED ORDER — ACETAMINOPHEN 325 MG PO TABS: 650.0000 mg | ORAL_TABLET | Freq: Four times a day (QID) | ORAL | Status: DC | PRN

## 2019-11-25 MED ORDER — ASPIRIN EC 81 MG PO TBEC
81.0000 mg | DELAYED_RELEASE_TABLET | Freq: Every day | ORAL | Status: DC
  Administered 2019-11-25 – 2019-11-28 (×4): 81 mg via ORAL
  Filled 2019-11-25 (×4): qty 1

## 2019-11-25 MED ORDER — SODIUM CHLORIDE 0.9 % IV SOLN
200.0000 mg | Freq: Once | INTRAVENOUS | Status: AC
  Administered 2019-11-25: 200 mg via INTRAVENOUS
  Filled 2019-11-25: qty 40

## 2019-11-25 MED ORDER — SIMVASTATIN 40 MG PO TABS
40.0000 mg | ORAL_TABLET | Freq: Every day | ORAL | Status: DC
  Administered 2019-11-25 – 2019-11-27 (×3): 40 mg via ORAL
  Filled 2019-11-25 (×3): qty 1

## 2019-11-25 MED ORDER — ONDANSETRON HCL 4 MG/2ML IJ SOLN: 4.0000 mg | Freq: Four times a day (QID) | INTRAMUSCULAR | Status: DC | PRN

## 2019-11-25 MED ORDER — INSULIN ASPART 100 UNIT/ML ~~LOC~~ SOLN
0.0000 [IU] | SUBCUTANEOUS | Status: DC
  Administered 2019-11-25: 5 [IU] via SUBCUTANEOUS
  Administered 2019-11-25: 2 [IU] via SUBCUTANEOUS
  Administered 2019-11-25: 7 [IU] via SUBCUTANEOUS
  Administered 2019-11-26: 3 [IU] via SUBCUTANEOUS
  Administered 2019-11-26 (×2): 2 [IU] via SUBCUTANEOUS
  Administered 2019-11-26 (×2): 3 [IU] via SUBCUTANEOUS
  Administered 2019-11-27: 2 [IU] via SUBCUTANEOUS
  Administered 2019-11-27: 3 [IU] via SUBCUTANEOUS
  Administered 2019-11-27: 2 [IU] via SUBCUTANEOUS
  Filled 2019-11-25: qty 0.09

## 2019-11-25 NOTE — H&P (Signed)
History and Physical        Hospital Admission Note Date: 11/25/2019  Patient name: Christopher Sullivan Medical record number: [id number] Date of birth: [birth year] Age: 63 y.o. Gender: male  PCP: Patient, No Pcp Per  Patient coming from: Home Lives with: Wife At baseline, ambulates: Independently  Chief Complaint    Diarrhea Generalized body aches   HPI:   This is a 63 year old male with a positive COVID-19 contact (wife) who presented on 12/30 with 1 week diarrhea and generalized body aches and decreased appetite/decreased p.o. intake.  Also with cough.  His symptoms worsened over the past 2 days.  He was recently seen at [facility] on 12/25 for decreased appetite and vomiting, chills and cough with negative rapid Covid test at that time.  Improved with IV fluids and antinausea medicine and discharged home with Covid instructions.  He had repeat Covid testing on 12/28 which also was negative.  Currently, patient states that he has "only been sick 2 times in his life and this is the worst it ever felt." Also admits to bilateral lower extremity calf pain which is new.  Had an episode of urinary incontinence this a.m. has chronic low back pain which has not worsened.  No other neurologic deficit such as saddle anesthesia or lower extremity weakness.  Review of Systems: See HPI Review of Systems  Constitutional: Positive for diaphoresis and malaise/fatigue.  HENT: Negative for sore throat.   Eyes: Negative for blurred vision.  Respiratory: Positive for cough and wheezing. Negative for sputum production and shortness of breath.   Cardiovascular: Negative for chest pain and palpitations.  Gastrointestinal: Positive for diarrhea, nausea and vomiting.  Genitourinary:       Episode of incontinence this a.m.  Musculoskeletal: Positive for back pain and myalgias.       Chronic back pain  Skin:  Negative for rash.  Neurological: Negative for dizziness and headaches.  Psychiatric/Behavioral: The patient does not have insomnia.   All other systems reviewed and are negative.   ED Workup/Course    Notable Labs: K3.4, Cl 95, glucose 162, creatinine 1.04 (baseline 0.9), albumin 2.9, total protein 8.3, total bilirubin 1.3, procalcitonin negative, ferritin 610, CRP 21, D-dimer 1.67, fibrinogen > 800.  Covid pending  Imaging: CXR new hazy airspace opacities in the peripheral aspect of bilateral lungs suspicious for multifocal viral pneumonia  Treatment: Given Tylenol 1000 mg x 1, Decadron 6 mg x 1, NS infusion at 125 cc/h  Medical/Social/Family History   Past Medical History: Past Medical History:  Diagnosis Date  . Bilateral primary osteoarthritis of knee   . Diabetes mellitus without complication ([place])   . GERD (gastroesophageal reflux disease)   . Hyperlipidemia   . Hypertension     Past Surgical History:  Procedure Laterality Date  . None      Medications: Prior to Admission medications   Medication Sig Start Date End Date Taking? Authorizing Provider  aspirin EC 81 MG tablet Take 81 mg by mouth daily.   Yes [provider]  glipiZIDE (GLUCOTROL XL) 10 MG 24 hr tablet Take 1 tablet (10 mg total) by mouth daily with breakfast. 08/18/18  Yes Hagler, Apolonio Schneiders, MD  hydrochlorothiazide (HYDRODIURIL) 25 MG tablet  Take 1 tablet (25 mg total) by mouth daily for 30 days. 12/13/18 11/25/19 Yes Shah, Pratik D, DO  metFORMIN (GLUCOPHAGE) 1000 MG tablet Take 1,000 mg by mouth 2 (two) times daily with a meal.    Yes [provider]  metoprolol succinate (TOPROL-XL) 25 MG 24 hr tablet Take 1 tablet (25 mg total) by mouth daily. 08/18/18  Yes Hagler, Apolonio Schneiders, MD  simvastatin (ZOCOR) 40 MG tablet TAKE ONE (1) TABLET BY MOUTH EVERY DAY Patient taking differently: Take 40 mg by mouth daily.  08/14/18  Yes [name], MD    Allergies:   Allergies  Allergen Reactions  .  Lisinopril Swelling    Social History:  reports that he has never smoked. He has never used smokeless tobacco. He reports that he does not drink alcohol or use drugs.  Family History: Family History  Problem Relation Age of Onset  . Hypertension Mother   . Diabetes Mother   . Arthritis Mother   . Heart disease Mother   . Miscarriages / [place] Mother   . Stroke Mother   . Depression Father   . Mental illness Father   . Lupus Sister   . Diabetes Daughter   . Hypertension Daughter   . Colon cancer Neg Hx   . Colon polyps Neg Hx      Objective   Physical Exam: Blood pressure (!) 96/58, pulse (!) 106, temperature 99.3 F (37.4 C), temperature source Oral, resp. rate 14, SpO2 96 %.  Physical Exam Vitals and nursing note reviewed.  Constitutional:      General: He is not in acute distress.    Appearance: He is ill-appearing.  HENT:     Head: Normocephalic.     Nose:     Comments: Wearing mask Eyes:     Conjunctiva/sclera: Conjunctivae normal.  Cardiovascular:     Rate and Rhythm: Normal rate.     Pulses: Normal pulses.  Pulmonary:     Effort: Pulmonary effort is normal. No respiratory distress.     Comments: Room air Abdominal:     General: Abdomen is flat.     Palpations: Abdomen is soft.  Musculoskeletal:        General: Tenderness present. No swelling.     Comments: Bilateral lower extremity calf tenderness  Skin:    Coloration: Skin is not jaundiced.  Neurological:     Mental Status: He is alert. Mental status is at baseline.  Psychiatric:        Mood and Affect: Mood normal.        Behavior: Behavior normal.     LABS on Admission: I have personally reviewed all the labs and imagings below    Basic Metabolic Panel: Recent Labs  Lab 11/20/19 1650 11/25/19 1322  NA 133* 135  K 3.4* 3.4*  CL 96* 95*  CO2 24 25  GLUCOSE 157* 162*  BUN 16 14  CREATININE 1.24 1.04  CALCIUM 9.4 9.2   Liver Function Tests: Recent Labs  Lab 11/20/19 1650  11/25/19 1322  AST 32 22  ALT 31 23  ALKPHOS 85 69  BILITOT 1.6* 1.3*  PROT 9.1* 8.3*  ALBUMIN 4.0 2.9*   No results for input(s): LIPASE, AMYLASE in the last 168 hours. No results for input(s): AMMONIA in the last 168 hours. CBC: Recent Labs  Lab 11/20/19 1650 11/25/19 1322  WBC 7.8 8.1  NEUTROABS 6.1 6.6  HGB 17.5* 15.1  HCT 53.2* 46.0  MCV 88.1 88.0  PLT 183 237  Cardiac Enzymes: No results for input(s): CKTOTAL, CKMB, CKMBINDEX, TROPONINI in the last 168 hours. BNP: Invalid input(s): POCBNP CBG: No results for input(s): GLUCAP in the last 168 hours.  Radiological Exams on Admission:  DG Chest Port 1 View  Result Date: 11/25/2019 CLINICAL DATA:  COVID-19 positive, cough EXAM: PORTABLE CHEST 1 VIEW COMPARISON:  11/20/2019 FINDINGS: The heart size and mediastinal contours are stable. New hazy airspace opacities within the peripheral aspects of the bilateral lung fields, most pronounced within the mid to lower lungs. No pleural effusion or pneumothorax. IMPRESSION: New hazy airspace opacities within the peripheral aspects of the bilateral lungs suspicious for multifocal viral pneumonia. Electronically Signed   By: Davina Poke D.O.   On: 11/25/2019 12:54      EKG: Independently reviewed.  T wave inversions in lead III, noted on previous EKG 2018   A & P   Active Problems:   * No active hospital problems. *   1. Failure to thrive suspect secondary to COVID 19 2. COVID-19 PUI a. Positive COVID-19 contact (wife), 2 negative Covid tests over the past week b. Satting well on room air c. procalcitonin negative, ferritin 610, CRP 21, D-dimer 1.67, fibrinogen > 800.  Covid pending d. CXR concerning for multifocal viral pneumonia e. Received IV fluids and Decadron in ED f. Will start on Decadron, remdesivir per pharmacy, vitamin C and zinc g. Consider Actemra with CRP 21 pending Covid result h. Continue IV fluids i. Check for flu and RSV 3. Type 2  diabetes a. On glipizide and Metformin at home b. Hold home meds c. HA1C 8.6 in 2019, recheck d. Insulin for Covid order set steroids 4. Bilateral lower extremity calf tenderness with elevated D-dimer a. Bilateral lower extremity venous ultrasound 5. Nausea/vomiting a. Wishes to eat, advance diet to heart healthy/carb modified and discontinue fluids if patient tolerates p.o. intake b. Zofran for nausea 6. Hypertension a. On HCTZ and Toprol-XL b. History of angioedema on ACE inhibitor January 2020 c. Hold HCTZ with hypokalemia 7. Urinary incontinence a. Episode this a.m., unknown etiology, no other red flags b. Consider MRI if worsened c. Monitor output 8. Hypokalemia a. Replace 9. GERD a. Pepcid 10. Hyperlipidemia a. Statin b. Check lipid panel   DVT prophylaxis: Lovenox   Code Status: Prior  Diet: Heart healthy/carb modified Family Communication: Admission, patients condition and plan of care including tests being ordered have been discussed with the patient who indicates understanding and agrees with the plan and Code Status. Patient's wife was updated by phone Disposition Plan: The appropriate patient status for this patient is INPATIENT. Inpatient status is judged to be reasonable and necessary in order to provide the required intensity of service to ensure the patient's safety. The patient's presenting symptoms, physical exam findings, and initial radiographic and laboratory data in the context of their chronic comorbidities is felt to place them at high risk for further clinical deterioration. Furthermore, it is not anticipated that the patient will be medically stable for discharge from the hospital within 2 midnights of admission. The following factors support the patient status of inpatient.   " The patient's presenting symptoms include generalized malaise, bilateral lower extremity calf tenderness, nausea, vomiting and cough with positive Covid contact. " The worrisome  physical exam findings include lateral calf tenderness, cough. " The initial radiographic and laboratory data are worrisome because of new hazy airspace opacities in the peripheral aspect of bilateral lung suspicious for multifocal viral pneumonia on chest x-ray.  Elevated D-dimer, significantly  elevated CRP. " The chronic co-morbidities include hypertension, hyperlipidemia, type 2 diabetes.   * I certify that at the point of admission it is my clinical judgment that the patient will require inpatient hospital care spanning beyond 2 midnights from the point of admission due to high intensity of service, high risk for further deterioration and high frequency of surveillance required.*    The medical decision making on this patient was of high complexity and the patient is at high risk for clinical deterioration, therefore this is a level 3  admission.  Consultants  . None  Procedures  . None  Time Spent on Admission: 75 minutes   Harold Hedge, DO [facility] 11/25/2019, 4:09 PM

## 2019-11-25 NOTE — ED Provider Notes (Signed)
[facility] DEPT Provider Note   CSN: [id number] Arrival date & time: 11/25/19  1125     History Chief Complaint  Patient presents with  . Diarrhea  . Generalized Body Aches    Christopher Sullivan is a 63 y.o. male.  HPI Patient is wife is Covid positive.  She is getting outpatient infusion therapy.  Patient has been sick for about a week.  He has been having persistent diarrhea and intermittent vomiting.  He reports he cannot eat much.  He can only drink a little bit.  He reports his abdomen is diffusely uncomfortable.  He reports he has been coughing and feels short of breath.  He reports he is extremely fatigued.  The symptoms have worsened over the past 2 days.    Past Medical History:  Diagnosis Date  . Bilateral primary osteoarthritis of knee   . Diabetes mellitus without complication ([place])   . GERD (gastroesophageal reflux disease)   . Hyperlipidemia   . Hypertension     Patient Active Problem List   Diagnosis Date Noted  . Bilateral primary osteoarthritis of knee 02/12/2019  . Angioedema 12/12/2018  . Dysphagia 03/13/2018  . Encounter for screening colonoscopy 03/13/2018  . Essential hypertension 02/11/2018  . Type 2 diabetes mellitus without complication, without long-term current use of insulin ([place]) 12/11/2017  . Hyperlipidemia associated with type 2 diabetes mellitus ([place]) 12/11/2017    Past Surgical History:  Procedure Laterality Date  . None         Family History  Problem Relation Age of Onset  . Hypertension Mother   . Diabetes Mother   . Arthritis Mother   . Heart disease Mother   . Miscarriages / [place] Mother   . Stroke Mother   . Depression Father   . Mental illness Father   . Lupus Sister   . Diabetes Daughter   . Hypertension Daughter   . Colon cancer Neg Hx   . Colon polyps Neg Hx     Social History   Tobacco Use  . Smoking status: Never Smoker  . Smokeless tobacco: Never Used  Substance Use Topics   . Alcohol use: No  . Drug use: No    Home Medications Prior to Admission medications   Medication Sig Start Date End Date Taking? Authorizing Provider  aspirin EC 81 MG tablet Take 81 mg by mouth daily.   Yes [provider]  glipiZIDE (GLUCOTROL XL) 10 MG 24 hr tablet Take 1 tablet (10 mg total) by mouth daily with breakfast. 08/18/18  Yes Hagler, Apolonio Schneiders, MD  hydrochlorothiazide (HYDRODIURIL) 25 MG tablet Take 1 tablet (25 mg total) by mouth daily for 30 days. 12/13/18 11/25/19 Yes Shah, Pratik D, DO  metFORMIN (GLUCOPHAGE) 1000 MG tablet Take 1,000 mg by mouth 2 (two) times daily with a meal.    Yes [provider]  metoprolol succinate (TOPROL-XL) 25 MG 24 hr tablet Take 1 tablet (25 mg total) by mouth daily. 08/18/18  Yes Hagler, Apolonio Schneiders, MD  simvastatin (ZOCOR) 40 MG tablet TAKE ONE (1) TABLET BY MOUTH EVERY DAY Patient taking differently: Take 40 mg by mouth daily.  08/14/18  Yes [name], MD    Allergies    Lisinopril  Review of Systems   Review of Systems 10 Systems reviewed and are negative for acute change except as noted in the HPI.  Physical Exam Updated Vital Signs BP (!) 96/58   Pulse (!) 106   Temp 99.3 F (37.4 C) (Oral)  Resp 14   SpO2 96%   Physical Exam Constitutional:      Comments: Patient is alert with clear mental status but fatigued in appearance.  Occasional cough.  No respiratory distress at rest.  HENT:     Head: Normocephalic and atraumatic.  Eyes:     Extraocular Movements: Extraocular movements intact.  Cardiovascular:     Comments: Tachycardia, no gross rub murmur gallop. Pulmonary:     Comments: Tachypnea but no significant respiratory distress at rest.  Crackles at the bases and occasional expiratory wheeze. Abdominal:     General: There is no distension.     Palpations: Abdomen is soft.     Tenderness: There is abdominal tenderness. There is no guarding.     Comments: Moderate diffuse tenderness to palpation.   Musculoskeletal:        General: No swelling or tenderness. Normal range of motion.     Right lower leg: No edema.     Left lower leg: No edema.  Skin:    General: Skin is warm and dry.  Neurological:     General: No focal deficit present.     Mental Status: He is oriented to person, place, and time.     Coordination: Coordination normal.  Psychiatric:        Mood and Affect: Mood normal.     ED Results / Procedures / Treatments   Labs (all labs ordered are listed, but only abnormal results are displayed) Labs Reviewed  COMPREHENSIVE METABOLIC PANEL - Abnormal; Notable for the following components:      Result Value   Potassium 3.4 (*)    Chloride 95 (*)    Glucose, Bld 162 (*)    Total Protein 8.3 (*)    Albumin 2.9 (*)    Total Bilirubin 1.3 (*)    All other components within normal limits  D-DIMER, QUANTITATIVE (NOT AT [facility]) - Abnormal; Notable for the following components:   D-Dimer, Quant 1.67 (*)    All other components within normal limits  LACTATE DEHYDROGENASE - Abnormal; Notable for the following components:   LDH 257 (*)    All other components within normal limits  FERRITIN - Abnormal; Notable for the following components:   Ferritin 610 (*)    All other components within normal limits  FIBRINOGEN - Abnormal; Notable for the following components:   Fibrinogen >800 (*)    All other components within normal limits  C-REACTIVE PROTEIN - Abnormal; Notable for the following components:   CRP 21.2 (*)    All other components within normal limits  SARS CORONAVIRUS 2 (TAT 6-24 HRS)  CULTURE, BLOOD (ROUTINE X 2)  CULTURE, BLOOD (ROUTINE X 2)  GI PATHOGEN PANEL BY PCR, STOOL  C DIFFICILE QUICK SCREEN W PCR REFLEX  LACTIC ACID, PLASMA  CBC WITH DIFFERENTIAL/PLATELET  PROCALCITONIN  TRIGLYCERIDES  LACTIC ACID, PLASMA    EKG EKG Interpretation  Date/Time:  Wednesday November 25 2019 13:21:06 EST Ventricular Rate:  111 PR Interval:    QRS Duration: 98 QT  Interval:  335 QTC Calculation: 456 R Axis:   -2 Text Interpretation: Sinus tachycardia Borderline repolarization abnormality Baseline wander in lead(s) V2 increased rate o/w no sig change from previous Confirmed by Charlesetta Shanks [phone]) on 11/25/2019 2:38:23 PM   Radiology DG Chest Port 1 View  Result Date: 11/25/2019 CLINICAL DATA:  COVID-19 positive, cough EXAM: PORTABLE CHEST 1 VIEW COMPARISON:  11/20/2019 FINDINGS: The heart size and mediastinal contours are stable. New hazy airspace opacities  within the peripheral aspects of the bilateral lung fields, most pronounced within the mid to lower lungs. No pleural effusion or pneumothorax. IMPRESSION: New hazy airspace opacities within the peripheral aspects of the bilateral lungs suspicious for multifocal viral pneumonia. Electronically Signed   By: Davina Poke D.O.   On: 11/25/2019 12:54    Procedures Procedures (including critical care time) CRITICAL CARE Performed by: Charlesetta Shanks   Total critical care time: 30 minutes  Critical care time was exclusive of separately billable procedures and treating other patients.  Critical care was necessary to treat or prevent imminent or life-threatening deterioration.  Critical care was time spent personally by me on the following activities: development of treatment plan with patient and/or surrogate as well as nursing, discussions with consultants, evaluation of patient's response to treatment, examination of patient, obtaining history from patient or surrogate, ordering and performing treatments and interventions, ordering and review of laboratory studies, ordering and review of radiographic studies, pulse oximetry and re-evaluation of patient's condition. Medications Ordered in ED Medications  0.9 %  sodium chloride infusion (1,000 mLs Intravenous New Bag/Given 11/25/19 1359)  acetaminophen (TYLENOL) tablet 1,000 mg (1,000 mg Oral Given 11/25/19 1400)  dexamethasone (DECADRON)  injection 6 mg (6 mg Intravenous Given 11/25/19 1401)    ED Course  I have reviewed the triage vital signs and the nursing notes.  Pertinent labs & imaging results that were available during my care of the patient were reviewed by me and considered in my medical decision making (see chart for details).    MDM Rules/Calculators/A&P                     Patient's wife is Covid positive and getting outpatient infusion therapy.I  Did speak with her on the phone, she has unremitting cough and sounds dyspneic on the phone.  She did provide additional history for her husband and was updated regarding his condition.  She was independently counseled to immediately contact the infusion therapy center to see if they had healthcare providers monitoring her condition for hypoxia and vital signs.  She was advised that should she feel her condition was worsening and could not get guidance from her outpatient provider, she should present to the emergency department for evaluation.  Patient has been progressively getting worse over the course of the week.  He had been seen earlier with diarrheal illness.  He reports that has persisted in addition to vomiting.  He reports he is feeling very weak and cannot eat or drink very well.  He also is having cough and shortness of breath with generalized weakness.  Chest x-ray shows diffuse patchy pneumonia, patient is tachypneic and tachycardic, he is not hypoxic.  I do feel he will need admission for treatment of worsening Covid pneumonia at approximately 7 days of symptoms. Final Clinical Impression(s) / ED Diagnoses Final diagnoses:  Pneumonia due to COVID-19 virus  Diarrhea of infectious origin  Generalized weakness    Rx / DC Orders ED Discharge Orders    None       Charlesetta Shanks, MD 11/25/19 1545

## 2019-11-25 NOTE — ED Triage Notes (Signed)
Patient reports body aches, cough, diarrhea, and fever x1 week. Patient's wife is Covid+.

## 2019-11-26 ENCOUNTER — Inpatient Hospital Stay (HOSPITAL_COMMUNITY): Payer: BC Managed Care – PPO

## 2019-11-26 ENCOUNTER — Other Ambulatory Visit: Payer: Self-pay

## 2019-11-26 DIAGNOSIS — E1169 Type 2 diabetes mellitus with other specified complication: Secondary | ICD-10-CM

## 2019-11-26 DIAGNOSIS — IMO0002 Reserved for concepts with insufficient information to code with codable children: Secondary | ICD-10-CM | POA: Diagnosis present

## 2019-11-26 DIAGNOSIS — I10 Essential (primary) hypertension: Secondary | ICD-10-CM

## 2019-11-26 DIAGNOSIS — E8809 Other disorders of plasma-protein metabolism, not elsewhere classified: Secondary | ICD-10-CM | POA: Diagnosis present

## 2019-11-26 DIAGNOSIS — E1165 Type 2 diabetes mellitus with hyperglycemia: Secondary | ICD-10-CM | POA: Diagnosis present

## 2019-11-26 DIAGNOSIS — R531 Weakness: Secondary | ICD-10-CM

## 2019-11-26 DIAGNOSIS — U071 COVID-19: Secondary | ICD-10-CM | POA: Diagnosis present

## 2019-11-26 DIAGNOSIS — M79661 Pain in right lower leg: Secondary | ICD-10-CM

## 2019-11-26 DIAGNOSIS — I480 Paroxysmal atrial fibrillation: Secondary | ICD-10-CM

## 2019-11-26 DIAGNOSIS — E785 Hyperlipidemia, unspecified: Secondary | ICD-10-CM

## 2019-11-26 DIAGNOSIS — J1289 Other viral pneumonia: Secondary | ICD-10-CM

## 2019-11-26 DIAGNOSIS — E871 Hypo-osmolality and hyponatremia: Secondary | ICD-10-CM

## 2019-11-26 DIAGNOSIS — R7989 Other specified abnormal findings of blood chemistry: Secondary | ICD-10-CM

## 2019-11-26 DIAGNOSIS — E876 Hypokalemia: Secondary | ICD-10-CM | POA: Diagnosis present

## 2019-11-26 DIAGNOSIS — M79662 Pain in left lower leg: Secondary | ICD-10-CM

## 2019-11-26 LAB — CBC WITH DIFFERENTIAL/PLATELET
Abs Immature Granulocytes: 0.05 10*3/uL (ref 0.00–0.07)
Basophils Absolute: 0 10*3/uL (ref 0.0–0.1)
Basophils Relative: 0 %
Eosinophils Absolute: 0 10*3/uL (ref 0.0–0.5)
Eosinophils Relative: 0 %
HCT: 43.1 % (ref 39.0–52.0)
Hemoglobin: 13.8 g/dL (ref 13.0–17.0)
Immature Granulocytes: 1 %
Lymphocytes Relative: 8 %
Lymphs Abs: 0.5 10*3/uL — ABNORMAL LOW (ref 0.7–4.0)
MCH: 28.6 pg (ref 26.0–34.0)
MCHC: 32 g/dL (ref 30.0–36.0)
MCV: 89.2 fL (ref 80.0–100.0)
Monocytes Absolute: 0.3 10*3/uL (ref 0.1–1.0)
Monocytes Relative: 5 %
Neutro Abs: 5.5 10*3/uL (ref 1.7–7.7)
Neutrophils Relative %: 86 %
Platelets: 251 10*3/uL (ref 150–400)
RBC: 4.83 MIL/uL (ref 4.22–5.81)
RDW: 12.5 % (ref 11.5–15.5)
WBC: 6.4 10*3/uL (ref 4.0–10.5)
nRBC: 0 % (ref 0.0–0.2)

## 2019-11-26 LAB — GLUCOSE, CAPILLARY
Glucose-Capillary: 187 mg/dL — ABNORMAL HIGH (ref 70–99)
Glucose-Capillary: 160 mg/dL — ABNORMAL HIGH (ref 70–99)
Glucose-Capillary: 208 mg/dL — ABNORMAL HIGH (ref 70–99)
Glucose-Capillary: 227 mg/dL — ABNORMAL HIGH (ref 70–99)
Glucose-Capillary: 234 mg/dL — ABNORMAL HIGH (ref 70–99)

## 2019-11-26 LAB — TROPONIN I (HIGH SENSITIVITY)
Troponin I (High Sensitivity): 8 ng/L (ref <18)
Troponin I (High Sensitivity): 10 ng/L (ref <18)

## 2019-11-26 LAB — MAGNESIUM: Magnesium: 2.1 mg/dL (ref 1.7–2.4)

## 2019-11-26 LAB — FERRITIN: Ferritin: 635 ng/mL — ABNORMAL HIGH (ref 24–336)

## 2019-11-26 LAB — D-DIMER, QUANTITATIVE: D-Dimer, Quant: 1.63 ug/mL-FEU — ABNORMAL HIGH (ref 0.00–0.50)

## 2019-11-26 LAB — C-REACTIVE PROTEIN: CRP: 20.4 mg/dL — ABNORMAL HIGH (ref <1.0)

## 2019-11-26 MED ORDER — POTASSIUM CHLORIDE CRYS ER 20 MEQ PO TBCR
40.0000 meq | EXTENDED_RELEASE_TABLET | Freq: Once | ORAL | Status: AC
  Administered 2019-11-26: 40 meq via ORAL
  Filled 2019-11-26: qty 2

## 2019-11-26 MED ORDER — ALUM & MAG HYDROXIDE-SIMETH 200-200-20 MG/5ML PO SUSP
15.0000 mL | ORAL | Status: DC | PRN
  Administered 2019-11-26: 15 mL via ORAL
  Filled 2019-11-26: qty 30

## 2019-11-26 MED ORDER — DILTIAZEM HCL-DEXTROSE 125-5 MG/125ML-% IV SOLN (PREMIX)
5.0000 mg/h | INTRAVENOUS | Status: DC
  Administered 2019-11-26: 10 mg/h via INTRAVENOUS
  Administered 2019-11-26 – 2019-11-27 (×3): 15 mg/h via INTRAVENOUS
  Filled 2019-11-26 (×5): qty 125

## 2019-11-26 MED ORDER — DILTIAZEM LOAD VIA INFUSION
10.0000 mg | Freq: Once | INTRAVENOUS | Status: AC
  Administered 2019-11-26: 10 mg via INTRAVENOUS
  Filled 2019-11-26: qty 10

## 2019-11-26 NOTE — Progress Notes (Signed)
Bilateral lower extremity venous duplex has been completed. Preliminary results can be found in CV Proc through chart review.  Results were given to the patient's nurse, Megan.  11/26/19 8:58 AM Carlos Levering RVT

## 2019-11-26 NOTE — Progress Notes (Signed)
Cardizem remains at 15mg /hr.  HR now 100-110 AFib  BP 112/77 - remains asymptomatic.

## 2019-11-26 NOTE — Progress Notes (Signed)
Called ED to get report for pt coming into 1422 and no response yet.

## 2019-11-26 NOTE — ED Notes (Signed)
Attempted to call report and will call back in about 10 minutes.

## 2019-11-26 NOTE — Progress Notes (Signed)
Called again to get report. Put on hold and still no response. Waiting for report.

## 2019-11-26 NOTE — Progress Notes (Signed)
Patient HR in 120s - cardizem drip increased to 15mg /hr per protocol. MD made aware. Will continue to monitor

## 2019-11-26 NOTE — Progress Notes (Signed)
Notified by tele that patient went into AFib, HR 120-140's.  Patient currently up in chair, talking with brother.  Patient denies any symptoms - said he just got a little upset on the phone speaking with him.  Patient returned to bed and EKG obtained  - showing AFib HR 132.  VS are WNL.  Dr. Rockne Menghini made aware via page.  Orders for cardizem drip placed.   Patient currently resting in bed, HR 114, Bp 120/74

## 2019-11-26 NOTE — Progress Notes (Signed)
Progress Note    Christopher Sullivan  [id number] DOB: 01-16-1956  DOA: 11/25/2019 PCP: Patient, No Pcp Per    Brief Narrative:   Chief complaint: Follow-up COVID-19 with associated pneumonia.  Medical records reviewed and are as summarized below:  Christopher Sullivan is an 63 y.o. male with a PMH of diabetes, hyperlipidemia, hypertension and GERD who was admitted 11/25/2019 with a chief complaint of diarrhea associated with general body aches in the setting of a positive COVID-19 exposure.  Also reported lower calf pain.  Covid testing subsequently positive.  Chest x-ray showed new hazy airspace opacities consistent with multifocal viral Covid pneumonia.  Assessment/Plan:   Principal Problem:   Pneumonia due to COVID-19 virus/lab test positive for detection of COVID-19 virus with associated elevated inflammatory markers including D-dimer, ferritin, and CRP X-ray personally reviewed and shows bilateral hazy airspace opacities, worse on the left.  Admitted and placed on Decadron and remdesivir.  Supportive care provided with zinc, vitamin C, antipyretics, antitussives as needed.  Diarrhea is likely associated with Covid, so will cancel GI pathogen profile.  The patient does not have any leukocytosis or fever at this time and this test would likely be low yield.  Consider Actemra given marked elevation of inflammatory markers and hypoxia, but currently lowest oxygen saturation is 90% on room air, so not needed yet.  Patient is well-appearing and denies SOB.  Active Problems:   New onset paroxysmal afib with RVR Called by RN re: tele showing afib w/ RVR.  Triggering event was an upsetting phone call with his family member but the patient reported being asymptomatic.  Check 12 lead EKG and get a set of vital signs.  10 mg Cardizem by slow IV push followed by 10 mg infusion per hour to titrate for heart rate control.    Hyperlipidemia associated with type 2 diabetes mellitus (HCC)/uncontrolled  diabetes Hemoglobin A1c 9.0 indicative of very poor outpatient control.  Currently being managed with Levemir 11 units twice daily and insulin sensitive SSI every 4 hours.  CBGs 160-313.  Monitor and adjust insulin to maintain euglycemia.    Essential hypertension Continue metoprolol.    Failure to thrive in adult Secondary to Covid pneumonia.  Will obtain PT/OT consultations.    Hypokalemia Replace potassium.    Hyperbilirubinemia Unclear etiology but will monitor.  Mildly elevated bilirubin may be reflective of hemoconcentration.    Hypoalbuminemia Likely indicative of malnourishment.    Nutritional status Body mass index is 29.21 kg/m.   Family Communication/Anticipated D/C date and plan/Code Status   DVT prophylaxis: Lovenox ordered. Code Status: Full Code.  Family Communication: Left message for spouse. Disposition Plan: Home when medically stable and when he completes his course of remdesivir which will be on 11/30/2019.   Medical Consultants:    None.   Anti-Infectives:    Remdesivir 11/25/2019 ---> 11/30/2019   Subjective:   Christopher Sullivan feels OK.  No SOB.  Has a mildly productive cough.  No nausea or diarrhea.    Objective:    Vitals:   11/26/19 0100 11/26/19 0156 11/26/19 0157 11/26/19 0422  BP: 135/80  107/69 132/74  Pulse: 88  84 87  Resp: (!) 24  20 18   Temp:   98.6 F (37 C) 97.8 F (36.6 C)  TempSrc:   Oral Oral  SpO2: 95%  94% 90%  Weight:  97.7 kg    Height:  6' (1.829 m)      Intake/Output Summary (Last 24 hours) at 11/26/2019 [phone]  Last data filed at 11/26/2019 [id number] Gross per 24 hour  Intake 856.48 ml  Output 100 ml  Net 756.48 ml   Filed Weights   11/26/19 0156  Weight: 97.7 kg    Exam: General: No acute distress. Cardiovascular: Heart sounds show a regular rate, and rhythm. No gallops or rubs. No murmurs. No JVD. Lungs: Clear to auscultation bilaterally with decent air movement. No rales, rhonchi or wheezes. Abdomen: Soft,  nontender, nondistended with normal active bowel sounds. No masses. No hepatosplenomegaly. Neurological: Alert and oriented 3. Moves all extremities 4 with equal strength. Cranial nerves II through XII grossly intact. Skin: Warm and dry. No rashes or lesions. Extremities: No clubbing or cyanosis. No edema. Pedal pulses 2+. Psychiatric: Mood and affect are normal. Insight and judgment are normal.  Data Reviewed:   I have personally reviewed following labs and imaging studies:  Labs: Labs show the following:   Basic Metabolic Panel: Recent Labs  Lab 11/20/19 1650 11/25/19 1322 11/26/19 0301  NA 133* 135  --   K 3.4* 3.4*  --   CL 96* 95*  --   CO2 24 25  --   GLUCOSE 157* 162*  --   BUN 16 14  --   CREATININE 1.24 1.04  --   CALCIUM 9.4 9.2  --   MG  --   --  2.1   GFR Estimated Creatinine Clearance: 86.9 mL/min (by C-G formula based on SCr of 1.04 mg/dL). Liver Function Tests: Recent Labs  Lab 11/20/19 1650 11/25/19 1322  AST 32 22  ALT 31 23  ALKPHOS 85 69  BILITOT 1.6* 1.3*  PROT 9.1* 8.3*  ALBUMIN 4.0 2.9*   CBC: Recent Labs  Lab 11/20/19 1650 11/25/19 1322 11/26/19 0301  WBC 7.8 8.1 6.4  NEUTROABS 6.1 6.6 5.5  HGB 17.5* 15.1 13.8  HCT 53.2* 46.0 43.1  MCV 88.1 88.0 89.2  PLT 183 237 251   CBG: Recent Labs  Lab 11/25/19 1832 11/25/19 2034 11/25/19 2353 11/26/19 0420 11/26/19 0815  GLUCAP 169* 313* 294* 187* 160*   D-Dimer: Recent Labs    11/25/19 1322 11/26/19 0301  DDIMER 1.67* 1.63*   Hgb A1c: Recent Labs    11/25/19 1322  HGBA1C 9.2*   Lipid Profile: Recent Labs    11/25/19 1224 11/25/19 1322  CHOL  --  83  HDL  --  29*  LDLCALC  --  36  TRIG 88 89  CHOLHDL  --  2.9   Anemia work up: Recent Labs    11/25/19 1224 11/26/19 0301  FERRITIN 610* 635*   Sepsis Labs: Recent Labs  Lab 11/20/19 1650 11/20/19 1719 11/25/19 1224 11/25/19 1322 11/26/19 0301  PROCALCITON  --   --   --  <0.10  --   WBC 7.8  --   --   8.1 6.4  LATICACIDVEN  --  1.8 1.6  --   --     Microbiology Recent Results (from the past 240 hour(s))  Novel Coronavirus, NAA ([facility])     Status: None   Collection Time: 11/19/19  8:55 AM   Specimen: Nasopharyngeal(NP) swabs in vial transport medium   NASOPHARYNGE  TESTING  Result Value Ref Range Status   SARS-CoV-2, NAA Not Detected Not Detected Final    Comment: This nucleic acid amplification test was developed and its performance characteristics determined by [employer]. Nucleic acid amplification tests include PCR and TMA. This test has not been FDA cleared or approved. This test has been  authorized by FDA under an Emergency Use Authorization (EUA). This test is only authorized for the duration of time the declaration that circumstances exist justifying the authorization of the emergency use of in vitro diagnostic tests for detection of SARS-CoV-2 virus and/or diagnosis of COVID-19 infection under section 564(b)(1) of the Act, 21 U.S.C. PT:2852782) (1), unless the authorization is terminated or revoked sooner. When diagnostic testing is negative, the possibility of a false negative result should be considered in the context of a patient's recent exposures and the presence of clinical signs and symptoms consistent with COVID-19. An individual without symptoms of COVID-19 and who is not shedding SARS-CoV-2 virus would  expect to have a negative (not detected) result in this assay.   Novel Coronavirus, NAA ([facility])     Status: None   Collection Time: 11/23/19  3:32 PM   Specimen: Nasopharyngeal(NP) swabs in vial transport medium   NASOPHARYNGE  TESTING  Result Value Ref Range Status   SARS-CoV-2, NAA Not Detected Not Detected Final    Comment: Testing was performed using the cobas(R) SARS-CoV-2 test. This nucleic acid amplification test was developed and its performance characteristics determined by [employer]. Nucleic acid amplification tests include  PCR and TMA. This test has not been FDA cleared or approved. This test has been authorized by FDA under an Emergency Use Authorization (EUA). This test is only authorized for the duration of time the declaration that circumstances exist justifying the authorization of the emergency use of in vitro diagnostic tests for detection of SARS-CoV-2 virus and/or diagnosis of COVID-19 infection under section 564(b)(1) of the Act, 21 U.S.C. PT:2852782) (1), unless the authorization is terminated or revoked sooner. When diagnostic testing is negative, the possibility of a false negative result should be considered in the context of a patient's recent exposures and the presence of clinical signs and symptoms consistent with COVID-19. An individual without symptoms  of COVID-19 and who is not shedding SARS-CoV-2 virus would expect to have a negative (not detected) result in this assay.   SARS CORONAVIRUS 2 (TAT 6-24 HRS) Nasopharyngeal Nasopharyngeal Swab     Status: Abnormal   Collection Time: 11/25/19  1:22 PM   Specimen: Nasopharyngeal Swab  Result Value Ref Range Status   SARS Coronavirus 2 POSITIVE (A) NEGATIVE Final    Comment: RESULT CALLED TO, READ BACK BY AND VERIFIED WITH: J.OXENDIEN RN 2041 11/25/2019 MCCORMICK K (NOTE) SARS-CoV-2 target nucleic acids are DETECTED. The SARS-CoV-2 RNA is generally detectable in upper and lower respiratory specimens during the acute phase of infection. Positive results are indicative of the presence of SARS-CoV-2 RNA. Clinical correlation with patient history and other diagnostic information is  necessary to determine patient infection status. Positive results do not rule out bacterial infection or co-infection with other viruses.  The expected result is Negative. Fact Sheet for Patients: SugarRoll.be Fact Sheet for Healthcare Providers: https://www.woods-mathews.com/ This test is not yet approved or cleared by  the [place] FDA and  has been authorized for detection and/or diagnosis of SARS-CoV-2 by FDA under an Emergency Use Authorization (EUA). This EUA will remain  in effect (meaning this test can be used) f or the duration of the COVID-19 declaration under Section 564(b)(1) of the Act, 21 U.S.C. section 360bbb-3(b)(1), unless the authorization is terminated or revoked sooner. Performed at [facility], [place] [address], [place], [place] [address]     Procedures and diagnostic studies:  DG Chest Port 1 View  Result Date: 11/25/2019 CLINICAL DATA:  COVID-19 positive,  cough EXAM: PORTABLE CHEST 1 VIEW COMPARISON:  11/20/2019 FINDINGS: The heart size and mediastinal contours are stable. New hazy airspace opacities within the peripheral aspects of the bilateral lung fields, most pronounced within the mid to lower lungs. No pleural effusion or pneumothorax. IMPRESSION: New hazy airspace opacities within the peripheral aspects of the bilateral lungs suspicious for multifocal viral pneumonia. Electronically Signed   By: Davina Poke D.O.   On: 11/25/2019 12:54    Medications:   . vitamin C  500 mg Oral Daily  . aspirin EC  81 mg Oral Daily  . dexamethasone (DECADRON) injection  6 mg Intravenous Q24H  . enoxaparin (LOVENOX) injection  40 mg Subcutaneous Q24H  . insulin aspart  0-9 Units Subcutaneous Q4H  . insulin detemir  0.1 Units/kg Subcutaneous BID  . metoprolol succinate  25 mg Oral Daily  . potassium chloride  40 mEq Oral Once  . simvastatin  40 mg Oral Daily  . zinc sulfate  220 mg Oral Daily   Continuous Infusions: . sodium chloride 75 mL/hr at 11/26/19 0600  . remdesivir 100 mg in NS 100 mL       LOS: 1 day   Jacquelynn Cree  [facility] Pager [phone].   [facility] How to contact the [facility] Attending or Consulting provider [place] or covering provider during after hours [place], for this patient?  1. Check the care team in [facility] and look for a)  attending/consulting [facility] provider listed and b) the [facility] team listed 2. Log into [email or web address] and use [place]'s universal password to access. If you do not have the password, please contact the hospital operator. 3. Locate the [facility] provider you are looking for under [facility] and page to a number that you can be directly reached. 4. If you still have difficulty reaching the provider, please page the [facility] (Director on Call) for the Hospitalists listed on amion for assistance.  11/26/2019, 8:17 AM

## 2019-11-27 DIAGNOSIS — J1282 Pneumonia due to coronavirus disease 2019: Secondary | ICD-10-CM

## 2019-11-27 LAB — CBC WITH DIFFERENTIAL/PLATELET
Abs Immature Granulocytes: 0.07 10*3/uL (ref 0.00–0.07)
Basophils Absolute: 0 10*3/uL (ref 0.0–0.1)
Basophils Relative: 0 %
Eosinophils Absolute: 0 10*3/uL (ref 0.0–0.5)
Eosinophils Relative: 0 %
HCT: 41.2 % (ref 39.0–52.0)
Hemoglobin: 13.5 g/dL (ref 13.0–17.0)
Immature Granulocytes: 1 %
Lymphocytes Relative: 7 %
Lymphs Abs: 0.7 10*3/uL (ref 0.7–4.0)
MCH: 28.7 pg (ref 26.0–34.0)
MCHC: 32.8 g/dL (ref 30.0–36.0)
MCV: 87.7 fL (ref 80.0–100.0)
Monocytes Absolute: 0.8 10*3/uL (ref 0.1–1.0)
Monocytes Relative: 7 %
Neutro Abs: 8.7 10*3/uL — ABNORMAL HIGH (ref 1.7–7.7)
Neutrophils Relative %: 85 %
Platelets: 279 10*3/uL (ref 150–400)
RBC: 4.7 MIL/uL (ref 4.22–5.81)
RDW: 12.6 % (ref 11.5–15.5)
WBC: 10.2 10*3/uL (ref 4.0–10.5)
nRBC: 0 % (ref 0.0–0.2)

## 2019-11-27 LAB — FERRITIN: Ferritin: 726 ng/mL — ABNORMAL HIGH (ref 24–336)

## 2019-11-27 LAB — COMPREHENSIVE METABOLIC PANEL
ALT: 24 U/L (ref 0–44)
AST: 25 U/L (ref 15–41)
Albumin: 2.4 g/dL — ABNORMAL LOW (ref 3.5–5.0)
Alkaline Phosphatase: 50 U/L (ref 38–126)
Anion gap: 9 (ref 5–15)
BUN: 24 mg/dL — ABNORMAL HIGH (ref 8–23)
CO2: 22 mmol/L (ref 22–32)
Calcium: 8.2 mg/dL — ABNORMAL LOW (ref 8.9–10.3)
Chloride: 105 mmol/L (ref 98–111)
Creatinine, Ser: 0.93 mg/dL (ref 0.61–1.24)
GFR calc Af Amer: >60 mL/min (ref >60)
GFR calc non Af Amer: >60 mL/min (ref >60)
Glucose, Bld: 224 mg/dL — ABNORMAL HIGH (ref 70–99)
Potassium: 3.9 mmol/L (ref 3.5–5.1)
Sodium: 136 mmol/L (ref 135–145)
Total Bilirubin: 0.8 mg/dL (ref 0.3–1.2)
Total Protein: 6.3 g/dL — ABNORMAL LOW (ref 6.5–8.1)

## 2019-11-27 LAB — MAGNESIUM: Magnesium: 2.2 mg/dL (ref 1.7–2.4)

## 2019-11-27 LAB — GLUCOSE, CAPILLARY
Glucose-Capillary: 175 mg/dL — ABNORMAL HIGH (ref 70–99)
Glucose-Capillary: 199 mg/dL — ABNORMAL HIGH (ref 70–99)
Glucose-Capillary: 203 mg/dL — ABNORMAL HIGH (ref 70–99)
Glucose-Capillary: 207 mg/dL — ABNORMAL HIGH (ref 70–99)
Glucose-Capillary: 220 mg/dL — ABNORMAL HIGH (ref 70–99)
Glucose-Capillary: 274 mg/dL — ABNORMAL HIGH (ref 70–99)

## 2019-11-27 LAB — C-REACTIVE PROTEIN: CRP: 9.5 mg/dL — ABNORMAL HIGH (ref <1.0)

## 2019-11-27 LAB — D-DIMER, QUANTITATIVE: D-Dimer, Quant: 1.41 ug/mL-FEU — ABNORMAL HIGH (ref 0.00–0.50)

## 2019-11-27 MED ORDER — INSULIN ASPART 100 UNIT/ML ~~LOC~~ SOLN
4.0000 [IU] | Freq: Three times a day (TID) | SUBCUTANEOUS | Status: DC
  Administered 2019-11-27 (×2): 4 [IU] via SUBCUTANEOUS

## 2019-11-27 MED ORDER — PANTOPRAZOLE SODIUM 40 MG PO TBEC
40.0000 mg | DELAYED_RELEASE_TABLET | Freq: Every day | ORAL | Status: DC
  Administered 2019-11-28 – 2019-11-29 (×2): 40 mg via ORAL
  Filled 2019-11-27 (×2): qty 1

## 2019-11-27 MED ORDER — INSULIN ASPART 100 UNIT/ML ~~LOC~~ SOLN
0.0000 [IU] | Freq: Three times a day (TID) | SUBCUTANEOUS | Status: DC
  Administered 2019-11-27: 8 [IU] via SUBCUTANEOUS
  Administered 2019-11-27 – 2019-11-28 (×3): 5 [IU] via SUBCUTANEOUS
  Administered 2019-11-28: 11 [IU] via SUBCUTANEOUS
  Administered 2019-11-29: 8 [IU] via SUBCUTANEOUS
  Administered 2019-11-29: 11 [IU] via SUBCUTANEOUS

## 2019-11-27 MED ORDER — ATORVASTATIN CALCIUM 20 MG PO TABS
20.0000 mg | ORAL_TABLET | Freq: Every day | ORAL | Status: DC
  Administered 2019-11-28 – 2019-11-29 (×2): 20 mg via ORAL
  Filled 2019-11-27 (×2): qty 1

## 2019-11-27 MED ORDER — INSULIN ASPART 100 UNIT/ML ~~LOC~~ SOLN
0.0000 [IU] | Freq: Every day | SUBCUTANEOUS | Status: DC
  Administered 2019-11-27: 2 [IU] via SUBCUTANEOUS
  Administered 2019-11-28: 3 [IU] via SUBCUTANEOUS

## 2019-11-27 MED ORDER — DILTIAZEM HCL ER 90 MG PO CP12
180.0000 mg | ORAL_CAPSULE | Freq: Two times a day (BID) | ORAL | Status: DC
  Administered 2019-11-27 (×2): 180 mg via ORAL
  Filled 2019-11-27 (×3): qty 2

## 2019-11-27 NOTE — Progress Notes (Addendum)
Progress Note    Christopher Sullivan  [id number] DOB: [birth year]  DOA: 11/25/2019 PCP: [name], MD    Brief Narrative:   Chief complaint: Follow-up COVID-19 with associated pneumonia.  Medical records reviewed and are as summarized below:  Christopher Sullivan is an 64 y.o. male with a PMH of diabetes, hyperlipidemia, hypertension and GERD who was admitted 11/25/2019 with a chief complaint of diarrhea associated with general body aches in the setting of a positive COVID-19 exposure.  Also reported lower calf pain.  Covid testing subsequently positive.  Chest x-ray showed new hazy airspace opacities consistent with multifocal viral Covid pneumonia.  Assessment/Plan:   Principal Problem:   Pneumonia due to COVID-19 virus/lab test positive for detection of COVID-19 virus with associated elevated inflammatory markers including D-dimer, ferritin, and CRP X-ray personally reviewed 11/26/2019 and showed bilateral hazy airspace opacities, worse on the left.  Admitted and placed on Decadron and remdesivir.  Supportive care provided with zinc, vitamin C, antipyretics, antitussives as needed.  Consider Actemra given marked elevation of inflammatory markers and hypoxia, but currently lowest oxygen saturation is 92-93% on room air, so not needed yet.  Patient is well-appearing and denies SOB.  D-dimer and CRP improved, ferritin rising.  Active Problems:   New onset paroxysmal afib with RVR Called by RN re: tele showing afib w/ RVR 11/26/2019.  Triggering event was an upsetting phone call with his family member but the patient reported being asymptomatic.  Twelve-lead EKG confirmed atrial fibrillation with RVR.  Troponin negative.  Was given a 10 mg Cardizem bolus and placed on a Cardizem drip at 15 mg/h to achieve rate control.  Will transition to oral Cardizem.  Given history of diabetes and hypertension, likely would benefit from systemic anticoagulation.  We will start him on Eliquis.  Will get 2D  echocardiogram for further risk stratification.    Hyperlipidemia associated with type 2 diabetes mellitus (HCC)/uncontrolled diabetes Hemoglobin A1c 9.0 indicative of very poor outpatient control.  Currently being managed with Levemir 11 units twice daily and insulin sensitive SSI every 4 hours.  CBGs 175-234.  Monitor and adjust insulin to maintain euglycemia.  Change SSI to moderate scale before meals and at bedtime with 4 units of meal coverage.    Essential hypertension Continue metoprolol.    Failure to thrive in adult Secondary to Covid pneumonia.  PT consultation requested.    Hypokalemia Normalized with supplementation.    Hyperbilirubinemia Mildly elevated bilirubin likely reflective of hemoconcentration.  Normalized with rehydration.    Hypoalbuminemia Likely indicative of malnourishment.    Nutritional status Body mass index is 29.21 kg/m.   Family Communication/Anticipated D/C date and plan/Code Status   DVT prophylaxis: Lovenox ordered. Code Status: Full Code.  Family Communication: Spouse updated by telephone. Disposition Plan: Home when medically stable and when he completes his course of remdesivir which will be on 11/30/2019.   Medical Consultants:    None.   Anti-Infectives:    Remdesivir 11/25/2019 ---> 11/30/2019   Subjective:   Christopher Sullivan denies palpitations, racing heart, SOB, cough.  Appetite is fair.  Says he had an episode of heart burn when he went into afib last night, but this resolved with antacids.   Objective:    Vitals:   11/26/19 2216 11/27/19 0507 11/27/19 0806 11/27/19 1123  BP: 106/70 112/65 108/66 (!) 103/58  Pulse: 70 66 71 84  Resp: 19 18 19 13   Temp: 98.4 F (36.9 C) 98.7 F (37.1 C) 98.3 F (36.8 C)  98.1 F (36.7 C)  TempSrc: Oral Oral Oral Oral  SpO2: 93% 92% 93% 93%  Weight:      Height:        Intake/Output Summary (Last 24 hours) at 11/27/2019 1421 Last data filed at 11/27/2019 1130 Gross per 24 hour  Intake  2026.59 ml  Output --  Net 2026.59 ml   Filed Weights   11/26/19 0156  Weight: 97.7 kg    Exam: General: No acute distress. Sitting up in chair in good spirits. Cardiovascular: Heart sounds show a regular rate, and rhythm. No gallops or rubs. No murmurs. No JVD. Lungs: Clear to auscultation bilaterally with good air movement. No rales, rhonchi or wheezes. Abdomen: Soft, nontender, nondistended with normal active bowel sounds. No masses. No hepatosplenomegaly. Skin: Warm and dry. No rashes or lesions. Extremities: No clubbing or cyanosis. No edema. Pedal pulses 2+.  Data Reviewed:   I have personally reviewed following labs and imaging studies:  Labs: Labs show the following:   Basic Metabolic Panel: Recent Labs  Lab 11/20/19 1650 11/25/19 1322 11/26/19 0301 11/27/19 0229  NA 133* 135  --  136  K 3.4* 3.4*  --  3.9  CL 96* 95*  --  105  CO2 24 25  --  22  GLUCOSE 157* 162*  --  224*  BUN 16 14  --  24*  CREATININE 1.24 1.04  --  0.93  CALCIUM 9.4 9.2  --  8.2*  MG  --   --  2.1 2.2   GFR Estimated Creatinine Clearance: 97.2 mL/min (by C-G formula based on SCr of 0.93 mg/dL). Liver Function Tests: Recent Labs  Lab 11/20/19 1650 11/25/19 1322 11/27/19 0229  AST 32 22 25  ALT 31 23 24   ALKPHOS 85 69 50  BILITOT 1.6* 1.3* 0.8  PROT 9.1* 8.3* 6.3*  ALBUMIN 4.0 2.9* 2.4*   CBC: Recent Labs  Lab 11/20/19 1650 11/25/19 1322 11/26/19 0301 11/27/19 0229  WBC 7.8 8.1 6.4 10.2  NEUTROABS 6.1 6.6 5.5 8.7*  HGB 17.5* 15.1 13.8 13.5  HCT 53.2* 46.0 43.1 41.2  MCV 88.1 88.0 89.2 87.7  PLT 183 237 251 279   CBG: Recent Labs  Lab 11/26/19 2026 11/27/19 0027 11/27/19 0500 11/27/19 0755 11/27/19 1120  GLUCAP 234* 199* 203* 175* 207*   D-Dimer: Recent Labs    11/26/19 0301 11/27/19 0229  DDIMER 1.63* 1.41*   Hgb A1c: Recent Labs    11/25/19 1322  HGBA1C 9.2*   Lipid Profile: Recent Labs    11/25/19 1224 11/25/19 1322  CHOL  --  83  HDL  --   29*  LDLCALC  --  36  TRIG 88 89  CHOLHDL  --  2.9   Anemia work up: Recent Labs    11/26/19 0301 11/27/19 0229  FERRITIN 635* 726*   Sepsis Labs: Recent Labs  Lab 11/20/19 1650 11/20/19 1719 11/25/19 1224 11/25/19 1322 11/26/19 0301 11/27/19 0229  PROCALCITON  --   --   --  <0.10  --   --   WBC 7.8  --   --  8.1 6.4 10.2  LATICACIDVEN  --  1.8 1.6  --   --   --     Microbiology Recent Results (from the past 240 hour(s))  Novel Coronavirus, NAA ([facility])     Status: None   Collection Time: 11/19/19  8:55 AM   Specimen: Nasopharyngeal(NP) swabs in vial transport medium   NASOPHARYNGE  TESTING  Result Value Ref  Range Status   SARS-CoV-2, NAA Not Detected Not Detected Final    Comment: This nucleic acid amplification test was developed and its performance characteristics determined by [employer]. Nucleic acid amplification tests include PCR and TMA. This test has not been FDA cleared or approved. This test has been authorized by FDA under an Emergency Use Authorization (EUA). This test is only authorized for the duration of time the declaration that circumstances exist justifying the authorization of the emergency use of in vitro diagnostic tests for detection of SARS-CoV-2 virus and/or diagnosis of COVID-19 infection under section 564(b)(1) of the Act, 21 U.S.C. PT:2852782) (1), unless the authorization is terminated or revoked sooner. When diagnostic testing is negative, the possibility of a false negative result should be considered in the context of a patient's recent exposures and the presence of clinical signs and symptoms consistent with COVID-19. An individual without symptoms of COVID-19 and who is not shedding SARS-CoV-2 virus would  expect to have a negative (not detected) result in this assay.   Novel Coronavirus, NAA ([facility])     Status: None   Collection Time: 11/23/19  3:32 PM   Specimen: Nasopharyngeal(NP) swabs in vial transport medium    NASOPHARYNGE  TESTING  Result Value Ref Range Status   SARS-CoV-2, NAA Not Detected Not Detected Final    Comment: Testing was performed using the cobas(R) SARS-CoV-2 test. This nucleic acid amplification test was developed and its performance characteristics determined by [employer]. Nucleic acid amplification tests include PCR and TMA. This test has not been FDA cleared or approved. This test has been authorized by FDA under an Emergency Use Authorization (EUA). This test is only authorized for the duration of time the declaration that circumstances exist justifying the authorization of the emergency use of in vitro diagnostic tests for detection of SARS-CoV-2 virus and/or diagnosis of COVID-19 infection under section 564(b)(1) of the Act, 21 U.S.C. PT:2852782) (1), unless the authorization is terminated or revoked sooner. When diagnostic testing is negative, the possibility of a false negative result should be considered in the context of a patient's recent exposures and the presence of clinical signs and symptoms consistent with COVID-19. An individual without symptoms  of COVID-19 and who is not shedding SARS-CoV-2 virus would expect to have a negative (not detected) result in this assay.   Blood Culture (routine x 2)     Status: None (Preliminary result)   Collection Time: 11/25/19 12:29 PM   Specimen: Right Antecubital; Blood  Result Value Ref Range Status   Specimen Description   Final    RIGHT ANTECUBITAL Performed at [facility] [address] [place], [place] [address]    Special Requests   Final    BOTTLES DRAWN AEROBIC AND ANAEROBIC Blood Culture adequate volume Performed at [facility] [address], [place], [place] [address]    Culture   Final    NO GROWTH 2 DAYS Performed at [facility] [address], [place], [place] [address]    Report Status PENDING  Incomplete  SARS CORONAVIRUS 2 (TAT 6-24 HRS)  Nasopharyngeal Nasopharyngeal Swab     Status: Abnormal   Collection Time: 11/25/19  1:22 PM   Specimen: Nasopharyngeal Swab  Result Value Ref Range Status   SARS Coronavirus 2 POSITIVE (A) NEGATIVE Final    Comment: RESULT CALLED TO, READ BACK BY AND VERIFIED WITH: J.OXENDIEN RN 2041 11/25/2019 MCCORMICK K (NOTE) SARS-CoV-2 target nucleic acids are DETECTED. The SARS-CoV-2 RNA is generally  detectable in upper and lower respiratory specimens during the acute phase of infection. Positive results are indicative of the presence of SARS-CoV-2 RNA. Clinical correlation with patient history and other diagnostic information is  necessary to determine patient infection status. Positive results do not rule out bacterial infection or co-infection with other viruses.  The expected result is Negative. Fact Sheet for Patients: SugarRoll.be Fact Sheet for Healthcare Providers: https://www.woods-mathews.com/ This test is not yet approved or cleared by the [place] FDA and  has been authorized for detection and/or diagnosis of SARS-CoV-2 by FDA under an Emergency Use Authorization (EUA). This EUA will remain  in effect (meaning this test can be used) f or the duration of the COVID-19 declaration under Section 564(b)(1) of the Act, 21 U.S.C. section 360bbb-3(b)(1), unless the authorization is terminated or revoked sooner. Performed at [facility], [place] [address], [place], [place] [address]   Blood Culture (routine x 2)     Status: None (Preliminary result)   Collection Time: 11/25/19  1:22 PM   Specimen: BLOOD  Result Value Ref Range Status   Specimen Description   Final    BLOOD LEFT ANTECUBITAL Performed at [facility] [address], [place], [place] [address]    Special Requests   Final    BOTTLES DRAWN AEROBIC AND ANAEROBIC Blood Culture adequate volume Performed at [facility] [address] [place], [place] [address]    Culture   Final    NO GROWTH 2 DAYS Performed at [facility] [address], [place], [place] [address]    Report Status PENDING  Incomplete    Procedures and diagnostic studies:  VAS [place] LOWER EXTREMITY VENOUS (DVT)  Result Date: 11/26/2019  Lower Venous Study Indications: Pain.  Risk Factors: COVID 19 positive. Comparison Study: No prior studies. Performing Technologist: Oliver Hum RVT  Examination Guidelines: A complete evaluation includes B-mode imaging, spectral Doppler, color Doppler, and power Doppler as needed of all accessible portions of each vessel. Bilateral testing is considered an integral part of a complete examination. Limited examinations for reoccurring indications may be performed as noted.  +---------+---------------+---------+-----------+----------+--------------+ RIGHT    CompressibilityPhasicitySpontaneityPropertiesThrombus Aging +---------+---------------+---------+-----------+----------+--------------+ CFV      Full           Yes      Yes                                 +---------+---------------+---------+-----------+----------+--------------+ SFJ      Full                                                        +---------+---------------+---------+-----------+----------+--------------+ FV Prox  Full                                                        +---------+---------------+---------+-----------+----------+--------------+ FV Mid   Full                                                        +---------+---------------+---------+-----------+----------+--------------+  FV DistalFull                                                        +---------+---------------+---------+-----------+----------+--------------+ PFV      Full                                                        +---------+---------------+---------+-----------+----------+--------------+ POP      Full           Yes       Yes                                 +---------+---------------+---------+-----------+----------+--------------+ PTV      Full                                                        +---------+---------------+---------+-----------+----------+--------------+ PERO     Full                                                        +---------+---------------+---------+-----------+----------+--------------+   +---------+---------------+---------+-----------+----------+--------------+ LEFT     CompressibilityPhasicitySpontaneityPropertiesThrombus Aging +---------+---------------+---------+-----------+----------+--------------+ CFV      Full           Yes      Yes                                 +---------+---------------+---------+-----------+----------+--------------+ SFJ      Full                                                        +---------+---------------+---------+-----------+----------+--------------+ FV Prox  Full                                                        +---------+---------------+---------+-----------+----------+--------------+ FV Mid   Full                                                        +---------+---------------+---------+-----------+----------+--------------+ FV DistalFull                                                        +---------+---------------+---------+-----------+----------+--------------+  PFV      Full                                                        +---------+---------------+---------+-----------+----------+--------------+ POP      Full           Yes      Yes                                 +---------+---------------+---------+-----------+----------+--------------+ PTV      Full                                                        +---------+---------------+---------+-----------+----------+--------------+ PERO     Full                                                         +---------+---------------+---------+-----------+----------+--------------+     Summary: Right: There is no evidence of deep vein thrombosis in the lower extremity. No cystic structure found in the popliteal fossa. Left: There is no evidence of deep vein thrombosis in the lower extremity. No cystic structure found in the popliteal fossa.  *See table(s) above for measurements and observations. Electronically signed by Deitra Mayo MD on 11/26/2019 at 1:29:23 PM.    Final     Medications:   . vitamin C  500 mg Oral Daily  . aspirin EC  81 mg Oral Daily  . [START ON 11/28/2019] atorvastatin  20 mg Oral Daily  . dexamethasone (DECADRON) injection  6 mg Intravenous Q24H  . diltiazem  180 mg Oral Q12H  . enoxaparin (LOVENOX) injection  40 mg Subcutaneous Q24H  . insulin aspart  0-15 Units Subcutaneous TID WC  . insulin aspart  0-5 Units Subcutaneous QHS  . insulin aspart  4 Units Subcutaneous TID WC  . insulin detemir  0.1 Units/kg Subcutaneous BID  . metoprolol succinate  25 mg Oral Daily  . zinc sulfate  220 mg Oral Daily   Continuous Infusions: . sodium chloride 75 mL/hr at 11/27/19 0633  . diltiazem (CARDIZEM) infusion 15 mg/hr (11/27/19 0801)  . remdesivir 100 mg in NS 100 mL 100 mg (11/27/19 0801)     LOS: 2 days   Jacquelynn Cree  [facility] Pager [phone].   [facility] How to contact the [facility] Attending or Consulting provider [place] or covering provider during after hours [place], for this patient?  1. Check the care team in [facility] and look for a) attending/consulting [facility] provider listed and b) the [facility] team listed 2. Log into [email or web address] and use 's universal password to access. If you do not have the password, please contact the hospital operator. 3. Locate the [facility] provider you are looking for under [facility] and page to a number that you can be directly reached. 4. If you still have difficulty reaching the provider, please page the [facility]  (Director on Call) for  the Hospitalists listed on amion for assistance.  11/27/2019, 2:21 PM

## 2019-11-28 ENCOUNTER — Inpatient Hospital Stay (HOSPITAL_COMMUNITY): Payer: BC Managed Care – PPO

## 2019-11-28 DIAGNOSIS — I480 Paroxysmal atrial fibrillation: Secondary | ICD-10-CM

## 2019-11-28 LAB — CBC WITH DIFFERENTIAL/PLATELET
Abs Immature Granulocytes: 0.07 10*3/uL (ref 0.00–0.07)
Basophils Absolute: 0 10*3/uL (ref 0.0–0.1)
Basophils Relative: 0 %
Eosinophils Absolute: 0 10*3/uL (ref 0.0–0.5)
Eosinophils Relative: 0 %
HCT: 41.9 % (ref 39.0–52.0)
Hemoglobin: 13.1 g/dL (ref 13.0–17.0)
Immature Granulocytes: 1 %
Lymphocytes Relative: 9 %
Lymphs Abs: 0.6 10*3/uL — ABNORMAL LOW (ref 0.7–4.0)
MCH: 28.2 pg (ref 26.0–34.0)
MCHC: 31.3 g/dL (ref 30.0–36.0)
MCV: 90.1 fL (ref 80.0–100.0)
Monocytes Absolute: 0.3 10*3/uL (ref 0.1–1.0)
Monocytes Relative: 5 %
Neutro Abs: 6 10*3/uL (ref 1.7–7.7)
Neutrophils Relative %: 85 %
Platelets: 301 10*3/uL (ref 150–400)
RBC: 4.65 MIL/uL (ref 4.22–5.81)
RDW: 12.7 % (ref 11.5–15.5)
WBC: 7 10*3/uL (ref 4.0–10.5)
nRBC: 0 % (ref 0.0–0.2)

## 2019-11-28 LAB — GLUCOSE, CAPILLARY
Glucose-Capillary: 218 mg/dL — ABNORMAL HIGH (ref 70–99)
Glucose-Capillary: 303 mg/dL — ABNORMAL HIGH (ref 70–99)
Glucose-Capillary: 224 mg/dL — ABNORMAL HIGH (ref 70–99)
Glucose-Capillary: 280 mg/dL — ABNORMAL HIGH (ref 70–99)

## 2019-11-28 LAB — ECHOCARDIOGRAM COMPLETE
Height: 72 in
Weight: 3446.23 oz

## 2019-11-28 LAB — FERRITIN: Ferritin: 518 ng/mL — ABNORMAL HIGH (ref 24–336)

## 2019-11-28 LAB — D-DIMER, QUANTITATIVE: D-Dimer, Quant: 1.25 ug/mL-FEU — ABNORMAL HIGH (ref 0.00–0.50)

## 2019-11-28 LAB — MAGNESIUM: Magnesium: 2 mg/dL (ref 1.7–2.4)

## 2019-11-28 LAB — C-REACTIVE PROTEIN: CRP: 4 mg/dL — ABNORMAL HIGH (ref <1.0)

## 2019-11-28 MED ORDER — DILTIAZEM HCL ER 60 MG PO CP12
120.0000 mg | ORAL_CAPSULE | Freq: Two times a day (BID) | ORAL | Status: DC
  Administered 2019-11-28 – 2019-11-29 (×3): 120 mg via ORAL
  Filled 2019-11-28 (×4): qty 2

## 2019-11-28 MED ORDER — INSULIN ASPART 100 UNIT/ML ~~LOC~~ SOLN
5.0000 [IU] | Freq: Three times a day (TID) | SUBCUTANEOUS | Status: DC
  Administered 2019-11-28 – 2019-11-29 (×5): 5 [IU] via SUBCUTANEOUS

## 2019-11-28 MED ORDER — APIXABAN 5 MG PO TABS
5.0000 mg | ORAL_TABLET | Freq: Two times a day (BID) | ORAL | Status: DC
  Administered 2019-11-28 – 2019-11-29 (×3): 5 mg via ORAL
  Filled 2019-11-28 (×3): qty 1

## 2019-11-28 MED ORDER — SODIUM CHLORIDE 0.9 % IV SOLN: 1000.0000 mL | INTRAVENOUS | Status: DC

## 2019-11-28 MED ORDER — INSULIN DETEMIR 100 UNIT/ML ~~LOC~~ SOLN
0.2000 [IU]/kg | Freq: Two times a day (BID) | SUBCUTANEOUS | Status: DC
  Administered 2019-11-28 – 2019-11-29 (×3): 21 [IU] via SUBCUTANEOUS
  Filled 2019-11-28 (×4): qty 0.21

## 2019-11-28 NOTE — Progress Notes (Addendum)
Progress Note    Christopher Sullivan  [id number] DOB: February 14, 1956  DOA: 11/25/2019 PCP: [name], MD    Brief Narrative:   Chief complaint: Follow-up COVID-19 with associated pneumonia.  Medical records reviewed and are as summarized below:  Christopher Sullivan is an 64 y.o. male with a PMH of diabetes, hyperlipidemia, hypertension and GERD who was admitted 11/25/2019 with a chief complaint of diarrhea associated with general body aches in the setting of a positive COVID-19 exposure.  Also reported lower calf pain.  Covid testing subsequently positive.  Chest x-ray showed new hazy airspace opacities consistent with multifocal viral Covid pneumonia.  Assessment/Plan:   Principal Problem:   Pneumonia due to COVID-19 virus/lab test positive for detection of COVID-19 virus with associated elevated inflammatory markers including D-dimer, ferritin, and CRP X-ray personally reviewed 11/26/2019 and showed bilateral hazy airspace opacities, worse on the left.  Admitted and placed on Decadron and remdesivir.  Supportive care provided with zinc, vitamin C, antipyretics, antitussives as needed.  Had an isolated low oxygen saturation of 89% overnight, but currently 94% on room air.  Inflammatory markers improving, CRP 9.5---> 4.0, ferritin 726---> 518, D-dimer 1.41---> 1.25.  Right lower extremity Dopplers negative for DVT.  Active Problems:   New onset paroxysmal afib with RVR Called by RN re: tele showing afib w/ RVR 11/26/2019.  Triggering event was an upsetting phone call with his family member but the patient reported being asymptomatic.  Twelve-lead EKG confirmed atrial fibrillation with RVR.  Troponin negative x 2.  Was given a 10 mg Cardizem bolus and placed on a Cardizem drip at 15 mg/h to achieve rate control.  Transitioned to oral Cardizem 11/27/2019.  After discussing with the patient, the patient was started on Eliquis for systemic anticoagulation 11/27/2019.  2D echocardiogram pending.  Heart rate  40s-50s overnight.  EKG rechecked and personally reviewed, showed ongoing atrial fibrillation with controlled rate. Cardizem dosage reduced with parameters to hold.  Remains on home dose of metoprolol as well.    Hyperlipidemia associated with type 2 diabetes mellitus (HCC)/uncontrolled diabetes Hemoglobin A1c 9.0 indicative of very poor outpatient control.  Currently being managed with Levemir 11 units twice daily and moderate scale SSI with 4 units of meal coverage.  CBGs 175-274.  Increase Levemir to 21 units BID and change meal coverage to 5 units.  Steroids contributing to insulin resistance.    Essential hypertension Continue metoprolol.    Failure to thrive in adult Secondary to Covid pneumonia.  PT consultation requested.    Hypokalemia Normalized with supplementation.    Hyperbilirubinemia Mildly elevated bilirubin likely reflective of hemoconcentration.  Normalized with rehydration.    Hypoalbuminemia Likely indicative of malnourishment.    Nutritional status Body mass index is 29.21 kg/m.   Family Communication/Anticipated D/C date and plan/Code Status   DVT prophylaxis: Eliquis ordered. Code Status: Full Code.  Family Communication: Spouse updated by telephone. Disposition Plan: Home when medically stable and when he completes his course of remdesivir which will be on 11/29/2019.  Awaiting PT evaluation to see if home services or DME will be needed.   Medical Consultants:    None.   Anti-Infectives:    Remdesivir 11/25/2019 ---> 11/29/2019   Subjective:   Christopher Sullivan feels OK.  Sitting up in the chair, eating breakfast.  He wanted me to look at a sore spot on his left scrotum (large skin tag), and I reassured him this was nothing to worry about, but if it gets irritated, he can  have a dermatologist remove it.   Objective:    Vitals:   11/27/19 2002 11/27/19 2056 11/28/19 0021 11/28/19 0435  BP: 94/78 101/65 103/67 104/63  Pulse: 64 (!) 58 (!) 56 (!) 48   Resp: 20  18 20   Temp: 98.2 F (36.8 C)  98.7 F (37.1 C) 98.4 F (36.9 C)  TempSrc: Oral  Oral Oral  SpO2: (!) 89%  95% 94%  Weight:      Height:        Intake/Output Summary (Last 24 hours) at 11/28/2019 0743 Last data filed at 11/28/2019 0604 Gross per 24 hour  Intake 2113.8 ml  Output --  Net 2113.8 ml   Filed Weights   11/26/19 0156  Weight: 97.7 kg    Exam: General: No acute distress. Cardiovascular: HSIR. No gallops or rubs. No murmurs. No JVD. Lungs: Clear to auscultation bilaterally with good air movement. No rales, rhonchi or wheezes. Abdomen: Soft, nontender, nondistended with normal active bowel sounds. No masses. No hepatosplenomegaly. Skin: Warm and dry. No rashes or lesions. Extremities: No clubbing or cyanosis. No edema. Pedal pulses 2+.  Data Reviewed:   I have personally reviewed following labs and imaging studies:  Labs: Labs show the following:   Basic Metabolic Panel: Recent Labs  Lab 11/25/19 1322 11/26/19 0301 11/27/19 0229 11/28/19 0207  NA 135  --  136  --   K 3.4*  --  3.9  --   CL 95*  --  105  --   CO2 25  --  22  --   GLUCOSE 162*  --  224*  --   BUN 14  --  24*  --   CREATININE 1.04  --  0.93  --   CALCIUM 9.2  --  8.2*  --   MG  --  2.1 2.2 2.0   GFR Estimated Creatinine Clearance: 97.2 mL/min (by C-G formula based on SCr of 0.93 mg/dL). Liver Function Tests: Recent Labs  Lab 11/25/19 1322 11/27/19 0229  AST 22 25  ALT 23 24  ALKPHOS 69 50  BILITOT 1.3* 0.8  PROT 8.3* 6.3*  ALBUMIN 2.9* 2.4*   CBC: Recent Labs  Lab 11/25/19 1322 11/26/19 0301 11/27/19 0229 11/28/19 0207  WBC 8.1 6.4 10.2 7.0  NEUTROABS 6.6 5.5 8.7* 6.0  HGB 15.1 13.8 13.5 13.1  HCT 46.0 43.1 41.2 41.9  MCV 88.0 89.2 87.7 90.1  PLT 237 251 279 301   CBG: Recent Labs  Lab 11/27/19 0500 11/27/19 0755 11/27/19 1120 11/27/19 1650 11/27/19 2000  GLUCAP 203* 175* 207* 274* 220*   D-Dimer: Recent Labs    11/27/19 0229 11/28/19 0207   DDIMER 1.41* 1.25*   Hgb A1c: Recent Labs    11/25/19 1322  HGBA1C 9.2*   Lipid Profile: Recent Labs    11/25/19 1224 11/25/19 1322  CHOL  --  83  HDL  --  29*  LDLCALC  --  36  TRIG 88 89  CHOLHDL  --  2.9   Anemia work up: Recent Labs    11/27/19 0229 11/28/19 0207  FERRITIN 726* 518*   Sepsis Labs: Recent Labs  Lab 11/25/19 1224 11/25/19 1322 11/26/19 0301 11/27/19 0229 11/28/19 0207  PROCALCITON  --  <0.10  --   --   --   WBC  --  8.1 6.4 10.2 7.0  LATICACIDVEN 1.6  --   --   --   --     Microbiology Recent Results (from the past 240  hour(s))  Novel Coronavirus, NAA ([facility])     Status: None   Collection Time: 11/19/19  8:55 AM   Specimen: Nasopharyngeal(NP) swabs in vial transport medium   NASOPHARYNGE  TESTING  Result Value Ref Range Status   SARS-CoV-2, NAA Not Detected Not Detected Final    Comment: This nucleic acid amplification test was developed and its performance characteristics determined by [employer]. Nucleic acid amplification tests include PCR and TMA. This test has not been FDA cleared or approved. This test has been authorized by FDA under an Emergency Use Authorization (EUA). This test is only authorized for the duration of time the declaration that circumstances exist justifying the authorization of the emergency use of in vitro diagnostic tests for detection of SARS-CoV-2 virus and/or diagnosis of COVID-19 infection under section 564(b)(1) of the Act, 21 U.S.C. PT:2852782) (1), unless the authorization is terminated or revoked sooner. When diagnostic testing is negative, the possibility of a false negative result should be considered in the context of a patient's recent exposures and the presence of clinical signs and symptoms consistent with COVID-19. An individual without symptoms of COVID-19 and who is not shedding SARS-CoV-2 virus would  expect to have a negative (not detected) result in this assay.   Novel  Coronavirus, NAA ([facility])     Status: None   Collection Time: 11/23/19  3:32 PM   Specimen: Nasopharyngeal(NP) swabs in vial transport medium   NASOPHARYNGE  TESTING  Result Value Ref Range Status   SARS-CoV-2, NAA Not Detected Not Detected Final    Comment: Testing was performed using the cobas(R) SARS-CoV-2 test. This nucleic acid amplification test was developed and its performance characteristics determined by [employer]. Nucleic acid amplification tests include PCR and TMA. This test has not been FDA cleared or approved. This test has been authorized by FDA under an Emergency Use Authorization (EUA). This test is only authorized for the duration of time the declaration that circumstances exist justifying the authorization of the emergency use of in vitro diagnostic tests for detection of SARS-CoV-2 virus and/or diagnosis of COVID-19 infection under section 564(b)(1) of the Act, 21 U.S.C. [id number]) (1), unless the authorization is terminated or revoked sooner. When diagnostic testing is negative, the possibility of a false negative result should be considered in the context of a patient's recent exposures and the presence of clinical signs and symptoms consistent with COVID-19. An individual without symptoms  of COVID-19 and who is not shedding SARS-CoV-2 virus would expect to have a negative (not detected) result in this assay.   Blood Culture (routine x 2)     Status: None (Preliminary result)   Collection Time: 11/25/19 12:29 PM   Specimen: Right Antecubital; Blood  Result Value Ref Range Status   Specimen Description   Final    RIGHT ANTECUBITAL Performed at [facility] [address] [place], [place] [address]    Special Requests   Final    BOTTLES DRAWN AEROBIC AND ANAEROBIC Blood Culture adequate volume Performed at [facility] [address], [place], [place] [address]    Culture   Final    NO GROWTH 2  DAYS Performed at [facility] [address] [place], [place] [address]    Report Status PENDING  Incomplete  SARS CORONAVIRUS 2 (TAT 6-24 HRS) Nasopharyngeal Nasopharyngeal Swab     Status: Abnormal   Collection Time: 11/25/19  1:22 PM   Specimen: Nasopharyngeal Swab  Result Value Ref Range Status  SARS Coronavirus 2 POSITIVE (A) NEGATIVE Final    Comment: RESULT CALLED TO, READ BACK BY AND VERIFIED WITH: J.OXENDIEN RN 2041 11/25/2019 MCCORMICK K (NOTE) SARS-CoV-2 target nucleic acids are DETECTED. The SARS-CoV-2 RNA is generally detectable in upper and lower respiratory specimens during the acute phase of infection. Positive results are indicative of the presence of SARS-CoV-2 RNA. Clinical correlation with patient history and other diagnostic information is  necessary to determine patient infection status. Positive results do not rule out bacterial infection or co-infection with other viruses.  The expected result is Negative. Fact Sheet for Patients: SugarRoll.be Fact Sheet for Healthcare Providers: https://www.woods-mathews.com/ This test is not yet approved or cleared by the [place] FDA and  has been authorized for detection and/or diagnosis of SARS-CoV-2 by FDA under an Emergency Use Authorization (EUA). This EUA will remain  in effect (meaning this test can be used) f or the duration of the COVID-19 declaration under Section 564(b)(1) of the Act, 21 U.S.C. section 360bbb-3(b)(1), unless the authorization is terminated or revoked sooner. Performed at [facility], [place] [address], [place], [place] [address]   Blood Culture (routine x 2)     Status: None (Preliminary result)   Collection Time: 11/25/19  1:22 PM   Specimen: BLOOD  Result Value Ref Range Status   Specimen Description   Final    BLOOD LEFT ANTECUBITAL Performed at [facility] [address] [place], [place] [address]     Special Requests   Final    BOTTLES DRAWN AEROBIC AND ANAEROBIC Blood Culture adequate volume Performed at [facility] [address] [place], [place] [address]    Culture   Final    NO GROWTH 2 DAYS Performed at [facility] [address] [place], [place] [address]    Report Status PENDING  Incomplete    Procedures and diagnostic studies:  VAS [place] LOWER EXTREMITY VENOUS (DVT)  Result Date: 11/26/2019  Lower Venous Study Indications: Pain.  Risk Factors: COVID 19 positive. Comparison Study: No prior studies. Performing Technologist: Oliver Hum RVT  Examination Guidelines: A complete evaluation includes B-mode imaging, spectral Doppler, color Doppler, and power Doppler as needed of all accessible portions of each vessel. Bilateral testing is considered an integral part of a complete examination. Limited examinations for reoccurring indications may be performed as noted.  +---------+---------------+---------+-----------+----------+--------------+ RIGHT    CompressibilityPhasicitySpontaneityPropertiesThrombus Aging +---------+---------------+---------+-----------+----------+--------------+ CFV      Full           Yes      Yes                                 +---------+---------------+---------+-----------+----------+--------------+ SFJ      Full                                                        +---------+---------------+---------+-----------+----------+--------------+ FV Prox  Full                                                        +---------+---------------+---------+-----------+----------+--------------+ FV Mid   Full                                                        +---------+---------------+---------+-----------+----------+--------------+  FV DistalFull                                                        +---------+---------------+---------+-----------+----------+--------------+ PFV      Full                                                         +---------+---------------+---------+-----------+----------+--------------+ POP      Full           Yes      Yes                                 +---------+---------------+---------+-----------+----------+--------------+ PTV      Full                                                        +---------+---------------+---------+-----------+----------+--------------+ PERO     Full                                                        +---------+---------------+---------+-----------+----------+--------------+   +---------+---------------+---------+-----------+----------+--------------+ LEFT     CompressibilityPhasicitySpontaneityPropertiesThrombus Aging +---------+---------------+---------+-----------+----------+--------------+ CFV      Full           Yes      Yes                                 +---------+---------------+---------+-----------+----------+--------------+ SFJ      Full                                                        +---------+---------------+---------+-----------+----------+--------------+ FV Prox  Full                                                        +---------+---------------+---------+-----------+----------+--------------+ FV Mid   Full                                                        +---------+---------------+---------+-----------+----------+--------------+ FV DistalFull                                                        +---------+---------------+---------+-----------+----------+--------------+  PFV      Full                                                        +---------+---------------+---------+-----------+----------+--------------+ POP      Full           Yes      Yes                                 +---------+---------------+---------+-----------+----------+--------------+ PTV      Full                                                         +---------+---------------+---------+-----------+----------+--------------+ PERO     Full                                                        +---------+---------------+---------+-----------+----------+--------------+     Summary: Right: There is no evidence of deep vein thrombosis in the lower extremity. No cystic structure found in the popliteal fossa. Left: There is no evidence of deep vein thrombosis in the lower extremity. No cystic structure found in the popliteal fossa.  *See table(s) above for measurements and observations. Electronically signed by Deitra Mayo MD on 11/26/2019 at 1:29:23 PM.    Final     Medications:   . vitamin C  500 mg Oral Daily  . aspirin EC  81 mg Oral Daily  . atorvastatin  20 mg Oral Daily  . dexamethasone (DECADRON) injection  6 mg Intravenous Q24H  . diltiazem  180 mg Oral Q12H  . enoxaparin (LOVENOX) injection  40 mg Subcutaneous Q24H  . insulin aspart  0-15 Units Subcutaneous TID WC  . insulin aspart  0-5 Units Subcutaneous QHS  . insulin aspart  4 Units Subcutaneous TID WC  . insulin detemir  0.1 Units/kg Subcutaneous BID  . metoprolol succinate  25 mg Oral Daily  . pantoprazole  40 mg Oral Q0600  . zinc sulfate  220 mg Oral Daily   Continuous Infusions: . sodium chloride 75 mL/hr at 11/28/19 0604  . diltiazem (CARDIZEM) infusion Stopped (11/27/19 2103)  . remdesivir 100 mg in NS 100 mL Stopped (11/27/19 0849)     LOS: 3 days   Margreta Journey Alantis Bethune  [facility]   [facility] How to contact the [facility] Attending or Consulting provider [place] or covering provider during after hours [place], for this patient?  1. Check the care team in [facility] and look for a) attending/consulting [facility] provider listed and b) the [facility] Gastroenterology team listed 2. Log into [email or web address] and use [facility]'s universal password to access. If you do not have the password, please contact the hospital operator. 3. Locate the [facility] provider you are looking for under [facility] and page to a number that you can be directly reached. 4. If you still have difficulty reaching the provider, please page the [facility] (Director on Call) for  the Hospitalists listed on amion for assistance.  11/28/2019, 7:43 AM

## 2019-11-28 NOTE — Progress Notes (Signed)
[place] for Eliquis Indication: atrial fibrillation with RVR  Allergies  Allergen Reactions  . Lisinopril Swelling    Patient Measurements: Height: 6' (182.9 cm) Weight: 215 lb 6.2 oz (97.7 kg) IBW/kg (Calculated) : 77.6 Heparin Dosing Weight:   Vital Signs: Temp: 98.8 F (37.1 C) (01/02 1200) Temp Source: Oral (01/02 1200) BP: 98/73 (01/02 1200) Pulse Rate: 68 (01/02 1200)  Labs: Recent Labs    11/25/19 1322 11/26/19 0301 11/26/19 1735 11/26/19 1910 11/27/19 0229 11/28/19 0207  HGB 15.1 13.8  --   --  13.5 13.1  HCT 46.0 43.1  --   --  41.2 41.9  PLT 237 251  --   --  279 301  CREATININE 1.04  --   --   --  0.93  --   TROPONINIHS  --   --  8 10  --   --     Estimated Creatinine Clearance: 97.2 mL/min (by C-G formula based on SCr of 0.93 mg/dL).  Assessment: Patient's a 64 y.o M presented to the ED on 12/30 with c/o generalized body aches and diarrhea.  COVID test came back positive.  He went into afib with RVR on 12/31.  Pharmacy is consulted to dose Eliquis on 1/2 for afib/stroke prevention.    Plan:  - eliquis 5 mg bid (for age <80, weight >60 kg and scr <1.5) - monitor for s/s bleeding  Fredric Slabach P 11/28/2019,12:38 PM

## 2019-11-28 NOTE — Discharge Instructions (Signed)

## 2019-11-28 NOTE — Evaluation (Signed)
Physical Therapy Evaluation-1x Patient Details Name: Christopher Sullivan MRN: [id number] DOB: Apr 28, 1956 Today's Date: 11/28/2019   History of Present Illness  64 yo male admitted with COVID Pna, A fib with RVR. Hx of DM  Clinical Impression  On eval, pt was Supv-Mod Ind with mobility. He walked around the room ~50 feet while maneuvering with the IV pole. Pt denies need for PT services. He reports he has been walking to and from the bathroom without assistance. Do not anticipate any f/u PT needs. 1x eval. Will sign off.     Follow Up Recommendations No PT follow up    Equipment Recommendations  None recommended by PT    Recommendations for Other Services       Precautions / Restrictions Precautions Precautions: Fall Restrictions Weight Bearing Restrictions: No      Mobility  Bed Mobility Overal bed mobility: Modified Independent                Transfers Overall transfer level: Modified independent                  Ambulation/Gait Ambulation/Gait assistance: Supervision (Device/Increase time) Gait Distance (Feet): 50 Feet Assistive device: IV Pole;None Gait Pattern/deviations: Step-through pattern     General Gait Details: No LOB while ambulating around room and maneuvering with IV pole. Pt denied lightheadedness and dyspnea  Stairs            Wheelchair Mobility    Modified Rankin (Stroke Patients Only)       Balance Overall balance assessment: Mild deficits observed, not formally tested                                           Pertinent Vitals/Pain Pain Assessment: No/denies pain    Home Living Family/patient expects to be discharged to:: Private residence Living Arrangements: Spouse/significant other Available Help at Discharge: Family Type of Home: House Home Access: Stairs to enter     Home Layout: One level Home Equipment: None      Prior Function Level of Independence: Independent               Hand  Dominance        Extremity/Trunk Assessment   Upper Extremity Assessment Upper Extremity Assessment: Overall WFL for tasks assessed    Lower Extremity Assessment Lower Extremity Assessment: Generalized weakness    Cervical / Trunk Assessment Cervical / Trunk Assessment: Normal  Communication   Communication: No difficulties  Cognition Arousal/Alertness: Awake/alert Behavior During Therapy: WFL for tasks assessed/performed Overall Cognitive Status: Within Functional Limits for tasks assessed                                        General Comments      Exercises     Assessment/Plan    PT Assessment Patent does not need any further PT services  PT Problem List         PT Treatment Interventions      PT Goals (Current goals can be found in the Care Plan section)  Acute Rehab PT Goals Patient Stated Goal: home soon PT Goal Formulation: With patient Time For Goal Achievement: 12/12/19 Potential to Achieve Goals: Good    Frequency     Barriers to discharge  Co-evaluation               AM-PAC PT "6 Clicks" Mobility  Outcome Measure Help needed turning from your back to your side while in a flat bed without using bedrails?: None Help needed moving from lying on your back to sitting on the side of a flat bed without using bedrails?: None Help needed moving to and from a bed to a chair (including a wheelchair)?: None Help needed standing up from a chair using your arms (e.g., wheelchair or bedside chair)?: None Help needed to walk in hospital room?: A Little Help needed climbing 3-5 steps with a railing? : A Little 6 Click Score: 22    End of Session   Activity Tolerance: Patient tolerated treatment well Patient left: in bed;with call bell/phone within reach        Time: 1353-1402 PT Time Calculation (min) (ACUTE ONLY): 9 min   Charges:   PT Evaluation $PT Eval Low Complexity: 1 Low            Jenaveve Fenstermaker P, PT Acute  Rehabilitation

## 2019-11-28 NOTE — Progress Notes (Signed)
  Echocardiogram 2D Echocardiogram has been performed.  Burnett Kanaris 11/28/2019, 10:33 AM

## 2019-11-29 LAB — MAGNESIUM: Magnesium: 2 mg/dL (ref 1.7–2.4)

## 2019-11-29 LAB — CBC WITH DIFFERENTIAL/PLATELET
Abs Immature Granulocytes: 0.14 10*3/uL — ABNORMAL HIGH (ref 0.00–0.07)
Basophils Absolute: 0 10*3/uL (ref 0.0–0.1)
Basophils Relative: 0 %
Eosinophils Absolute: 0 10*3/uL (ref 0.0–0.5)
Eosinophils Relative: 0 %
HCT: 41.6 % (ref 39.0–52.0)
Hemoglobin: 13.4 g/dL (ref 13.0–17.0)
Immature Granulocytes: 2 %
Lymphocytes Relative: 10 %
Lymphs Abs: 0.8 10*3/uL (ref 0.7–4.0)
MCH: 28.2 pg (ref 26.0–34.0)
MCHC: 32.2 g/dL (ref 30.0–36.0)
MCV: 87.6 fL (ref 80.0–100.0)
Monocytes Absolute: 0.6 10*3/uL (ref 0.1–1.0)
Monocytes Relative: 7 %
Neutro Abs: 6.9 10*3/uL (ref 1.7–7.7)
Neutrophils Relative %: 81 %
Platelets: 348 10*3/uL (ref 150–400)
RBC: 4.75 MIL/uL (ref 4.22–5.81)
RDW: 12.5 % (ref 11.5–15.5)
WBC: 8.5 10*3/uL (ref 4.0–10.5)
nRBC: 0 % (ref 0.0–0.2)

## 2019-11-29 LAB — FERRITIN: Ferritin: 494 ng/mL — ABNORMAL HIGH (ref 24–336)

## 2019-11-29 LAB — C-REACTIVE PROTEIN: CRP: 1.6 mg/dL — ABNORMAL HIGH (ref <1.0)

## 2019-11-29 LAB — D-DIMER, QUANTITATIVE: D-Dimer, Quant: 1.31 ug/mL-FEU — ABNORMAL HIGH (ref 0.00–0.50)

## 2019-11-29 LAB — BASIC METABOLIC PANEL
Anion gap: 7 (ref 5–15)
BUN: 24 mg/dL — ABNORMAL HIGH (ref 8–23)
CO2: 23 mmol/L (ref 22–32)
Calcium: 8.1 mg/dL — ABNORMAL LOW (ref 8.9–10.3)
Chloride: 106 mmol/L (ref 98–111)
Creatinine, Ser: 0.93 mg/dL (ref 0.61–1.24)
GFR calc Af Amer: >60 mL/min (ref >60)
GFR calc non Af Amer: >60 mL/min (ref >60)
Glucose, Bld: 314 mg/dL — ABNORMAL HIGH (ref 70–99)
Potassium: 4 mmol/L (ref 3.5–5.1)
Sodium: 136 mmol/L (ref 135–145)

## 2019-11-29 LAB — GLUCOSE, CAPILLARY
Glucose-Capillary: 284 mg/dL — ABNORMAL HIGH (ref 70–99)
Glucose-Capillary: 317 mg/dL — ABNORMAL HIGH (ref 70–99)

## 2019-11-29 MED ORDER — ASCORBIC ACID 500 MG PO TABS: 500.0000 mg | ORAL_TABLET | Freq: Every day | ORAL | 0 refills | Status: DC

## 2019-11-29 MED ORDER — DEXAMETHASONE 6 MG PO TABS: 6.0000 mg | ORAL_TABLET | Freq: Every day | ORAL | 0 refills | Status: AC

## 2019-11-29 MED ORDER — ATORVASTATIN CALCIUM 20 MG PO TABS: 20.0000 mg | ORAL_TABLET | Freq: Every day | ORAL | 0 refills | Status: DC

## 2019-11-29 MED ORDER — DILTIAZEM HCL ER COATED BEADS 120 MG PO CP24: 120.0000 mg | ORAL_CAPSULE | Freq: Every day | ORAL | 0 refills | Status: DC

## 2019-11-29 MED ORDER — ZINC SULFATE 220 (50 ZN) MG PO CAPS: 220.0000 mg | ORAL_CAPSULE | Freq: Every day | ORAL | 0 refills | Status: DC

## 2019-11-29 MED ORDER — APIXABAN 5 MG PO TABS: 5.0000 mg | ORAL_TABLET | Freq: Two times a day (BID) | ORAL | 0 refills | Status: DC

## 2019-11-29 NOTE — Progress Notes (Signed)
AVS given to patient and explained at the bedside. Medications and follow up appointments have been explained with pt verbalizing understanding.  

## 2019-11-29 NOTE — Progress Notes (Signed)
Inpatient Diabetes Program Recommendations  AACE/ADA: New Consensus Statement on Inpatient Glycemic Control (2015)  Target Ranges:  Prepandial:   less than 140 mg/dL      Peak postprandial:   less than 180 mg/dL (1-2 hours)      Critically ill patients:  140 - 180 mg/dL   Lab Results  Component Value Date   GLUCAP 317 (H) 11/29/2019   HGBA1C 9.2 (H) 11/25/2019    Review of Glycemic Control Results for Christopher Sullivan, Christopher Sullivan (MRN [id number]) as of 11/29/2019 14:43  Ref. Range 11/28/2019 11:56 11/28/2019 16:53 11/28/2019 21:17 11/29/2019 07:44 11/29/2019 11:23  Glucose-Capillary Latest Ref Range: 70 - 99 mg/dL 303 (H) 224 (H) 280 (H) 284 (H) 317 (H)    Inpatient Diabetes Program Recommendations:   Noted CBGs continue to be elevated on steroids. While on current steroids,  -Increase Novolog meal coverage to 8 units tid meal coverage if eats 50% meals -Increase Novolog correction to resistant tid + hs 0-5 units  Thank you, Christopher Roys E. Kamaron Deskins, RN, MSN, CDE  Diabetes Coordinator Inpatient Glycemic Control Team Team Pager [phone] (8am-5pm) 11/29/2019 2:45 PM

## 2019-11-30 DIAGNOSIS — I4891 Unspecified atrial fibrillation: Secondary | ICD-10-CM | POA: Diagnosis not present

## 2019-11-30 DIAGNOSIS — R300 Dysuria: Secondary | ICD-10-CM | POA: Diagnosis not present

## 2019-11-30 DIAGNOSIS — R310 Gross hematuria: Secondary | ICD-10-CM | POA: Diagnosis not present

## 2019-11-30 DIAGNOSIS — U071 COVID-19: Secondary | ICD-10-CM | POA: Diagnosis not present

## 2019-11-30 LAB — CULTURE, BLOOD (ROUTINE X 2)
Culture: NO GROWTH
Culture: NO GROWTH
Special Requests: ADEQUATE
Special Requests: ADEQUATE

## 2019-12-01 NOTE — Discharge Summary (Signed)
[facility] Discharge Summary   Patient: Christopher Sullivan [id number]   PCP: [name], MD DOB: Feb 02, 1956   Date of admission: 11/25/2019   Date of discharge: 11/29/2019     Discharge Diagnoses:  Principal Problem:   Pneumonia due to COVID-19 virus Active Problems:   Hyperlipidemia associated with type 2 diabetes mellitus ([place])   Essential hypertension   Failure to thrive in adult   Lab test positive for detection of COVID-19 virus   Hypokalemia   Hyperbilirubinemia   Hypoalbuminemia   Elevated d-dimer   Uncontrolled type 2 diabetes mellitus ([place])   Elevated ferritin   Hyponatremia   AF (paroxysmal atrial fibrillation) ([place])  Admitted From: home Disposition:  Home   Recommendations for Outpatient Follow-up:  1. PCP: Follow-up in 1 to 2 weeks 2. Follow up LABS/TEST: None  Follow-up Information    [name], MD. Schedule an appointment as soon as possible for a visit in 1 week(s).   Specialty: Family Medicine Contact information: [place] [id number] [phone]          Diet recommendation: Cardiac diet  Activity: The patient is advised to gradually reintroduce usual activities,as tolerated  Discharge Condition: good  Code Status: Full code   History of present illness: As per the H and P dictated on admission, "This is a 64 year old male with a positive COVID-19 contact (wife) who presented on 12/30 with 1 week diarrhea and generalized body aches and decreased appetite/decreased p.o. intake.  Also with cough.  His symptoms worsened over the past 2 days.  He was recently seen at [facility] on 12/25 for decreased appetite and vomiting, chills and cough with negative rapid Covid test at that time.  Improved with IV fluids and antinausea medicine and discharged home with Covid instructions.  He had repeat Covid testing on 12/28 which also was negative.  Currently, patient states that he has "only been sick 2 times in his life and this is  the worst it ever felt." Also admits to bilateral lower extremity calf pain which is new.  Had an episode of urinary incontinence this a.m. has chronic low back pain which has not worsened.  No other neurologic deficit such as saddle anesthesia or lower extremity weakness."  Hospital Course:  Summary of his active problems in the hospital is as following.  Pneumonia due to COVID-19 virus/lab test positive for detection of COVID-19 virus with associated elevated inflammatory markers including D-dimer, ferritin, and CRP X-ray personally reviewed 11/26/2019 and showed bilateral hazy airspace opacities, worse on the left.   Admitted and placed on Decadron and remdesivir.   Supportive care provided with zinc, vitamin C, antipyretics, antitussives as needed.   Had an isolated low oxygen saturation of 89% overnight, but currently 94% on room air.   Inflammatory markers improving, CRP 9.5---> 4.0, ferritin 726---> 518, D-dimer 1.41---> 1.25.  Right lower extremity Dopplers negative for DVT.  Active Problems:   New onset paroxysmal afib with RVR Called by RN re: tele showing afib w/ RVR 11/26/2019.  Triggering event was an upsetting phone call with his family member but the patient reported being asymptomatic.  Twelve-lead EKG confirmed atrial fibrillation with RVR.  Troponin negative x 2.  Was given a 10 mg Cardizem bolus and placed on a Cardizem drip at 15 mg/h to achieve rate control.  Transitioned to oral Cardizem 11/27/2019.  After discussing with the patient, the patient was started on Eliquis for systemic anticoagulation 11/27/2019.  2D echocardiogram  shows preserved  EF. Heart rate 40s-50s overnight.   EKG rechecked and personally reviewed, showed ongoing atrial fibrillation with controlled rate.  Cardizem dosage reduced with parameters to hold.  Remains on home dose of metoprolol as well.    Hyperlipidemia associated with type 2 diabetes mellitus (HCC)/uncontrolled diabetes Hemoglobin A1c 9.0  indicative of very poor outpatient control. Continue home regimen at discharge.    Essential hypertension Continue metoprolol.    Failure to thrive in adult Secondary to Covid pneumonia.  PT consultation requested.    Hypokalemia Normalized with supplementation.    Hyperbilirubinemia Mildly elevated bilirubin likely reflective of hemoconcentration.  Normalized with rehydration.    Hypoalbuminemia Likely indicative of malnourishment.  Patient was seen by physical therapy, who recommended no therapy needed on discharge. On the day of the discharge the patient's vitals were stable, and no other acute medical condition were reported by patient. the patient was felt safe to be discharge at Home with no therapy needed on discharge.  Consultants: none Procedures: none  DISCHARGE MEDICATION: Allergies as of 11/29/2019      Reactions   Lisinopril Swelling      Medication List    STOP taking these medications   aspirin EC 81 MG tablet   hydrochlorothiazide 25 MG tablet Commonly known as: HYDRODIURIL   simvastatin 40 MG tablet Commonly known as: ZOCOR     TAKE these medications   apixaban 5 MG Tabs tablet Commonly known as: ELIQUIS Take 1 tablet (5 mg total) by mouth 2 (two) times daily.   ascorbic acid 500 MG tablet Commonly known as: VITAMIN C Take 1 tablet (500 mg total) by mouth daily.   atorvastatin 20 MG tablet Commonly known as: LIPITOR Take 1 tablet (20 mg total) by mouth daily.   dexamethasone 6 MG tablet Commonly known as: Decadron Take 1 tablet (6 mg total) by mouth daily for 5 days.   diltiazem 120 MG 24 hr capsule Commonly known as: Cardizem CD Take 1 capsule (120 mg total) by mouth daily.   glipiZIDE 10 MG 24 hr tablet Commonly known as: Glucotrol XL Take 1 tablet (10 mg total) by mouth daily with breakfast.   metFORMIN 1000 MG tablet Commonly known as: GLUCOPHAGE Take 1,000 mg by mouth 2 (two) times daily with a meal.   metoprolol  succinate 25 MG 24 hr tablet Commonly known as: TOPROL-XL Take 1 tablet (25 mg total) by mouth daily.   zinc sulfate 220 (50 Zn) MG capsule Take 1 capsule (220 mg total) by mouth daily.      Allergies  Allergen Reactions  . Lisinopril Swelling   Discharge Instructions    MyChart COVID-19 home monitoring program   Complete by: Nov 29, 2019    Is the patient willing to use the [place] for home monitoring?: No   Diet - low sodium heart healthy   Complete by: As directed    Increase activity slowly   Complete by: As directed      Discharge Exam: Filed Weights   11/26/19 0156  Weight: 97.7 kg   Vitals:   11/29/19 1007 11/29/19 1126  BP: 100/75 (!) 91/55  Pulse: 73 84  Resp:  16  Temp:  98.8 F (37.1 C)  SpO2: 94% 94%   General: Appear in no distress, no Rash; Oral Mucosa Clear, moist. no Abnormal Mass Or lumps Cardiovascular: S1 and S2 Present, no Murmur, Respiratory: normal respiratory effort, Bilateral Air entry present and Clear to Auscultation, no Crackles, no wheezes Abdomen: Bowel Sound  present, Soft and no tenderness, no hernia Extremities: no Pedal edema, no calf tenderness Neurology: alert and oriented to time, place, and person affect appropriate.  The results of significant diagnostics from this hospitalization (including imaging, microbiology, ancillary and laboratory) are listed below for reference.    Significant Diagnostic Studies: DG Chest Port 1 View  Result Date: 11/25/2019 CLINICAL DATA:  COVID-19 positive, cough EXAM: PORTABLE CHEST 1 VIEW COMPARISON:  11/20/2019 FINDINGS: The heart size and mediastinal contours are stable. New hazy airspace opacities within the peripheral aspects of the bilateral lung fields, most pronounced within the mid to lower lungs. No pleural effusion or pneumothorax. IMPRESSION: New hazy airspace opacities within the peripheral aspects of the bilateral lungs suspicious for multifocal viral pneumonia. Electronically  Signed   By: Davina Poke D.O.   On: 11/25/2019 12:54   DG Chest Port 1 View  Result Date: 11/20/2019 CLINICAL DATA:  Decreased appetite with fever, chills, cough and weakness for 2 days. History COVID-19 exposure, tested yesterday with unreported results. EXAM: PORTABLE CHEST 1 VIEW COMPARISON:  Radiographs 12/27/2018. FINDINGS: 1730 hours. Significant lordotic positioning, limiting comparison with previous study. Allowing for this, the heart size and mediastinal contours are stable. Probable mild atelectasis or scarring at both lung bases. No confluent or ground-glass opacities, edema, pleural effusion or pneumothorax. No acute osseous findings. IMPRESSION: No active cardiopulmonary process. Probable mild bibasilar atelectasis or scarring. Electronically Signed   By: Richardean Sale M.D.   On: 11/20/2019 17:44   ECHOCARDIOGRAM COMPLETE  Result Date: 11/28/2019   ECHOCARDIOGRAM REPORT   Patient Name:   Ollis Hennick  Date of Exam: 11/28/2019 Medical Rec #:  [id number]  Height:       72.0 in Accession #:    [id number] Weight:       215.4 lb Date of Birth:  [birth year] BSA:          2.20 m Patient Age:    36 years   BP:           104/63 mmHg Patient Gender: M          HR:           88 bpm. Exam Location:  Inpatient Procedure: 2D Echo, Cardiac Doppler and Color Doppler Indications:    Atrial Fibrillation 427.31  History:        Patient has no prior history of Echocardiogram examinations.                 Arrythmias:Atrial Fibrillation; Risk Factors:Hypertension,                 Diabetes, Dyslipidemia and Non-Smoker.  Sonographer:    Paulita Fujita RDCS Referring Phys: [id number] CHRISTINA P RAMA  Sonographer Comments: Covid positive. IMPRESSIONS  1. Left ventricular ejection fraction, by visual estimation, is 55 to 60%. The left ventricle has normal function. Left ventricular septal wall thickness was normal. Normal left ventricular posterior wall thickness. There is no left ventricular hypertrophy.  2. Left  ventricular diastolic function could not be evaluated.  3. Mildly dilated left ventricular internal cavity size.  4. The left ventricle has no regional wall motion abnormalities.  5. Mild hypokinesis of the basal inferior myocardium.  6. Global right ventricle has normal systolic function.The right ventricular size is normal. No increase in right ventricular wall thickness.  7. Left atrial size was mildly dilated.  8. Right atrial size was normal.  9. Mild mitral annular calcification. 10. The mitral valve is normal in structure. Trivial mitral valve  regurgitation. No evidence of mitral stenosis. 11. The tricuspid valve is normal in structure. 12. The aortic valve is tricuspid. Aortic valve regurgitation is not visualized. No evidence of aortic valve sclerosis or stenosis. 13. The pulmonic valve was normal in structure. Pulmonic valve regurgitation is not visualized. 14. Normal pulmonary artery systolic pressure. 15. The inferior vena cava is normal in size with greater than 50% respiratory variability, suggesting right atrial pressure of 3 mmHg. FINDINGS  Left Ventricle: Left ventricular ejection fraction, by visual estimation, is 55 to 60%. The left ventricle has normal function. The left ventricle has no regional wall motion abnormalities. The left ventricular internal cavity size was mildly dilated left ventricle. Normal left ventricular posterior wall thickness. There is no left ventricular hypertrophy. The left ventricular diastology could not be evaluated due to atrial fibrillation. Left ventricular diastolic function could not be evaluated. Normal left atrial pressure. Mild hypokinesis of the basal inferior myocardium. Right Ventricle: The right ventricular size is normal. No increase in right ventricular wall thickness. Global RV systolic function is has normal systolic function. The tricuspid regurgitant velocity is 2.63 m/s, and with an assumed right atrial pressure  of 3 mmHg, the estimated right  ventricular systolic pressure is normal at 30.7 mmHg. Left Atrium: Left atrial size was mildly dilated. Right Atrium: Right atrial size was normal in size Pericardium: There is no evidence of pericardial effusion. Mitral Valve: The mitral valve is normal in structure. Mild mitral annular calcification. Trivial mitral valve regurgitation. No evidence of mitral valve stenosis by observation. Tricuspid Valve: The tricuspid valve is normal in structure. Tricuspid valve regurgitation is trivial. Aortic Valve: The aortic valve is tricuspid. Aortic valve regurgitation is not visualized. The aortic valve is structurally normal, with no evidence of sclerosis or stenosis. Pulmonic Valve: The pulmonic valve was normal in structure. Pulmonic valve regurgitation is not visualized. Pulmonic regurgitation is not visualized. Aorta: The aortic root, ascending aorta and aortic arch are all structurally normal, with no evidence of dilitation or obstruction. Venous: The inferior vena cava is normal in size with greater than 50% respiratory variability, suggesting right atrial pressure of 3 mmHg. IAS/Shunts: No atrial level shunt detected by color flow Doppler. There is no evidence of a patent foramen ovale. No ventricular septal defect is seen or detected. There is no evidence of an atrial septal defect.  LEFT VENTRICLE PLAX 2D LVIDd:         5.60 cm LVIDs:         3.80 cm LV PW:         0.90 cm LV IVS:        0.90 cm LVOT diam:     2.30 cm LV SV:         92 ml LV SV Index:   40.79 LVOT Area:     4.15 cm  LV Volumes (MOD) LV area d, A2C:    42.20 cm LV area d, A4C:    46.30 cm LV area s, A2C:    27.30 cm LV area s, A4C:    28.70 cm LV major d, A2C:   9.03 cm LV major d, A4C:   9.06 cm LV major s, A2C:   7.15 cm LV major s, A4C:   7.26 cm LV vol d, MOD A2C: 161.0 ml LV vol d, MOD A4C: 186.0 ml LV vol s, MOD A2C: 85.0 ml LV vol s, MOD A4C: 98.0 ml LV SV MOD A2C:     76.0 ml LV SV MOD A4C:  186.0 ml LV SV MOD BP:      81.4 ml LEFT  ATRIUM             Index       RIGHT ATRIUM           Index LA diam:        4.40 cm 2.00 cm/m  RA Area:     15.60 cm LA Vol (A2C):   49.6 ml 22.56 ml/m RA Volume:   39.40 ml  17.92 ml/m LA Vol (A4C):   72.8 ml 33.11 ml/m LA Biplane Vol: 66.6 ml 30.29 ml/m  AORTIC VALVE LVOT Vmax:   71.80 cm/s LVOT Vmean:  55.900 cm/s LVOT VTI:    0.130 m  AORTA Ao Root diam: 3.10 cm TRICUSPID VALVE TR Peak grad:   27.7 mmHg TR Vmax:        263.00 cm/s  SHUNTS Systemic VTI:  0.13 m Systemic Diam: 2.30 cm  Skeet Latch MD Electronically signed by Skeet Latch MD Signature Date/Time: 11/28/2019/1:58:13 PM    Final    VAS [place] LOWER EXTREMITY VENOUS (DVT)  Result Date: 11/26/2019  Lower Venous Study Indications: Pain.  Risk Factors: COVID 19 positive. Comparison Study: No prior studies. Performing Technologist: Oliver Hum RVT  Examination Guidelines: A complete evaluation includes B-mode imaging, spectral Doppler, color Doppler, and power Doppler as needed of all accessible portions of each vessel. Bilateral testing is considered an integral part of a complete examination. Limited examinations for reoccurring indications may be performed as noted.  +---------+---------------+---------+-----------+----------+--------------+ RIGHT    CompressibilityPhasicitySpontaneityPropertiesThrombus Aging +---------+---------------+---------+-----------+----------+--------------+ CFV      Full           Yes      Yes                                 +---------+---------------+---------+-----------+----------+--------------+ SFJ      Full                                                        +---------+---------------+---------+-----------+----------+--------------+ FV Prox  Full                                                        +---------+---------------+---------+-----------+----------+--------------+ FV Mid   Full                                                         +---------+---------------+---------+-----------+----------+--------------+ FV DistalFull                                                        +---------+---------------+---------+-----------+----------+--------------+ PFV      Full                                                        +---------+---------------+---------+-----------+----------+--------------+  POP      Full           Yes      Yes                                 +---------+---------------+---------+-----------+----------+--------------+ PTV      Full                                                        +---------+---------------+---------+-----------+----------+--------------+ PERO     Full                                                        +---------+---------------+---------+-----------+----------+--------------+   +---------+---------------+---------+-----------+----------+--------------+ LEFT     CompressibilityPhasicitySpontaneityPropertiesThrombus Aging +---------+---------------+---------+-----------+----------+--------------+ CFV      Full           Yes      Yes                                 +---------+---------------+---------+-----------+----------+--------------+ SFJ      Full                                                        +---------+---------------+---------+-----------+----------+--------------+ FV Prox  Full                                                        +---------+---------------+---------+-----------+----------+--------------+ FV Mid   Full                                                        +---------+---------------+---------+-----------+----------+--------------+ FV DistalFull                                                        +---------+---------------+---------+-----------+----------+--------------+ PFV      Full                                                         +---------+---------------+---------+-----------+----------+--------------+ POP      Full           Yes      Yes                                 +---------+---------------+---------+-----------+----------+--------------+  PTV      Full                                                        +---------+---------------+---------+-----------+----------+--------------+ PERO     Full                                                        +---------+---------------+---------+-----------+----------+--------------+     Summary: Right: There is no evidence of deep vein thrombosis in the lower extremity. No cystic structure found in the popliteal fossa. Left: There is no evidence of deep vein thrombosis in the lower extremity. No cystic structure found in the popliteal fossa.  *See table(s) above for measurements and observations. Electronically signed by Deitra Mayo MD on 11/26/2019 at 1:29:23 PM.    Final     Microbiology: Recent Results (from the past 240 hour(s))  Novel Coronavirus, NAA ([facility])     Status: None   Collection Time: 11/23/19  3:32 PM   Specimen: Nasopharyngeal(NP) swabs in vial transport medium   NASOPHARYNGE  TESTING  Result Value Ref Range Status   SARS-CoV-2, NAA Not Detected Not Detected Final    Comment: Testing was performed using the cobas(R) SARS-CoV-2 test. This nucleic acid amplification test was developed and its performance characteristics determined by [employer]. Nucleic acid amplification tests include PCR and TMA. This test has not been FDA cleared or approved. This test has been authorized by FDA under an Emergency Use Authorization (EUA). This test is only authorized for the duration of time the declaration that circumstances exist justifying the authorization of the emergency use of in vitro diagnostic tests for detection of SARS-CoV-2 virus and/or diagnosis of COVID-19 infection under section 564(b)(1) of the Act, 21  U.S.C. [id number]) (1), unless the authorization is terminated or revoked sooner. When diagnostic testing is negative, the possibility of a false negative result should be considered in the context of a patient's recent exposures and the presence of clinical signs and symptoms consistent with COVID-19. An individual without symptoms  of COVID-19 and who is not shedding SARS-CoV-2 virus would expect to have a negative (not detected) result in this assay.   Blood Culture (routine x 2)     Status: None   Collection Time: 11/25/19 12:29 PM   Specimen: Right Antecubital; Blood  Result Value Ref Range Status   Specimen Description   Final    RIGHT ANTECUBITAL Performed at [facility] [address], [place], [place] [address]    Special Requests   Final    BOTTLES DRAWN AEROBIC AND ANAEROBIC Blood Culture adequate volume Performed at [facility] [address], [place], [place] [address]    Culture   Final    NO GROWTH 5 DAYS Performed at [facility], [place] [address] [place], [place] [address]    Report Status 11/30/2019 FINAL  Final  SARS CORONAVIRUS 2 (TAT 6-24 HRS) Nasopharyngeal Nasopharyngeal Swab     Status: Abnormal   Collection Time: 11/25/19  1:22 PM   Specimen: Nasopharyngeal Swab  Result Value Ref Range Status   SARS Coronavirus 2 POSITIVE (A) NEGATIVE Final  Comment: RESULT CALLED TO, READ BACK BY AND VERIFIED WITH: J.OXENDIEN RN 2041 11/25/2019 MCCORMICK K (NOTE) SARS-CoV-2 target nucleic acids are DETECTED. The SARS-CoV-2 RNA is generally detectable in upper and lower respiratory specimens during the acute phase of infection. Positive results are indicative of the presence of SARS-CoV-2 RNA. Clinical correlation with patient history and other diagnostic information is  necessary to determine patient infection status. Positive results do not rule out bacterial infection or co-infection with other viruses.  The  expected result is Negative. Fact Sheet for Patients: SugarRoll.be Fact Sheet for Healthcare Providers: https://www.woods-mathews.com/ This test is not yet approved or cleared by the [place] FDA and  has been authorized for detection and/or diagnosis of SARS-CoV-2 by FDA under an Emergency Use Authorization (EUA). This EUA will remain  in effect (meaning this test can be used) f or the duration of the COVID-19 declaration under Section 564(b)(1) of the Act, 21 U.S.C. section 360bbb-3(b)(1), unless the authorization is terminated or revoked sooner. Performed at [facility], [place] [address], [place], [place] [address]   Blood Culture (routine x 2)     Status: None   Collection Time: 11/25/19  1:22 PM   Specimen: BLOOD  Result Value Ref Range Status   Specimen Description   Final    BLOOD LEFT ANTECUBITAL Performed at [facility] [address], [place], [place] [address]    Special Requests   Final    BOTTLES DRAWN AEROBIC AND ANAEROBIC Blood Culture adequate volume Performed at [facility] [address], [place], [place] [address]    Culture   Final    NO GROWTH 5 DAYS Performed at [facility], [place] [address] [place],  [address]    Report Status 11/30/2019 FINAL  Final     Labs: CBC: Recent Labs  Lab 11/25/19 1322 11/26/19 0301 11/27/19 0229 11/28/19 0207 11/29/19 0347  WBC 8.1 6.4 10.2 7.0 8.5  NEUTROABS 6.6 5.5 8.7* 6.0 6.9  HGB 15.1 13.8 13.5 13.1 13.4  HCT 46.0 43.1 41.2 41.9 41.6  MCV 88.0 89.2 87.7 90.1 87.6  PLT 237 251 279 301 0000000   Basic Metabolic Panel: Recent Labs  Lab 11/25/19 1322 11/26/19 0301 11/27/19 0229 11/28/19 0207 11/29/19 0347  NA 135  --  136  --  136  K 3.4*  --  3.9  --  4.0  CL 95*  --  105  --  106  CO2 25  --  22  --  23  GLUCOSE 162*  --  224*  --  314*  BUN 14  --  24*  --  24*  CREATININE 1.04  --  0.93  --  0.93   CALCIUM 9.2  --  8.2*  --  8.1*  MG  --  2.1 2.2 2.0 2.0   Liver Function Tests: Recent Labs  Lab 11/25/19 1322 11/27/19 0229  AST 22 25  ALT 23 24  ALKPHOS 69 50  BILITOT 1.3* 0.8  PROT 8.3* 6.3*  ALBUMIN 2.9* 2.4*   No results for input(s): LIPASE, AMYLASE in the last 168 hours. No results for input(s): AMMONIA in the last 168 hours. Cardiac Enzymes: No results for input(s): CKTOTAL, CKMB, CKMBINDEX, TROPONINI in the last 168 hours. BNP (last 3 results) No results for input(s): BNP in the last 8760 hours. CBG: Recent Labs  Lab 11/28/19 1156 11/28/19 1653 11/28/19 2117 11/29/19 0744 11/29/19 1123  GLUCAP 303* 224* 280* 284* 317*  Time spent: 35 minutes  Signed:  Berle Mull  [facility] 11/29/2019 8:11 PM

## 2019-12-10 DIAGNOSIS — H259 Unspecified age-related cataract: Secondary | ICD-10-CM | POA: Diagnosis not present

## 2019-12-11 DIAGNOSIS — H2512 Age-related nuclear cataract, left eye: Secondary | ICD-10-CM | POA: Diagnosis not present

## 2019-12-11 DIAGNOSIS — Z961 Presence of intraocular lens: Secondary | ICD-10-CM | POA: Diagnosis not present

## 2019-12-11 DIAGNOSIS — H2000 Unspecified acute and subacute iridocyclitis: Secondary | ICD-10-CM | POA: Diagnosis not present

## 2019-12-15 DIAGNOSIS — U071 COVID-19: Secondary | ICD-10-CM | POA: Diagnosis not present

## 2019-12-15 DIAGNOSIS — I4891 Unspecified atrial fibrillation: Secondary | ICD-10-CM | POA: Diagnosis not present

## 2019-12-15 DIAGNOSIS — E441 Mild protein-calorie malnutrition: Secondary | ICD-10-CM | POA: Diagnosis not present

## 2019-12-15 DIAGNOSIS — J1282 Pneumonia due to coronavirus disease 2019: Secondary | ICD-10-CM | POA: Diagnosis not present

## 2019-12-16 ENCOUNTER — Telehealth: Payer: BC Managed Care – PPO | Admitting: Cardiology

## 2019-12-16 ENCOUNTER — Encounter: Payer: Self-pay | Admitting: Cardiology

## 2019-12-16 NOTE — Progress Notes (Unsigned)
{Choose 1 Note Type (Telehealth Visit or Telephone Visit):[phone]}   Date:  12/16/2019   ID:  Christopher Sullivan, DOB August 27, 1956, MRN [id number]  {Patient Location:[id number]::"Home"} {Provider Location:[id number]::"Home"}  PCP:  [name], MD  Cardiologist:  No primary care provider on file. *** Electrophysiologist:  None   Evaluation Performed:  {Choose Visit Type:[phone]::"Follow-Up Visit"}  Chief Complaint:  ***  History of Present Illness:    Christopher Sullivan is a 64 y.o. male with ***  1. Irregular heart beat/PVCs - noted recently by pcp to have frequent PVCs on EKG - started on Toprol XL 25mg  by pcp at that time - recent K was 3.7, TSH 2.79  - denies any palpitations - aching pain left chest can last a few minutes. Can occur at rest or with exertion. Total of 2-3 episodes, none recently - no edema. No SOB/DOE. 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  - limited caffeine. No EtoH.    09/2017 monitor 3% PVC burden Jan 2021 echo LVEF 55-60%,   2. COVID pneumonia - admission later Dec early Jan with COVID -   3. Afib with RVR - new diagnosis during recent admission with COVID - started on diltiazem, eliquis   The patient {does/does not:200015} have symptoms concerning for COVID-19 infection (fever, chills, cough, or new shortness of breath).    Past Medical History:  Diagnosis Date  . Bilateral primary osteoarthritis of knee   . Diabetes mellitus without complication ([place])   . GERD (gastroesophageal reflux disease)   . Hyperlipidemia   . Hypertension    Past Surgical History:  Procedure Laterality Date  . None       No outpatient medications have been marked as taking for the 12/16/19 encounter (Appointment) with Arnoldo Lenis, MD.     Allergies:   Lisinopril   Social History   Tobacco Use  . Smoking status: Never Smoker  . Smokeless tobacco: Never Used  Substance Use Topics  . Alcohol use: No  . Drug use: No      Family Hx: The patient's family history includes Arthritis in his mother; Depression in his father; Diabetes in his daughter and mother; Heart disease in his mother; Hypertension in his daughter and mother; Lupus in his sister; Mental illness in his father; Miscarriages / [place] in his mother; Stroke in his mother. There is no history of Colon cancer or Colon polyps.  ROS:   Please see the history of present illness.    *** All other systems reviewed and are negative.   Prior CV studies:   The following studies were reviewed today:  09/2017 monitor 24-hour Holter monitor reviewed. Sinus rhythm is present throughout. Heart rate ranged from 58 bpm up to 111 bpm with average heart rate 79 bpm. There were no significant pauses. Occasional PVCs were noted with rare couplets and bigeminy (approximately 3% of total beats). Rare PACs noted.     Labs/Other Tests and Data Reviewed:    EKG:  {EKG/Telemetry Strips Reviewed:[phone]}  Recent Labs: 11/27/2019: ALT 24 11/29/2019: BUN 24; Creatinine, Ser 0.93; Hemoglobin 13.4; Magnesium 2.0; Platelets 348; Potassium 4.0; Sodium 136   Recent Lipid Panel Lab Results  Component Value Date/Time   CHOL 83 11/25/2019 01:22 PM   TRIG 89 11/25/2019 01:22 PM   HDL 29 (L) 11/25/2019 01:22 PM   CHOLHDL 2.9 11/25/2019 01:22 PM   LDLCALC 36 11/25/2019 01:22 PM   LDLCALC 54 02/11/2018 12:16 PM    Wt Readings from Last 3 Encounters:  11/26/19 215 lb 6.2 oz (97.7 kg)  11/20/19 234 lb (106.1 kg)  02/12/19 243 lb (110.2 kg)     Objective:    Vital Signs:  There were no vitals taken for this visit.   {HeartCare Virtual Exam (Optional):[phone]::"VITAL SIGNS:  reviewed"}  ASSESSMENT & PLAN:    1. PVCs - we will obtain a 24 hr holter monitor to quantify his pvc burden. Pending results will consider echo to evaluate for structurual heart disease and possible stress testing as well. Futher testing pending holter results - continue beta  blocker   COVID-19 Education: The signs and symptoms of COVID-19 were discussed with the patient and how to seek care for testing (follow up with PCP or arrange E-visit).  ***The importance of social distancing was discussed today.  Time:   Today, I have spent *** minutes with the patient with telehealth technology discussing the above problems.     Medication Adjustments/Labs and Tests Ordered: Current medicines are reviewed at length with the patient today.  Concerns regarding medicines are outlined above.   Tests Ordered: No orders of the defined types were placed in this encounter.   Medication Changes: No orders of the defined types were placed in this encounter.   Follow Up:  {F/U Format:[phone]} {follow up:15908}  Signed, Carlyle Dolly, MD  12/16/2019 8:15 AM    [facility]

## 2019-12-17 DIAGNOSIS — H2000 Unspecified acute and subacute iridocyclitis: Secondary | ICD-10-CM | POA: Diagnosis not present

## 2019-12-17 DIAGNOSIS — Z961 Presence of intraocular lens: Secondary | ICD-10-CM | POA: Diagnosis not present

## 2019-12-17 DIAGNOSIS — H2512 Age-related nuclear cataract, left eye: Secondary | ICD-10-CM | POA: Diagnosis not present

## 2019-12-17 DIAGNOSIS — E119 Type 2 diabetes mellitus without complications: Secondary | ICD-10-CM | POA: Diagnosis not present

## 2019-12-23 ENCOUNTER — Telehealth (INDEPENDENT_AMBULATORY_CARE_PROVIDER_SITE_OTHER): Payer: BC Managed Care – PPO | Admitting: Cardiology

## 2019-12-23 ENCOUNTER — Encounter: Payer: Self-pay | Admitting: Cardiology

## 2019-12-23 VITALS — Ht 72.0 in | Wt 223.0 lb

## 2019-12-23 DIAGNOSIS — I4891 Unspecified atrial fibrillation: Secondary | ICD-10-CM | POA: Diagnosis not present

## 2019-12-23 NOTE — Progress Notes (Signed)
Virtual Visit via Telephone Note   This visit type was conducted due to national recommendations for restrictions regarding the COVID-19 Pandemic (e.g. social distancing) in an effort to limit this patient's exposure and mitigate transmission in our community.  Due to his co-morbid illnesses, this patient is at least at moderate risk for complications without adequate follow up.  This format is felt to be most appropriate for this patient at this time.  The patient did not have access to video technology/had technical difficulties with video requiring transitioning to audio format only (telephone).  All issues noted in this document were discussed and addressed.  No physical exam could be performed with this format.  Please refer to the patient's chart for his  consent to telehealth for [facility].   Date:  12/23/2019   ID:  Christopher Sullivan, DOB 1956/01/19, MRN [id number]  Patient Location: Home Provider Location: Office  PCP:  [name], MD  Cardiologist:  Dr Carlyle Dolly MD Electrophysiologist:  None   Evaluation Performed:  Follow-Up Visit  Chief Complaint:  Follow up  History of Present Illness:    Christopher Sullivan is a 64 y.o. male seen for follow up  1. Irregular heart beat/PVCs -  evaluated in 2018, holter with low burden - 09/2017 holter monitor: Occasional PVCs were noted with rare couplets and bigeminy (approximately 3% of total beats). Rare PACs noted. - no recent symptoms    2. COVID pneumonia - admitted Jan 2021 - breathing is improving, ongoing fatigue    3. Afib - new diagnosis during Jan 2021 admission with COVID - started on eliquis - has been on dilt 120, toprol 25 for rate control  - denies any palpitations. Compliant with meds       The patient does not have symptoms concerning for COVID-19 infection (fever, chills, cough, or new shortness of breath).    Past Medical History:  Diagnosis Date  . Bilateral primary osteoarthritis of knee   .  Diabetes mellitus without complication ([place])   . GERD (gastroesophageal reflux disease)   . Hyperlipidemia   . Hypertension    Past Surgical History:  Procedure Laterality Date  . CATARACT EXTRACTION    . None       No outpatient medications have been marked as taking for the 12/23/19 encounter (Appointment) with Arnoldo Lenis, MD.     Allergies:   Lisinopril   Social History   Tobacco Use  . Smoking status: Never Smoker  . Smokeless tobacco: Never Used  Substance Use Topics  . Alcohol use: No  . Drug use: No     Family Hx: The patient's family history includes Arthritis in his mother; Depression in his father; Diabetes in his daughter and mother; Heart disease in his mother; Hypertension in his daughter and mother; Lupus in his sister; Mental illness in his father; Miscarriages / [place] in his mother; Stroke in his mother. There is no history of Colon cancer or Colon polyps.  ROS:   Please see the history of present illness.     All other systems reviewed and are negative.   Prior CV studies:   The following studies were reviewed today:  09/2017 holter 24-hour Holter monitor reviewed. Sinus rhythm is present throughout. Heart rate ranged from 58 bpm up to 111 bpm with average heart rate 79 bpm. There were no significant pauses. Occasional PVCs were noted with rare couplets and bigeminy (approximately 3% of total beats). Rare PACs noted.  Jan 2021 echo IMPRESSIONS  1. Left ventricular ejection fraction, by visual estimation, is 55 to 60%. The left ventricle has normal function. Left ventricular septal wall thickness was normal. Normal left ventricular posterior wall thickness. There is no left ventricular  hypertrophy.  2. Left ventricular diastolic function could not be evaluated.  3. Mildly dilated left ventricular internal cavity size.  4. The left ventricle has no regional wall motion abnormalities.  5. Mild hypokinesis of the basal inferior  myocardium.  6. Global right ventricle has normal systolic function.The right ventricular size is normal. No increase in right ventricular wall thickness.  7. Left atrial size was mildly dilated.  8. Right atrial size was normal.  9. Mild mitral annular calcification. 10. The mitral valve is normal in structure. Trivial mitral valve regurgitation. No evidence of mitral stenosis. 11. The tricuspid valve is normal in structure. 12. The aortic valve is tricuspid. Aortic valve regurgitation is not visualized. No evidence of aortic valve sclerosis or stenosis. 13. The pulmonic valve was normal in structure. Pulmonic valve regurgitation is not visualized. 14. Normal pulmonary artery systolic pressure. 15. The inferior vena cava is normal in size with greater than 50% respiratory variability, suggesting right atrial pressure of 3 mmHg.  Labs/Other Tests and Data Reviewed:    EKG:  No ECG reviewed.  Recent Labs: 11/27/2019: ALT 24 11/29/2019: BUN 24; Creatinine, Ser 0.93; Hemoglobin 13.4; Magnesium 2.0; Platelets 348; Potassium 4.0; Sodium 136   Recent Lipid Panel Lab Results  Component Value Date/Time   CHOL 83 11/25/2019 01:22 PM   TRIG 89 11/25/2019 01:22 PM   HDL 29 (L) 11/25/2019 01:22 PM   CHOLHDL 2.9 11/25/2019 01:22 PM   LDLCALC 36 11/25/2019 01:22 PM   LDLCALC 54 02/11/2018 12:16 PM    Wt Readings from Last 3 Encounters:  12/16/19 234 lb (106.1 kg)  11/26/19 215 lb 6.2 oz (97.7 kg)  11/20/19 234 lb (106.1 kg)     Objective:    Vital Signs:   Today's Vitals   12/23/19 0942  Weight: 223 lb (101.2 kg)  Height: 6' (1.829 m)   Body mass index is 30.24 kg/m. Normal affect. Normal speech pattern and tone. Comfortable, no apparent distress, no audible signs of SOB or wheezing.   ASSESSMENT & PLAN:    1. Afib - new diagnosis in setting of COVID pneumonia. Unclear if isolated in setting of COVID or ongoing afib - will arrange nursing visit for EKG and vitals, if back in SR  would plan for event monitor - continue toprol, dilt, eliquis at this time     COVID-19 Education: The signs and symptoms of COVID-19 were discussed with the patient and how to seek care for testing (follow up with PCP or arrange E-visit).  The importance of social distancing was discussed today.  Time:   Today, I have spent 14 minutes with the patient with telehealth technology discussing the above problems.     Medication Adjustments/Labs and Tests Ordered: Current medicines are reviewed at length with the patient today.  Concerns regarding medicines are outlined above.   Tests Ordered: No orders of the defined types were placed in this encounter.   Medication Changes: No orders of the defined types were placed in this encounter.   Follow Up:  pending  Signed, Carlyle Dolly, MD  12/23/2019 8:09 AM    [place]

## 2019-12-23 NOTE — Patient Instructions (Addendum)
Your physician recommends that you schedule a follow-up appointment PENDING WITH DR [facility] AND 1 WEEK NURSE APPOINTMENT FOR VITALS AND EKG  Your physician recommends that you continue on your current medications as directed. Please refer to the Current Medication list given to you today.  Thank you for choosing [facility]!!

## 2019-12-31 ENCOUNTER — Other Ambulatory Visit: Payer: Self-pay

## 2019-12-31 ENCOUNTER — Ambulatory Visit (INDEPENDENT_AMBULATORY_CARE_PROVIDER_SITE_OTHER): Payer: BC Managed Care – PPO

## 2019-12-31 VITALS — BP 150/80 | HR 82 | Temp 98.0°F | Ht 72.0 in | Wt 228.0 lb

## 2019-12-31 DIAGNOSIS — I480 Paroxysmal atrial fibrillation: Secondary | ICD-10-CM

## 2019-12-31 NOTE — Patient Instructions (Signed)
We will show your EKG to Dr.Branch and call you. Continue all your medications for now. Dr.Branch will let us know if there are any changes.     Thank you for choosing [facility] !

## 2020-01-07 ENCOUNTER — Other Ambulatory Visit: Payer: Self-pay

## 2020-01-07 NOTE — Telephone Encounter (Signed)
Returned call to [facility]. Pt needed refill on Eliquis. Gave verbal.

## 2020-01-07 NOTE — Telephone Encounter (Signed)
Per [employer] Pt recently discharged. Pharmacy need med list.   Thanks renee

## 2020-01-08 ENCOUNTER — Other Ambulatory Visit: Payer: Self-pay

## 2020-01-08 DIAGNOSIS — I10 Essential (primary) hypertension: Secondary | ICD-10-CM | POA: Diagnosis not present

## 2020-01-08 DIAGNOSIS — E1169 Type 2 diabetes mellitus with other specified complication: Secondary | ICD-10-CM | POA: Diagnosis not present

## 2020-01-08 DIAGNOSIS — E782 Mixed hyperlipidemia: Secondary | ICD-10-CM | POA: Diagnosis not present

## 2020-01-08 DIAGNOSIS — I4891 Unspecified atrial fibrillation: Secondary | ICD-10-CM | POA: Diagnosis not present

## 2020-01-08 MED ORDER — APIXABAN 5 MG PO TABS: 5.0000 mg | ORAL_TABLET | Freq: Two times a day (BID) | ORAL | 3 refills | Status: DC

## 2020-01-08 NOTE — Telephone Encounter (Signed)
Refilled

## 2020-01-08 NOTE — Telephone Encounter (Signed)
01-08-20/need refill-eliquis per [employer].

## 2020-01-12 ENCOUNTER — Telehealth: Payer: Self-pay | Admitting: Cardiology

## 2020-01-12 ENCOUNTER — Telehealth: Payer: Self-pay

## 2020-01-12 MED ORDER — ATORVASTATIN CALCIUM 20 MG PO TABS: 20.0000 mg | ORAL_TABLET | Freq: Every day | ORAL | 3 refills | Status: DC

## 2020-01-12 NOTE — Telephone Encounter (Signed)
Needing refill of atorvastatin (LIPITOR) 20 MG tablet [id number]  Sent to [facility]-- pt was prescribed this while in the hospital

## 2020-01-12 NOTE — Telephone Encounter (Signed)
01-12-20/Pt asking if there are coupons to cover blood thinner medication  Please call [phone]  Thanks renee

## 2020-01-12 NOTE — Telephone Encounter (Signed)
Returned pt call. No answer, left message for pt to return call.  

## 2020-01-12 NOTE — Telephone Encounter (Signed)
Refill sent.

## 2020-01-13 DIAGNOSIS — H2000 Unspecified acute and subacute iridocyclitis: Secondary | ICD-10-CM | POA: Diagnosis not present

## 2020-01-15 NOTE — Telephone Encounter (Signed)
Pt has [employer] insurance.I mailed him an Eliquis card which he can call Eliquis to see what he qualifies for.

## 2020-01-15 NOTE — Telephone Encounter (Signed)
Patient called back asking for someone to call in reference to getting coupons for Eliquis medication Asked that is he is not home to please leave a message about it.

## 2020-02-18 DIAGNOSIS — H2000 Unspecified acute and subacute iridocyclitis: Secondary | ICD-10-CM | POA: Diagnosis not present

## 2020-02-18 DIAGNOSIS — H2512 Age-related nuclear cataract, left eye: Secondary | ICD-10-CM | POA: Diagnosis not present

## 2020-03-30 DIAGNOSIS — H25812 Combined forms of age-related cataract, left eye: Secondary | ICD-10-CM | POA: Diagnosis not present

## 2020-03-30 DIAGNOSIS — Z01818 Encounter for other preprocedural examination: Secondary | ICD-10-CM | POA: Diagnosis not present

## 2020-03-31 ENCOUNTER — Telehealth: Payer: Self-pay

## 2020-03-31 NOTE — Telephone Encounter (Signed)
   [facility] Pre-operative Risk Assessment    Request for surgical clearance:  1. What type of surgery is being performed? CATARACT EXTRACTION BY PE -IOL LEFT  2. When is this surgery scheduled? 04/11/20   3. What type of clearance is required (medical clearance vs. Pharmacy clearance to hold med vs. Both)? MEDICAL  4. Are there any medications that need to be held prior to surgery and how long? DOESN'T WANT BLOOD THINNERS HELD   5. Practice name and name of physician performing surgery? [facility]   6. What is your office phone number [phone]    7.   What is your office fax number [phone]  8.   Anesthesia type (None, local, MAC, general) ? IV SEDATION   Jacinta Shoe 03/31/2020, 5:18 PM  _________________________________________________________________   (provider comments below)

## 2020-04-01 ENCOUNTER — Other Ambulatory Visit: Payer: Self-pay

## 2020-04-01 ENCOUNTER — Encounter (HOSPITAL_COMMUNITY): Payer: Self-pay | Admitting: *Deleted

## 2020-04-01 ENCOUNTER — Emergency Department (HOSPITAL_COMMUNITY)
Admission: EM | Admit: 2020-04-01 | Discharge: 2020-04-01 | Disposition: A | Discharge status: Home / Self Care | Payer: BC Managed Care – PPO | Attending: Emergency Medicine | Admitting: Emergency Medicine

## 2020-04-01 DIAGNOSIS — E119 Type 2 diabetes mellitus without complications: Secondary | ICD-10-CM | POA: Insufficient documentation

## 2020-04-01 DIAGNOSIS — L989 Disorder of the skin and subcutaneous tissue, unspecified: Secondary | ICD-10-CM | POA: Insufficient documentation

## 2020-04-01 DIAGNOSIS — Z7984 Long term (current) use of oral hypoglycemic drugs: Secondary | ICD-10-CM | POA: Insufficient documentation

## 2020-04-01 DIAGNOSIS — Z7901 Long term (current) use of anticoagulants: Secondary | ICD-10-CM | POA: Diagnosis not present

## 2020-04-01 DIAGNOSIS — I480 Paroxysmal atrial fibrillation: Secondary | ICD-10-CM | POA: Insufficient documentation

## 2020-04-01 DIAGNOSIS — N3000 Acute cystitis without hematuria: Secondary | ICD-10-CM | POA: Insufficient documentation

## 2020-04-01 DIAGNOSIS — Z79899 Other long term (current) drug therapy: Secondary | ICD-10-CM | POA: Insufficient documentation

## 2020-04-01 DIAGNOSIS — I10 Essential (primary) hypertension: Secondary | ICD-10-CM | POA: Diagnosis not present

## 2020-04-01 DIAGNOSIS — K625 Hemorrhage of anus and rectum: Secondary | ICD-10-CM | POA: Diagnosis not present

## 2020-04-01 LAB — CBC WITH DIFFERENTIAL/PLATELET
Abs Immature Granulocytes: 0.02 10*3/uL (ref 0.00–0.07)
Basophils Absolute: 0 10*3/uL (ref 0.0–0.1)
Basophils Relative: 1 %
Eosinophils Absolute: 0.2 10*3/uL (ref 0.0–0.5)
Eosinophils Relative: 4 %
HCT: 43.9 % (ref 39.0–52.0)
Hemoglobin: 14.3 g/dL (ref 13.0–17.0)
Immature Granulocytes: 0 %
Lymphocytes Relative: 32 %
Lymphs Abs: 2.1 10*3/uL (ref 0.7–4.0)
MCH: 28.4 pg (ref 26.0–34.0)
MCHC: 32.6 g/dL (ref 30.0–36.0)
MCV: 87.1 fL (ref 80.0–100.0)
Monocytes Absolute: 0.7 10*3/uL (ref 0.1–1.0)
Monocytes Relative: 11 %
Neutro Abs: 3.5 10*3/uL (ref 1.7–7.7)
Neutrophils Relative %: 52 %
Platelets: 163 10*3/uL (ref 150–400)
RBC: 5.04 MIL/uL (ref 4.22–5.81)
RDW: 13.1 % (ref 11.5–15.5)
WBC: 6.7 10*3/uL (ref 4.0–10.5)
nRBC: 0 % (ref 0.0–0.2)

## 2020-04-01 LAB — URINALYSIS, ROUTINE W REFLEX MICROSCOPIC
Bilirubin Urine: NEGATIVE
Glucose, UA: >=500 mg/dL — AB
Ketones, ur: NEGATIVE mg/dL
Nitrite: NEGATIVE
Protein, ur: 30 mg/dL — AB
Specific Gravity, Urine: 1.016 (ref 1.005–1.030)
WBC, UA: >50 WBC/hpf — ABNORMAL HIGH (ref 0–5)
pH: 6 (ref 5.0–8.0)

## 2020-04-01 LAB — COMPREHENSIVE METABOLIC PANEL
ALT: 22 U/L (ref 0–44)
AST: 15 U/L (ref 15–41)
Albumin: 3.8 g/dL (ref 3.5–5.0)
Alkaline Phosphatase: 109 U/L (ref 38–126)
Anion gap: 10 (ref 5–15)
BUN: 12 mg/dL (ref 8–23)
CO2: 27 mmol/L (ref 22–32)
Calcium: 9.3 mg/dL (ref 8.9–10.3)
Chloride: 101 mmol/L (ref 98–111)
Creatinine, Ser: 0.73 mg/dL (ref 0.61–1.24)
GFR calc Af Amer: >60 mL/min (ref >60)
GFR calc non Af Amer: >60 mL/min (ref >60)
Glucose, Bld: 195 mg/dL — ABNORMAL HIGH (ref 70–99)
Potassium: 3.9 mmol/L (ref 3.5–5.1)
Sodium: 138 mmol/L (ref 135–145)
Total Bilirubin: 0.6 mg/dL (ref 0.3–1.2)
Total Protein: 7.4 g/dL (ref 6.5–8.1)

## 2020-04-01 MED ORDER — CEPHALEXIN 500 MG PO CAPS: 500.0000 mg | ORAL_CAPSULE | Freq: Four times a day (QID) | ORAL | 0 refills | Status: DC

## 2020-04-01 MED ORDER — DOUBLE ANTIBIOTIC 500-10000 UNIT/GM EX OINT
TOPICAL_OINTMENT | Freq: Once | CUTANEOUS | Status: AC
  Administered 2020-04-01: 5 via TOPICAL
  Filled 2020-04-01: qty 5

## 2020-04-01 NOTE — ED Provider Notes (Signed)
[facility] EMERGENCY DEPARTMENT Provider Note   CSN: [id number] Arrival date & time: 04/01/20  1207     History Chief Complaint  Patient presents with  . Rectal Bleeding    Christopher Sullivan is a 64 y.o. male.  Patient awoke with blood between his legs.  Patient does not know where the bleeding came from.  Patient states he has a little growth on his scrotum that he needs to see someone about  The history is provided by the patient. No language interpreter was used.  Rectal Bleeding Quality:  Bright red Amount:  Moderate Timing:  Intermittent Chronicity:  New Context: not anal fissures   Similar prior episodes: no   Relieved by:  Nothing Worsened by:  Nothing Ineffective treatments:  None tried Associated symptoms: no abdominal pain   Risk factors: no anticoagulant use        Past Medical History:  Diagnosis Date  . Bilateral primary osteoarthritis of knee   . Diabetes mellitus without complication ([place])   . GERD (gastroesophageal reflux disease)   . Hyperlipidemia   . Hypertension     Patient Active Problem List   Diagnosis Date Noted  . Lab test positive for detection of COVID-19 virus 11/26/2019  . Pneumonia due to COVID-19 virus 11/26/2019  . Hypokalemia 11/26/2019  . Hyperbilirubinemia 11/26/2019  . Hypoalbuminemia 11/26/2019  . Elevated d-dimer 11/26/2019  . Uncontrolled type 2 diabetes mellitus ([place]) 11/26/2019  . Elevated ferritin 11/26/2019  . Hyponatremia 11/26/2019  . AF (paroxysmal atrial fibrillation) ([place]) 11/26/2019  . Failure to thrive in adult 11/25/2019  . Bilateral primary osteoarthritis of knee 02/12/2019  . Angioedema 12/12/2018  . Dysphagia 03/13/2018  . Encounter for screening colonoscopy 03/13/2018  . Essential hypertension 02/11/2018  . Type 2 diabetes mellitus without complication, without long-term current use of insulin ([place]) 12/11/2017  . Hyperlipidemia associated with type 2 diabetes mellitus ([place]) 12/11/2017    Past Surgical  History:  Procedure Laterality Date  . CATARACT EXTRACTION    . None         Family History  Problem Relation Age of Onset  . Hypertension Mother   . Diabetes Mother   . Arthritis Mother   . Heart disease Mother   . Miscarriages / [place] Mother   . Stroke Mother   . Depression Father   . Mental illness Father   . Lupus Sister   . Diabetes Daughter   . Hypertension Daughter   . Colon cancer Neg Hx   . Colon polyps Neg Hx     Social History   Tobacco Use  . Smoking status: Never Smoker  . Smokeless tobacco: Never Used  Substance Use Topics  . Alcohol use: No  . Drug use: No    Home Medications Prior to Admission medications   Medication Sig Start Date End Date Taking? Authorizing Provider  apixaban (ELIQUIS) 5 MG TABS tablet Take 1 tablet (5 mg total) by mouth 2 (two) times daily. 01/08/20  Yes Branch, Alphonse Guild, MD  atorvastatin (LIPITOR) 20 MG tablet Take 1 tablet (20 mg total) by mouth daily. 01/12/20  Yes Herminio Commons, MD  glipiZIDE (GLUCOTROL XL) 10 MG 24 hr tablet Take 1 tablet (10 mg total) by mouth daily with breakfast. 08/18/18  Yes Hagler, Apolonio Schneiders, MD  JANUVIA 100 MG tablet Take 100 mg by mouth daily. 10/20/19  Yes [provider]  metFORMIN (GLUCOPHAGE) 1000 MG tablet Take 1,000 mg by mouth 2 (two) times daily with a meal.  Yes [provider]  timolol (TIMOPTIC) 0.5 % ophthalmic solution Place 1 drop into the left eye in the morning and at bedtime. 01/13/20  Yes [provider]  cephALEXin (KEFLEX) 500 MG capsule Take 1 capsule (500 mg total) by mouth 4 (four) times daily. 04/01/20   Milton Ferguson, MD  diltiazem (CARDIZEM CD) 120 MG 24 hr capsule Take 1 capsule (120 mg total) by mouth daily. Patient not taking: Reported on 04/01/2020 11/29/19 11/28/20  Lavina Hamman, MD  metoprolol succinate (TOPROL-XL) 25 MG 24 hr tablet Take 1 tablet (25 mg total) by mouth daily. Patient not taking: Reported on 04/01/2020 08/18/18   [name], MD  zinc sulfate 220 (50 Zn) MG capsule Take 1 capsule (220 mg total) by mouth daily. Patient not taking: Reported on 04/01/2020 11/30/19   Lavina Hamman, MD    Allergies    Lisinopril  Review of Systems   Review of Systems  Constitutional: Negative for appetite change and fatigue.  HENT: Negative for congestion, ear discharge and sinus pressure.   Eyes: Negative for discharge.  Respiratory: Negative for cough.   Cardiovascular: Negative for chest pain.  Gastrointestinal: Positive for hematochezia. Negative for abdominal pain and diarrhea.  Genitourinary: Negative for frequency and hematuria.       Bleeding noticed between legs  Musculoskeletal: Negative for back pain.  Skin: Negative for rash.  Neurological: Negative for seizures and headaches.  Psychiatric/Behavioral: Negative for hallucinations.    Physical Exam Updated Vital Signs BP 133/82   Pulse 71   Temp 98.4 F (36.9 C)   Resp 20   SpO2 98%   Physical Exam Vitals and nursing note reviewed.  Constitutional:      Appearance: He is well-developed.  HENT:     Head: Normocephalic.     Nose: Nose normal.  Eyes:     General: No scleral icterus.    Conjunctiva/sclera: Conjunctivae normal.  Neck:     Thyroid: No thyromegaly.  Cardiovascular:     Rate and Rhythm: Normal rate and regular rhythm.     Heart sounds: No murmur. No friction rub. No gallop.   Pulmonary:     Breath sounds: No stridor. No wheezing or rales.  Chest:     Chest wall: No tenderness.  Abdominal:     General: There is no distension.     Tenderness: There is no abdominal tenderness. There is no rebound.  Genitourinary:    Comments: Growth on scrotum that looks a little irritated and blood might be coming from it,   rectal exam brown stool heme-negative Musculoskeletal:        General: Normal range of motion.     Cervical back: Neck supple.  Lymphadenopathy:     Cervical: No cervical adenopathy.  Skin:    Findings: No erythema or  rash.  Neurological:     Mental Status: He is alert and oriented to person, place, and time.     Motor: No abnormal muscle tone.     Coordination: Coordination normal.  Psychiatric:        Behavior: Behavior normal.     ED Results / Procedures / Treatments   Labs (all labs ordered are listed, but only abnormal results are displayed) Labs Reviewed  COMPREHENSIVE METABOLIC PANEL - Abnormal; Notable for the following components:      Result Value   Glucose, Bld 195 (*)    All other components within normal limits  URINALYSIS, ROUTINE W REFLEX MICROSCOPIC - Abnormal; Notable  for the following components:   APPearance CLOUDY (*)    Glucose, UA >=500 (*)    Hgb urine dipstick SMALL (*)    Protein, ur 30 (*)    Leukocytes,Ua LARGE (*)    WBC, UA >50 (*)    Bacteria, UA RARE (*)    All other components within normal limits  URINE CULTURE  CBC WITH DIFFERENTIAL/PLATELET  OCCULT BLOOD X 1 CARD TO LAB, STOOL    EKG None  Radiology No results found.  Procedures Procedures (including critical care time)  Medications Ordered in ED Medications - No data to display  ED Course  I have reviewed the triage vital signs and the nursing notes.  Pertinent labs & imaging results that were available during my care of the patient were reviewed by me and considered in my medical decision making (see chart for details).    MDM Rules/Calculators/A&P                       Patient with urinary tract infection and a growth on his scrotum which probably caused the bleeding.  He is put on Keflex referred to urology               This patient presents to the ED for concern of rectal bleeding, this involves an extensive number of treatment options, and is a complaint that carries with it a high risk of complications and morbidity.  The differential diagnosis includes hemorrhoids urinary tract infection   Lab Tests:  I Ordered, reviewed, and interpreted labs, which included CBC  chemistries show mild elevation in glucose 195 Medicines ordered:   I ordered medication Keflex for UTI  Imaging Studies ordered:   Additional history obtained:   Additional history obtained from records  Previous records obtained and reviewed   Consultations Obtained:     Reevaluation:  After the interventions stated above, I reevaluated the patient and found unchanged  Critical Interventions:  .   Final Clinical Impression(s) / ED Diagnoses Final diagnoses:  Acute cystitis without hematuria    Rx / DC Orders ED Discharge Orders         Ordered    cephALEXin (KEFLEX) 500 MG capsule  4 times daily     04/01/20 1542           Milton Ferguson, MD 04/02/20 [phone]

## 2020-04-01 NOTE — ED Triage Notes (Signed)
States he woke up in a puddle of blood but is not sure if it is coming from his rectum or his penis

## 2020-04-01 NOTE — Telephone Encounter (Signed)
   Primary Cardiologist: Carlyle Dolly, MD  Chart reviewed as part of pre-operative protocol coverage. Patient was contacted 04/01/2020 in reference to pre-operative risk assessment for pending surgery as outlined below.  Christopher Sullivan was last seen on 12/23/19 by Dr. Harl Bowie.  Since that day, Christopher Sullivan has done well.  He does not have a history of CAD and can complete more than 4.0 METS without angina.  Therefore, based on ACC/AHA guidelines, the patient would be at acceptable risk for the planned procedure without further cardiovascular testing.   I will route this recommendation to the requesting party via Epic fax function and remove from pre-op pool.  Please call with questions.  [place], PA 04/01/2020, 11:41 AM

## 2020-04-01 NOTE — Discharge Instructions (Addendum)
Drink plenty of fluids and follow-up with [facility] next week.  Call them either today or Monday for an appointment.

## 2020-04-04 LAB — POC OCCULT BLOOD, ED: Fecal Occult Bld: NEGATIVE

## 2020-04-04 LAB — URINE CULTURE: Culture: >=100000 — AB

## 2020-04-05 ENCOUNTER — Telehealth: Payer: Self-pay | Admitting: *Deleted

## 2020-04-05 NOTE — Telephone Encounter (Signed)
Post ED Visit - Positive Culture Follow-up  Culture report reviewed by antimicrobial stewardship pharmacist: [place] [place] Team []  Elenor Quinones, Pharm.D. []  Heide Guile, Pharm.D., BCPS AQ-ID []  Parks Neptune, Pharm.D., BCPS []  Alycia Rossetti, Pharm.D., BCPS []  [place], Pharm.D., BCPS, AAHIVP []  Legrand Como, Pharm.D., BCPS, AAHIVP []  Salome Arnt, PharmD, BCPS []  Johnnette Gourd, PharmD, BCPS []  Hughes Better, PharmD, BCPS []  Leeroy Cha, PharmD []  Laqueta Linden, PharmD, BCPS []  Albertina Parr, PharmD Yolanda Bonine, PharmD  [place] Team []  Leodis Sias, PharmD []  Lindell Spar, PharmD []  Royetta Asal, PharmD []  Graylin Shiver, Rph []  Rema Fendt) Glennon Mac, PharmD []  Arlyn Dunning, PharmD []  Netta Cedars, PharmD []  Dia Sitter, PharmD []  Leone Haven, PharmD []  Gretta Arab, PharmD []  Theodis Shove, PharmD []  Peggyann Juba, PharmD []  Reuel Boom, PharmD   Positive urine culture Treated with Cephalexin, organism sensitive to the same and no further patient follow-up is required at this time.  Harlon Flor [facility] 04/05/2020, 10:42 AM

## 2020-04-11 DIAGNOSIS — H25812 Combined forms of age-related cataract, left eye: Secondary | ICD-10-CM | POA: Diagnosis not present

## 2020-04-11 DIAGNOSIS — H2512 Age-related nuclear cataract, left eye: Secondary | ICD-10-CM | POA: Diagnosis not present

## 2020-05-06 DIAGNOSIS — R8290 Unspecified abnormal findings in urine: Secondary | ICD-10-CM | POA: Diagnosis not present

## 2020-05-06 DIAGNOSIS — I4891 Unspecified atrial fibrillation: Secondary | ICD-10-CM | POA: Diagnosis not present

## 2020-05-06 DIAGNOSIS — E1169 Type 2 diabetes mellitus with other specified complication: Secondary | ICD-10-CM | POA: Diagnosis not present

## 2020-05-06 DIAGNOSIS — Z0000 Encounter for general adult medical examination without abnormal findings: Secondary | ICD-10-CM | POA: Diagnosis not present

## 2020-05-06 DIAGNOSIS — I10 Essential (primary) hypertension: Secondary | ICD-10-CM | POA: Diagnosis not present

## 2020-05-06 DIAGNOSIS — Z125 Encounter for screening for malignant neoplasm of prostate: Secondary | ICD-10-CM | POA: Diagnosis not present

## 2020-05-06 DIAGNOSIS — E782 Mixed hyperlipidemia: Secondary | ICD-10-CM | POA: Diagnosis not present

## 2020-05-12 ENCOUNTER — Telehealth: Payer: Self-pay | Admitting: Cardiology

## 2020-05-12 NOTE — Telephone Encounter (Signed)
I will forward to Dr Branch 

## 2020-05-12 NOTE — Telephone Encounter (Signed)
Would like to know when the patient is to follow up with you.

## 2020-05-12 NOTE — Telephone Encounter (Signed)
LM for pt to call back.

## 2020-05-12 NOTE — Telephone Encounter (Signed)
2 months please   Christopher Abts MD

## 2020-05-13 NOTE — Telephone Encounter (Signed)
Fu apt made for 07/20/20

## 2020-06-02 ENCOUNTER — Other Ambulatory Visit: Payer: Self-pay

## 2020-06-02 ENCOUNTER — Ambulatory Visit (INDEPENDENT_AMBULATORY_CARE_PROVIDER_SITE_OTHER): Payer: BC Managed Care – PPO | Admitting: Podiatry

## 2020-06-02 ENCOUNTER — Encounter: Payer: Self-pay | Admitting: Podiatry

## 2020-06-02 VITALS — BP 143/81 | HR 86

## 2020-06-02 DIAGNOSIS — B351 Tinea unguium: Secondary | ICD-10-CM | POA: Diagnosis not present

## 2020-06-02 DIAGNOSIS — E1142 Type 2 diabetes mellitus with diabetic polyneuropathy: Secondary | ICD-10-CM | POA: Diagnosis not present

## 2020-06-02 DIAGNOSIS — M79674 Pain in right toe(s): Secondary | ICD-10-CM

## 2020-06-02 DIAGNOSIS — L84 Corns and callosities: Secondary | ICD-10-CM | POA: Diagnosis not present

## 2020-06-02 DIAGNOSIS — M79675 Pain in left toe(s): Secondary | ICD-10-CM

## 2020-06-02 NOTE — Patient Instructions (Signed)
Diabetes Mellitus and Foot Care Foot care is an important part of your health, especially when you have diabetes. Diabetes may cause you to have problems because of poor blood flow (circulation) to your feet and legs, which can cause your skin to:  Become thinner and drier.  Break more easily.  Heal more slowly.  Peel and crack. You may also have nerve damage (neuropathy) in your legs and feet, causing decreased feeling in them. This means that you may not notice minor injuries to your feet that could lead to more serious problems. Noticing and addressing any potential problems early is the best way to prevent future foot problems. How to care for your feet Foot hygiene  Wash your feet daily with warm water and mild soap. Do not use hot water. Then, pat your feet and the areas between your toes until they are completely dry. Do not soak your feet as this can dry your skin.  Trim your toenails straight across. Do not dig under them or around the cuticle. File the edges of your nails with an emery board or nail file.  Apply a moisturizing lotion or petroleum jelly to the skin on your feet and to dry, brittle toenails. Use lotion that does not contain alcohol and is unscented. Do not apply lotion between your toes. Shoes and socks  Wear clean socks or stockings every day. Make sure they are not too tight. Do not wear knee-high stockings since they may decrease blood flow to your legs.  Wear shoes that fit properly and have enough cushioning. Always look in your shoes before you put them on to be sure there are no objects inside.  To break in new shoes, wear them for just a few hours a day. This prevents injuries on your feet. Wounds, scrapes, corns, and calluses  Check your feet daily for blisters, cuts, bruises, sores, and redness. If you cannot see the bottom of your feet, use a mirror or ask someone for help.  Do not cut corns or calluses or try to remove them with medicine.  If you  find a minor scrape, cut, or break in the skin on your feet, keep it and the skin around it clean and dry. You may clean these areas with mild soap and water. Do not clean the area with peroxide, alcohol, or iodine.  If you have a wound, scrape, corn, or callus on your foot, look at it several times a day to make sure it is healing and not infected. Check for: ? Redness, swelling, or pain. ? Fluid or blood. ? Warmth. ? Pus or a bad smell. General instructions  Do not cross your legs. This may decrease blood flow to your feet.  Do not use heating pads or hot water bottles on your feet. They may burn your skin. If you have lost feeling in your feet or legs, you may not know this is happening until it is too late.  Protect your feet from hot and cold by wearing shoes, such as at the beach or on hot pavement.  Schedule a complete foot exam at least once a year (annually) or more often if you have foot problems. If you have foot problems, report any cuts, sores, or bruises to your health care provider immediately. Contact a health care provider if:  You have a medical condition that increases your risk of infection and you have any cuts, sores, or bruises on your feet.  You have an injury that is not   healing.  You have redness on your legs or feet.  You feel burning or tingling in your legs or feet.  You have pain or cramps in your legs and feet.  Your legs or feet are numb.  Your feet always feel cold.  You have pain around a toenail. Get help right away if:  You have a wound, scrape, corn, or callus on your foot and: ? You have pain, swelling, or redness that gets worse. ? You have fluid or blood coming from the wound, scrape, corn, or callus. ? Your wound, scrape, corn, or callus feels warm to the touch. ? You have pus or a bad smell coming from the wound, scrape, corn, or callus. ? You have a fever. ? You have a red line going up your leg. Summary  Check your feet every day  for cuts, sores, red spots, swelling, and blisters.  Moisturize feet and legs daily.  Wear shoes that fit properly and have enough cushioning.  If you have foot problems, report any cuts, sores, or bruises to your health care provider immediately.  Schedule a complete foot exam at least once a year (annually) or more often if you have foot problems. This information is not intended to replace advice given to you by your health care provider. Make sure you discuss any questions you have with your health care provider. Document Revised: 08/05/2019 Document Reviewed: 12/14/2016 Elsevier Patient Education  2020 Elsevier Inc.  

## 2020-06-02 NOTE — Progress Notes (Signed)
  Subjective:  Patient ID: Christopher Sullivan, male    DOB: 05-04-1956,  MRN: [id number]  Chief Complaint  Patient presents with  . Diabetes    A1C  9.2, diabetic foot exam  . Nail Problem    thick painful toenails  . Callouses    painful callus lesion  . Atrial Fibrillation    patient is taking Eliquis    64 y.o. male presents with the above complaint. History confirmed with patient. Diabetic for many years, not on insulin. Last A1c 9.2%. C/O numbness and tingling in both feet. Works at [employer] in [place]. Wears steel toed boots at work. No hx of ulceration  Objective:  Physical Exam: warm, good capillary refill, DP reduced bilateral, no trophic changes or ulcerative lesions, PT reduced bilateral and reduced sensation at plantar soles and toe pulps. Left Foot: submet callus 5th head and base, mycotic thick crumbling toenails present 1-5  Right submet callus 5th head and base, mycotic thick crumbling toenails present 1-5Foot:    Assessment:   1. Onychomycosis   2. Pain due to onychomycosis of toenails of both feet   3. Type 2 diabetes mellitus with diabetic polyneuropathy, without long-term current use of insulin (HCC)   4. Callus of foot      Plan:  Patient was evaluated and treated and all questions answered.   Patient educated on diabetes. Discussed proper diabetic foot care and discussed risks and complications of disease. Educated patient in depth on reasons to return to the office immediately should he/she discover anything concerning or new on the feet. All questions answered. Discussed proper shoes as well. Follow-up with pedorthist Velora Heckler for diabetic shoe fitting and multi density accomodative insoles. He may be able to wear the insoles in his work boots, if not, having the ED shoes and insoles at all other times will be helpful.  Discussed the etiology and treatment options for the condition in detail with the patient. Educated patient on the  topical and oral treatment options for mycotic nails. Recommended debridement of the nails today. Sharp and mechanical debridement performed of all painful and mycotic nails today. Nails debrided in length and thickness using a nail nipper and a mechanical burr to level of comfort. Discussed treatment options including appropriate shoe gear. Follow up as needed for painful nails.  All symptomatic hyperkeratoses were safely debrided with a sterile #15 blade to patient's level of comfort without incident. We discussed preventative and palliative care of these lesions including supportive and accommodative shoegear, padding, prefabricated and custom molded accommodative orthoses, use of a pumice stone and lotions/creams daily.   Return in about 3 months (around 09/02/2020) for diabetic nail and callus trim.   Lanae Crumbly, DPM 06/02/2020

## 2020-06-24 ENCOUNTER — Ambulatory Visit: Payer: BC Managed Care – PPO | Admitting: Orthotics

## 2020-06-24 ENCOUNTER — Other Ambulatory Visit: Payer: Self-pay

## 2020-06-24 DIAGNOSIS — B351 Tinea unguium: Secondary | ICD-10-CM

## 2020-06-24 DIAGNOSIS — L84 Corns and callosities: Secondary | ICD-10-CM

## 2020-06-24 DIAGNOSIS — M79674 Pain in right toe(s): Secondary | ICD-10-CM

## 2020-06-24 DIAGNOSIS — E1142 Type 2 diabetes mellitus with diabetic polyneuropathy: Secondary | ICD-10-CM

## 2020-06-24 NOTE — Progress Notes (Signed)
Due to financial considerations, patient decided not to get DBS/inserts until he turns 83 in November.

## 2020-07-19 ENCOUNTER — Ambulatory Visit: Payer: BC Managed Care – PPO | Admitting: Urology

## 2020-07-20 ENCOUNTER — Ambulatory Visit: Payer: BC Managed Care – PPO | Admitting: Cardiology

## 2020-08-02 NOTE — Progress Notes (Addendum)
Cardiology Office Note  Date: 08/03/2020   ID: Christopher Sullivan, DOB [birth year], MRN [id number]  PCP:  [name], MD  Cardiologist:  Carlyle Dolly, MD Electrophysiologist:  None   Chief Complaint: Atrial fibrillation  History of Present Illness: Christopher Sullivan is a 64 y.o. male with a history of atrial fibrillation, history of Covid pneumonia, PVCs.  History of irregular heartbeat/PVCs.  2018 Holter monitor with low PVC burden.  Showing occasional PVCs noted with rare couplets and bigeminy approximately 3% of total beats.  Rare PACs noted.  No symptoms.  Patient had Covid pneumonia and admitted January 2021.  Atrial fibrillation noted during that admission.  He was started on Eliquis as well as diltiazem 120 mg daily and Toprol 25 mg.  Last encounter with Dr. Harl Bowie 12/23/2019 via telemedicine visit.  Had no recent PVCs, breathing was improving.  He continued to have ongoing fatigue.  He denied any palpitations.  At last visit Dr. Harl Bowie mentioned if patient was in sinus rhythm nursing visit follow-up for EKG patient needed a cardiac monitor to assess for recurrent atrial fibrillation.  Patient is here for 74-month follow-up.  He denies any issues in the interim since last visit.  He denies any palpitations or arrhythmias, orthostatic symptoms, lightheadedness, dizziness, presyncope or syncopal episodes.  Denies any anginal or exertional symptoms, PND, orthopnea, bleeding, CVA or TIA-like symptoms, claudication-like symptoms, DVT or PE-like symptoms, or lower extremity edema.  EKG today on arrival shows normal sinus rhythm with a rate of 91.  He is compliant with all of his medications including Eliquis, atorvastatin, diltiazem, Toprol-XL.  Blood pressure is elevated today but patient states he did not take his antihypertensive medication yet.    Past Medical History:  Diagnosis Date  . Bilateral primary osteoarthritis of knee   . Diabetes mellitus without complication ([place])   . GERD  (gastroesophageal reflux disease)   . Hyperlipidemia   . Hypertension     Past Surgical History:  Procedure Laterality Date  . CATARACT EXTRACTION    . None      Current Outpatient Medications  Medication Sig Dispense Refill  . apixaban (ELIQUIS) 5 MG TABS tablet Take 1 tablet (5 mg total) by mouth 2 (two) times daily. 60 tablet 3  . atorvastatin (LIPITOR) 20 MG tablet Take 1 tablet (20 mg total) by mouth daily. 90 tablet 3  . diltiazem (CARDIZEM CD) 120 MG 24 hr capsule Take 1 capsule (120 mg total) by mouth daily. 60 capsule 0  . glipiZIDE (GLUCOTROL XL) 10 MG 24 hr tablet Take 1 tablet (10 mg total) by mouth daily with breakfast. 90 tablet 1  . JANUVIA 100 MG tablet Take 100 mg by mouth daily.    . metFORMIN (GLUCOPHAGE) 1000 MG tablet Take 1,000 mg by mouth 2 (two) times daily with a meal.     . metoprolol succinate (TOPROL-XL) 25 MG 24 hr tablet Take 1 tablet (25 mg total) by mouth daily. 90 tablet 3  . timolol (TIMOPTIC) 0.5 % ophthalmic solution Place 1 drop into the left eye in the morning and at bedtime.    Marland Kitchen zinc sulfate 220 (50 Zn) MG capsule Take 1 capsule (220 mg total) by mouth daily. 30 capsule 0   No current facility-administered medications for this visit.   Allergies:  Lisinopril   Social History: The patient  reports that he has never smoked. He has never used smokeless tobacco. He reports that he does not drink alcohol and does not use drugs.  Family History: The patient's family history includes Arthritis in his mother; Depression in his father; Diabetes in his daughter and mother; Heart disease in his mother; Hypertension in his daughter and mother; Lupus in his sister; Mental illness in his father; Miscarriages / [place] in his mother; Stroke in his mother.   ROS:  Please see the history of present illness. Otherwise, complete review of systems is positive for none.  All other systems are reviewed and negative.   Physical Exam: VS:  BP 140/80   Pulse 92    Ht 6' (1.829 m)   Wt 232 lb 3.2 oz (105.3 kg)   SpO2 98%   BMI 31.49 kg/m , BMI Body mass index is 31.49 kg/m.  Wt Readings from Last 3 Encounters:  08/03/20 232 lb 3.2 oz (105.3 kg)  12/31/19 228 lb (103.4 kg)  12/23/19 223 lb (101.2 kg)     Neck: Supple, no elevated JVP or carotid bruits, no thyromegaly. Lungs: Clear to auscultation, nonlabored breathing at rest. Cardiac: Regular rate and rhythm, no S3 or significant systolic murmur, no pericardial rub. Extremities: No pitting edema, distal pulses 2+. Skin: Warm and dry. Musculoskeletal: No kyphosis. Neuropsychiatric: Alert and oriented x3, affect grossly appropriate.  ECG:  An ECG dated 08/03/2020 was personally reviewed today and demonstrated:  Normal sinus rhythm rate of 91, nonspecific T wave abnormality  Recent Labwork: 11/29/2019: Magnesium 2.0 04/01/2020: ALT 22; AST 15; BUN 12; Creatinine, Ser 0.73; Hemoglobin 14.3; Platelets 163; Potassium 3.9; Sodium 138     Component Value Date/Time   CHOL 83 11/25/2019 1322   TRIG 89 11/25/2019 1322   HDL 29 (L) 11/25/2019 1322   CHOLHDL 2.9 11/25/2019 1322   VLDL 18 11/25/2019 1322   LDLCALC 36 11/25/2019 1322   LDLCALC 54 02/11/2018 1216    Other Studies Reviewed Today:  09/2017 holter 24-hour Holter monitor reviewed. Sinus rhythm is present throughout. Heart rate ranged from 58 bpm up to 111 bpm with average heart rate 79 bpm. There were no significant pauses. Occasional PVCs were noted with rare couplets and bigeminy (approximately 3% of total beats). Rare PACs noted.  Jan 2021 echo IMPRESSIONS 1. Left ventricular ejection fraction, by visual estimation, is 55 to 60%. The left ventricle has normal function. Left ventricular septal wall thickness was normal. Normal left ventricular posterior wall thickness. There is no left ventricular  hypertrophy. 2. Left ventricular diastolic function could not be evaluated. 3. Mildly dilated left ventricular internal cavity  size. 4. The left ventricle has no regional wall motion abnormalities. 5. Mild hypokinesis of the basal inferior myocardium. 6. Global right ventricle has normal systolic function.The right ventricular size is normal. No increase in right ventricular wall thickness. 7. Left atrial size was mildly dilated. 8. Right atrial size was normal. 9. Mild mitral annular calcification. 10. The mitral valve is normal in structure. Trivial mitral valve regurgitation. No evidence of mitral stenosis. 11. The tricuspid valve is normal in structure. 12. The aortic valve is tricuspid. Aortic valve regurgitation is not visualized. No evidence of aortic valve sclerosis or stenosis. 13. The pulmonic valve was normal in structure. Pulmonic valve regurgitation is not visualized. 14. Normal pulmonary artery systolic pressure. 15. The inferior vena cava is normal in size with greater than 50% respiratory variability, suggesting right atrial pressure of 3 mmHg  Assessment and Plan:  1. Atrial fibrillation, unspecified type ([place])   2. Essential hypertension    1. Atrial fibrillation, unspecified type [facility]) He denies any recent palpitations or arrhythmias,  sensation of fluttering or racing heart.  EKG today shows normal sinus rhythm with a rate of 91.  He continues on diltiazem 120 mg daily.  Toprol-XL 25 mg daily as well as Eliquis 5 mg p.o. twice daily.  No bleeding noted.  Please get a 14-day ZIO monitor to evaluate for PAF.   2.  Hypertension Blood pressure is elevated on arrival today at 140/80.  However, patient states he has not taken his a.m. antihypertensive medication today.  Advised to take Toprol XL 25 mg as soon as possible.  Continue Cardizem 120 mg p.o. daily.  Medication Adjustments/Labs and Tests Ordered: Current medicines are reviewed at length with the patient today.  Concerns regarding medicines are outlined above.   Disposition: Follow-up with Dr. Harl Bowie or APP 6  months.  Signed, Levell July, NP 08/03/2020 10:34 AM    [facility] [place], [place], [place] [address] Phone: [phone]; Fax: [phone]

## 2020-08-03 ENCOUNTER — Other Ambulatory Visit: Payer: Self-pay

## 2020-08-03 ENCOUNTER — Telehealth: Payer: Self-pay | Admitting: Family Medicine

## 2020-08-03 ENCOUNTER — Encounter: Payer: Self-pay | Admitting: Family Medicine

## 2020-08-03 ENCOUNTER — Ambulatory Visit (INDEPENDENT_AMBULATORY_CARE_PROVIDER_SITE_OTHER): Payer: BC Managed Care – PPO | Admitting: Family Medicine

## 2020-08-03 VITALS — BP 140/80 | HR 92 | Ht 72.0 in | Wt 232.2 lb

## 2020-08-03 DIAGNOSIS — I10 Essential (primary) hypertension: Secondary | ICD-10-CM

## 2020-08-03 DIAGNOSIS — I4891 Unspecified atrial fibrillation: Secondary | ICD-10-CM | POA: Diagnosis not present

## 2020-08-03 NOTE — Patient Instructions (Addendum)
Medication Instructions:   Your physician recommends that you continue on your current medications as directed. Please refer to the Current Medication list given to you today.  Labwork:  None  Testing/Procedures: Your physician has recommended that you wear an event monitor for 14 days. Event monitors are medical devices that record the heart's electrical activity. Doctors most often [place] these monitors to diagnose arrhythmias. Arrhythmias are problems with the speed or rhythm of the heartbeat. The monitor is a small, portable device. You can wear one while you do your normal daily activities. This is usually used to diagnose what is causing palpitations/syncope (passing out). [employer] will send this monitor to your home address.   Follow-Up:  Your physician recommends that you schedule a follow-up appointment in: 6 months.   Any Other Special Instructions Will Be Listed Below (If Applicable).  If you need a refill on your cardiac medications before your next appointment, please call your pharmacy.

## 2020-08-03 NOTE — Telephone Encounter (Signed)
PERCERT FOR   14 DAY ZIO AT dx: atrial fibrillation

## 2020-08-05 ENCOUNTER — Ambulatory Visit: Payer: BC Managed Care – PPO | Admitting: Urology

## 2020-09-08 ENCOUNTER — Ambulatory Visit: Payer: BC Managed Care – PPO | Admitting: Podiatry
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
## 2020-09-12 ENCOUNTER — Other Ambulatory Visit: Payer: Self-pay | Admitting: Surgery

## 2020-09-12 DIAGNOSIS — E042 Nontoxic multinodular goiter: Secondary | ICD-10-CM

## 2020-09-12 DIAGNOSIS — E041 Nontoxic single thyroid nodule: Secondary | ICD-10-CM

## 2020-09-15 ENCOUNTER — Ambulatory Visit (INDEPENDENT_AMBULATORY_CARE_PROVIDER_SITE_OTHER): Payer: BC Managed Care – PPO | Admitting: Urology

## 2020-09-15 ENCOUNTER — Encounter: Payer: Self-pay | Admitting: Urology

## 2020-09-15 ENCOUNTER — Other Ambulatory Visit: Payer: Self-pay

## 2020-09-15 VITALS — BP 147/82 | HR 92 | Temp 98.0°F | Ht 72.0 in | Wt 243.0 lb

## 2020-09-15 DIAGNOSIS — N509 Disorder of male genital organs, unspecified: Secondary | ICD-10-CM

## 2020-09-15 DIAGNOSIS — R351 Nocturia: Secondary | ICD-10-CM | POA: Insufficient documentation

## 2020-09-15 DIAGNOSIS — N400 Enlarged prostate without lower urinary tract symptoms: Secondary | ICD-10-CM | POA: Diagnosis not present

## 2020-09-15 LAB — MICROSCOPIC EXAMINATION: Renal Epithel, UA: NONE SEEN /hpf

## 2020-09-15 LAB — URINALYSIS, ROUTINE W REFLEX MICROSCOPIC
Bilirubin, UA: NEGATIVE
Ketones, UA: NEGATIVE
Nitrite, UA: NEGATIVE
Protein,UA: NEGATIVE
Specific Gravity, UA: 1.015 (ref 1.005–1.030)
Urobilinogen, Ur: 1 mg/dL (ref 0.2–1.0)
pH, UA: 7 (ref 5.0–7.5)

## 2020-09-15 MED ORDER — ALFUZOSIN HCL ER 10 MG PO TB24: 10.0000 mg | ORAL_TABLET | Freq: Every day | ORAL | 11 refills | Status: DC

## 2020-09-15 MED ORDER — CLOTRIMAZOLE-BETAMETHASONE 1-0.05 % EX CREA: 1.0000 "application " | TOPICAL_CREAM | Freq: Two times a day (BID) | CUTANEOUS | 3 refills | Status: DC

## 2020-09-15 NOTE — Patient Instructions (Signed)
Scrotal Swelling Scrotal swelling refers to a condition in which the sac of skin that contains the testes (scrotum) is enlarged or swollen. Many things can cause the scrotum to enlarge or swell, including:  Fluid around the testicle (hydrocele).  A weakened area in the muscles around the groin (hernia).  An enlarged vein around the testicle (varicocele).  An injury.  An infection.  Certain medical treatments.  Certain medical conditions, such as congestive heart failure.  A recent genital surgery or procedure.  A twisting of the spermatic cord that cuts off blood supply (testicular torsion).  Testicular cancer. Scrotal swelling can happen along with scrotal pain. Follow these instructions at home:  Until the swelling goes away: ? Rest. The best position to rest in is to lie down. ? Limit activity.  Put ice on the scrotum: ? Put ice in a plastic bag. ? Place a towel between your skin and the bag. ? Leave the ice on for 20 minutes, 2-3 times a day for 1-2 days.  Place a rolled towel under your testicles for support.  Wear loose-fitting clothing or an athletic support cup for comfort.  Take over-the-counter and prescription medicines only as told by your health care provider.  Perform a monthly self-exam of the scrotum and penis. Feel for changes. Ask your health care provider how to perform a monthly self-exam if you are unsure. Contact a health care provider if:  You have a sudden pain that is persistent and does not improve.  You have a heavy feeling or notice fluid in the scrotum.  You have pain or burning while urinating.  You have blood in your urine or semen.  You feel a lump around the testicle.  You notice that one testicle is larger than the other. Keep in mind that a small difference in size is normal.  You have a persistent dull ache or pain in your groin or scrotum. Get help right away if:  The pain does not go away.  The pain becomes  severe.  You have a fever or chills.  You have pain or vomiting that cannot be controlled.  One or both sides of the scrotum are very red and swollen.  There is redness spreading upward from your scrotum to your abdomen or downward from your scrotum to your thighs. Summary  Scrotal swelling refers to a condition in which the sac of skin that contains the testes (scrotum) is enlarged.  Many things can cause the scrotum to swell, including hydrocele, a hernia, and a varicocele.  Limiting activity and icing the scrotum may help reduce swelling and pain.  Contact your health care provider if you develop scrotal pain that is sudden and persistent, or if you have pain while urinating. Do this also if you feel a lump around the testicle or notice blood in your urine or semen.  Get help right away for uncontrolled pain or vomiting, for very red and swollen scrotum, or for fever or chills. This information is not intended to replace advice given to you by your health care provider. Make sure you discuss any questions you have with your health care provider. Document Revised: 10/25/2017 Document Reviewed: 01/28/2017 Elsevier Patient Education  [place].

## 2020-09-15 NOTE — Progress Notes (Signed)
09/15/2020 9:12 AM   Christopher Sullivan 05-09-1956 [id number]  Referring provider: [name], MD [place],  [place] [address]  Scrotal lesion and nocturia  HPI: [name] is a 64yo here for evaluation of a scrotal lesion and worsening LUTS. He noticed the scrotal lesion 3-4 years ago and was increasing in size and is started draining  In 03/2020 and he presented to [facility] and was treated for a UTI at that time. He has worsening urinary urgnecy, daily urge incontinence, urinary frequency, Nocturia 2-3, fair stream, dribbling. He uses 3-4 pads per day. No previous BPH therapy. UA is concerning for infection. He also has 3+ Glucose in urine.     PMH: Past Medical History:  Diagnosis Date  . Bilateral primary osteoarthritis of knee   . Diabetes mellitus without complication ([place])   . GERD (gastroesophageal reflux disease)   . Hypercholesteremia   . Hyperlipidemia   . Hypertension     Surgical History: Past Surgical History:  Procedure Laterality Date  . CATARACT EXTRACTION    . None      Home Medications:  Allergies as of 09/15/2020      Reactions   Lisinopril Swelling      Medication List       Accurate as of September 15, 2020  9:12 AM. If you have any questions, ask your nurse or doctor.        apixaban 5 MG Tabs tablet Commonly known as: ELIQUIS Take 1 tablet (5 mg total) by mouth 2 (two) times daily.   atorvastatin 20 MG tablet Commonly known as: LIPITOR Take 1 tablet (20 mg total) by mouth daily.   diltiazem 120 MG 24 hr capsule Commonly known as: Cardizem CD Take 1 capsule (120 mg total) by mouth daily.   glipiZIDE 10 MG 24 hr tablet Commonly known as: Glucotrol XL Take 1 tablet (10 mg total) by mouth daily with breakfast.   Januvia 100 MG tablet Generic drug: sitaGLIPtin Take 100 mg by mouth daily.   metFORMIN 1000 MG tablet Commonly known as: GLUCOPHAGE Take 1,000 mg by mouth 2 (two) times daily with a meal.   metoprolol  succinate 25 MG 24 hr tablet Commonly known as: TOPROL-XL Take 1 tablet (25 mg total) by mouth daily.   timolol 0.5 % ophthalmic solution Commonly known as: TIMOPTIC Place 1 drop into the left eye in the morning and at bedtime.   zinc sulfate 220 (50 Zn) MG capsule Take 1 capsule (220 mg total) by mouth daily.       Allergies:  Allergies  Allergen Reactions  . Lisinopril Swelling    Family History: Family History  Problem Relation Age of Onset  . Hypertension Mother   . Diabetes Mother   . Arthritis Mother   . Heart disease Mother   . Miscarriages / [place] Mother   . Stroke Mother   . Depression Father   . Mental illness Father   . Lupus Sister   . Diabetes Daughter   . Hypertension Daughter   . Colon cancer Neg Hx   . Colon polyps Neg Hx     Social History:  reports that he has never smoked. He has never used smokeless tobacco. He reports that he does not drink alcohol and does not use drugs.  ROS: All other review of systems were reviewed and are negative except what is noted above in HPI  Physical Exam: BP (!) 147/82   Pulse 92   Temp 98  F (36.7 C)   Ht 6' (1.829 m)   Wt 243 lb (110.2 kg)   BMI 32.96 kg/m   Constitutional:  Alert and oriented, No acute distress. HEENT: [place] AT, moist mucus membranes.  Trachea midline, no masses. Cardiovascular: No clubbing, cyanosis, or edema. Respiratory: Normal respiratory effort, no increased work of breathing. GI: Abdomen is soft, nontender, nondistended, no abdominal masses GU: No CVA tenderness. Uncircumcised phallus. No masses/lesions on penis, testis. 1.5cm papillary lesion on left hemiscrotum. Prostate 40g smooth no nodules no induration.  Lymph: No cervical or inguinal lymphadenopathy. Skin: No rashes, bruises or suspicious lesions. Neurologic: Grossly intact, no focal deficits, moving all 4 extremities. Psychiatric: Normal mood and affect.  Laboratory Data: Lab Results  Component Value Date   WBC 6.7  04/01/2020   HGB 14.3 04/01/2020   HCT 43.9 04/01/2020   MCV 87.1 04/01/2020   PLT 163 04/01/2020    Lab Results  Component Value Date   CREATININE 0.73 04/01/2020    No results found for: PSA  No results found for: TESTOSTERONE  Lab Results  Component Value Date   HGBA1C 9.2 (H) 11/25/2019    Urinalysis    Component Value Date/Time   COLORURINE YELLOW 04/01/2020 1317   APPEARANCEUR CLOUDY (A) 04/01/2020 1317   LABSPEC 1.016 04/01/2020 1317   PHURINE 6.0 04/01/2020 1317   GLUCOSEU >=500 (A) 04/01/2020 1317   HGBUR SMALL (A) 04/01/2020 1317   BILIRUBINUR NEGATIVE 04/01/2020 1317   KETONESUR NEGATIVE 04/01/2020 1317   PROTEINUR 30 (A) 04/01/2020 1317   NITRITE NEGATIVE 04/01/2020 1317   LEUKOCYTESUR LARGE (A) 04/01/2020 1317    Lab Results  Component Value Date   BACTERIA RARE (A) 04/01/2020    Pertinent Imaging:  No results found for this or any previous visit.  No results found for this or any previous visit.  No results found for this or any previous visit.  No results found for this or any previous visit.  No results found for this or any previous visit.  No results found for this or any previous visit.  No results found for this or any previous visit.  No results found for this or any previous visit.   Assessment & Plan:    1. Benign prostatic hyperplasia, unspecified whether lower urinary tract symptoms present, nocturia -We will trial uroxatral 10mg  qhs - Urinalysis, Routine w reflex microscopic  2. Left scrotal lesions -We discussed observation versus excision and he elects for excision. Risks/benefits/alternatives discussed. Patient does not need to stop eliquis  No follow-ups on file.  Nicolette Bang, MD  [facility] [place]

## 2020-09-15 NOTE — Progress Notes (Signed)
Urological Symptom Review  Patient is experiencing the following symptoms: Frequent urination Hard to postpone urination Get up at night to urinate Leakage of urine Erection Problems  Review of Systems  Gastrointestinal (upper)  : Indigestion/heartburn  Gastrointestinal (lower) : Negative for lower GI symptoms  Constitutional : Negative for symptoms  Skin: Itching  Eyes: Negative for eye symptoms  Ear/Nose/Throat : Negative for Ear/Nose/Throat symptoms  Hematologic/Lymphatic: Negative for Hematologic/Lymphatic symptoms  Cardiovascular : Leg swelling  Respiratory : Negative for respiratory symptoms  Endocrine: Excessive thirst  Musculoskeletal: Back pain  Neurological: Negative for neurological symptoms  Psychologic: Negative for psychiatric symptoms

## 2020-09-19 ENCOUNTER — Telehealth: Payer: Self-pay | Admitting: *Deleted

## 2020-09-19 LAB — URINE CULTURE

## 2020-09-20 IMAGING — US US THYROID
1 series · 13 of 25 positions shown · non-contrast
Comparison: CT 12/27/2018

CLINICAL DATA: Thyroid nodule seen on recent CT.

EXAM:
THYROID ULTRASOUND
TECHNIQUE: Ultrasound examination of the thyroid gland and adjacent soft
tissues was performed.

[Series 1: us thyroid · 0.09mm/px · 13 of 73 slices shown]
[im 1/73]
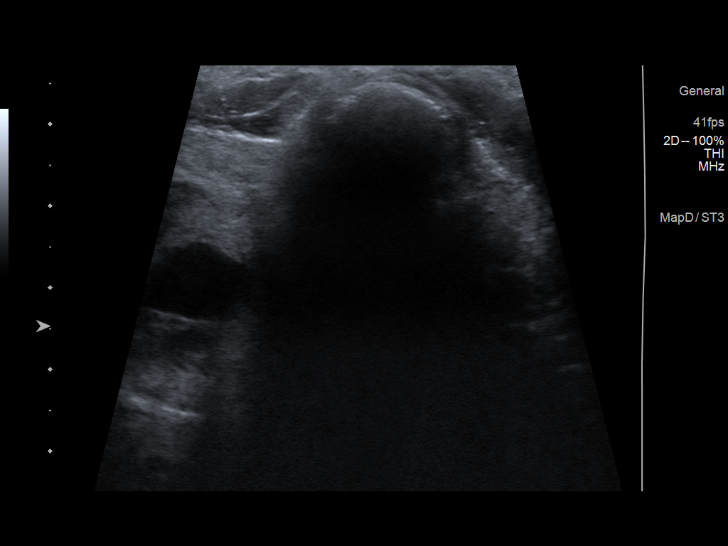
[im 7/73]
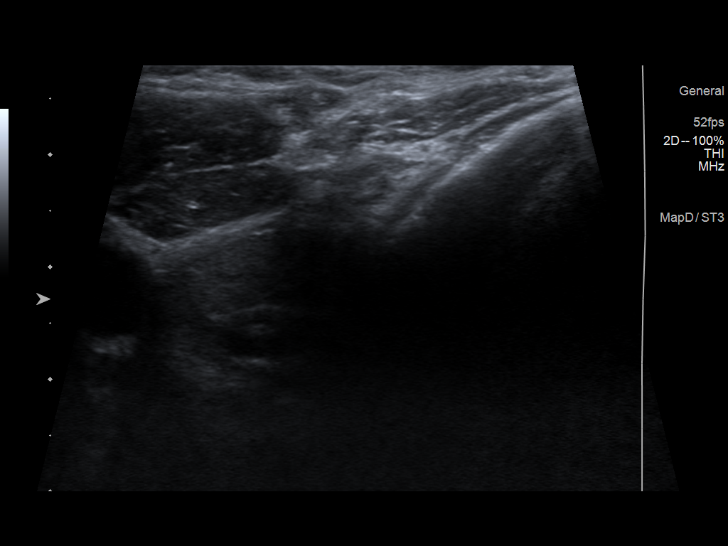
[im 13/73]
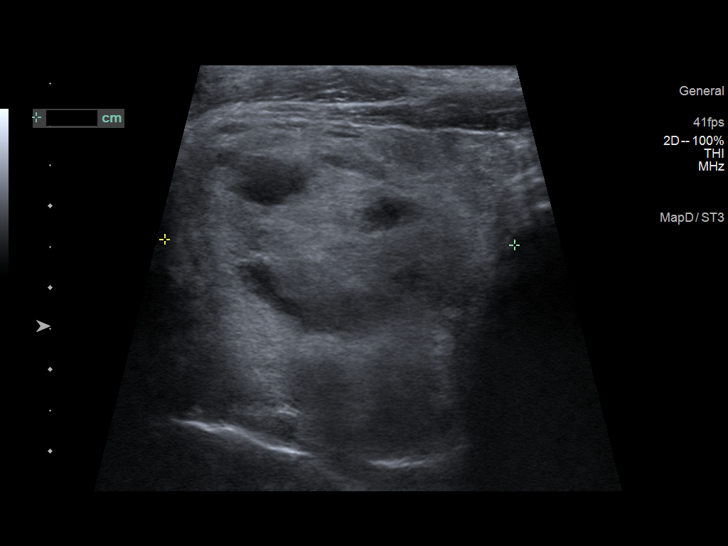
[im 19/73]
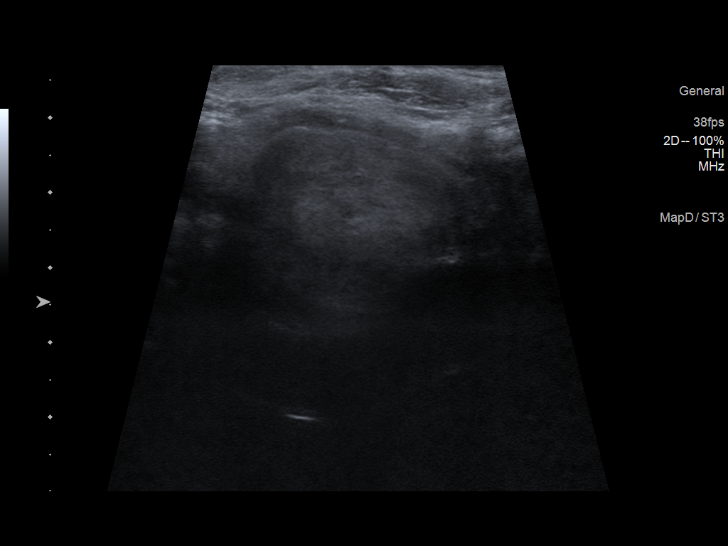
[im 25/73]
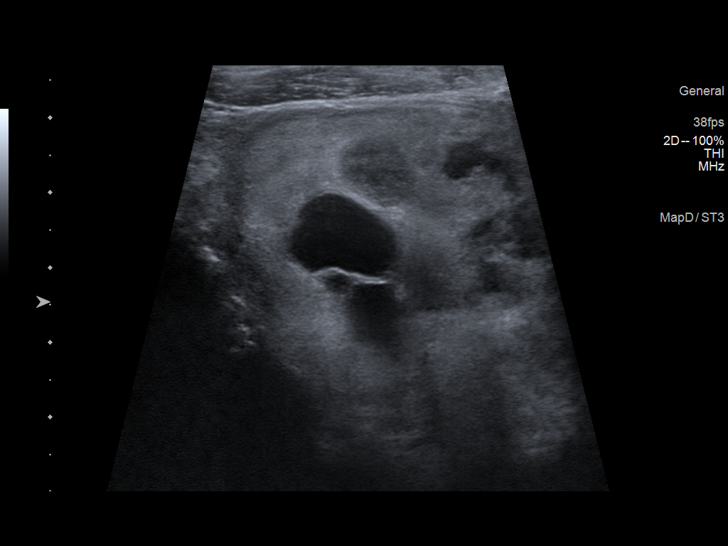
[im 31/73]
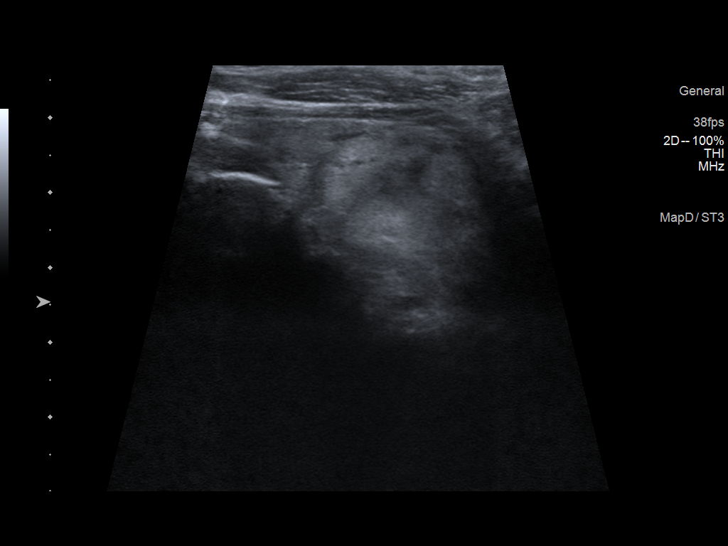
[im 37/73]
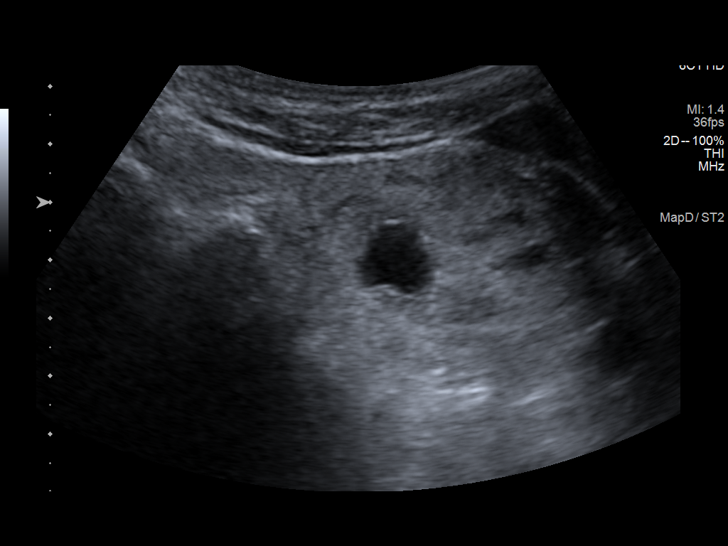
[im 43/73]
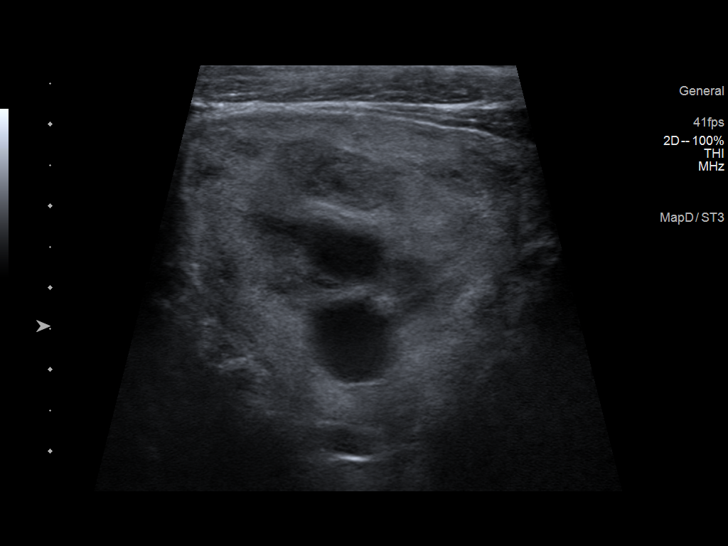
[im 49/73]
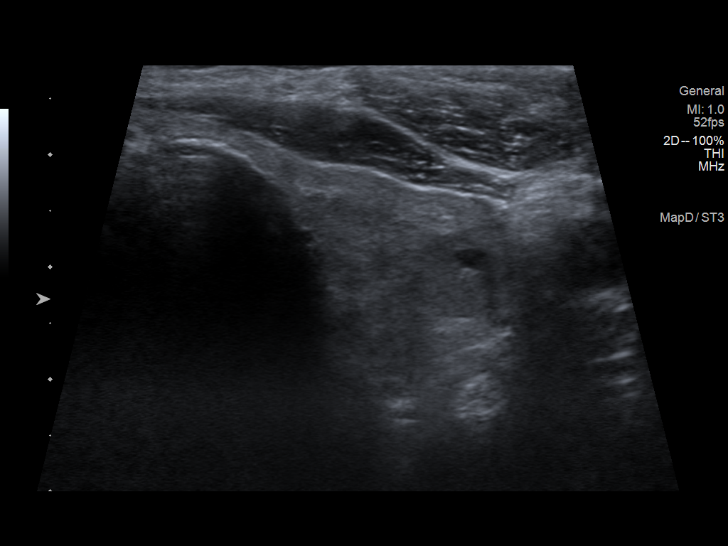
[im 55/73]
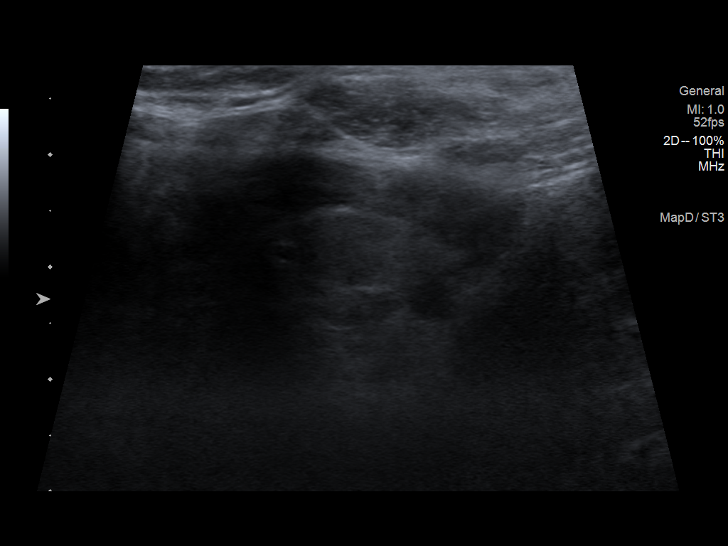
[im 61/73]
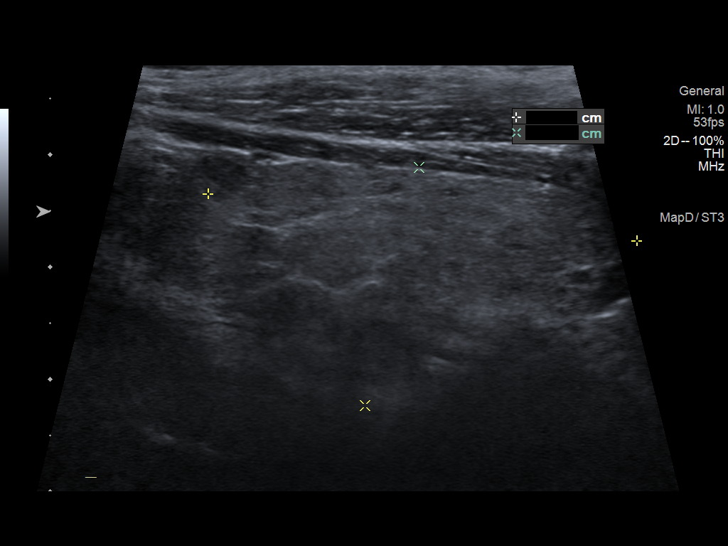
[im 67/73]
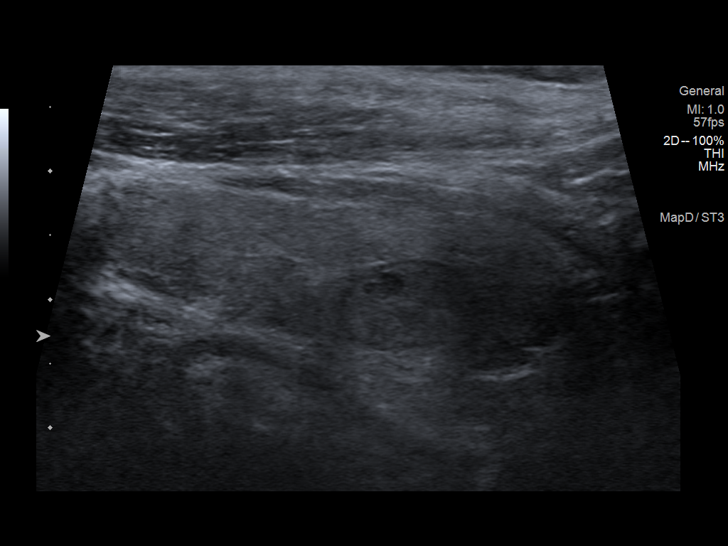
[im 73/73]
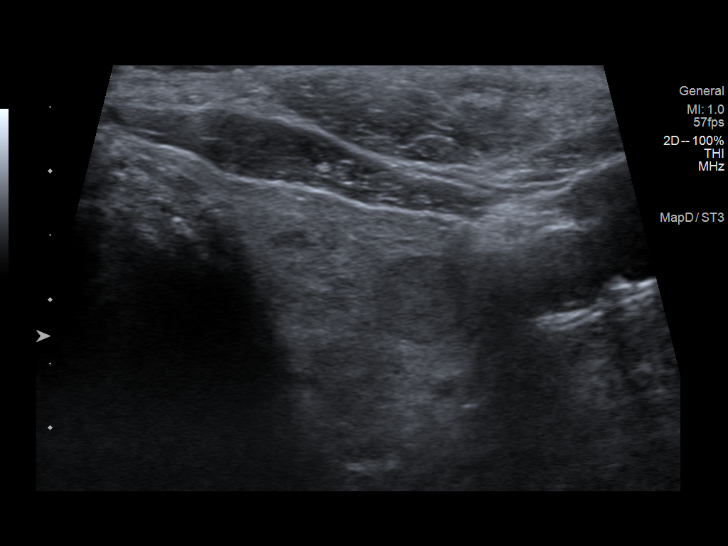

[13 of 25 positions shown; findings below may reference images not displayed]

FINDINGS: Parenchymal Echotexture: Moderately heterogenous

Isthmus: 0.6 cm

Right lobe: 7.4 x 4.5 x 4.3 cm

Left lobe: 3.8 x 2.2 x 2.1 cm

_________________________________________________________

Estimated total number of nodules >/= 1 cm: 2

Number of spongiform nodules >/=  2 cm not described below (TR1): 0

Number of mixed cystic and solid nodules >/= 1.5 cm not described
below (TR2): 0

_________________________________________________________

Nodule # 1:

Location: Right; Mid

Maximum size: 4.3 cm; Other 2 dimensions: 3.1 x 4.0 cm

Composition: solid/almost completely solid (2); scattered cystic
components.

Echogenicity: isoechoic (1)

Shape: not taller-than-wide (0)

Margins: ill-defined (0)

Echogenic foci: none (0)

ACR TI-RADS total points: 3.

ACR TI-RADS risk category: TR3 (3 points).

ACR TI-RADS recommendations:

**Given size (>/= 2.5 cm) and appearance, fine needle aspiration of
this mildly suspicious nodule should be considered based on TI-RADS
criteria.

_________________________________________________________

Nodule # 2:

Location: Left; Mid

Maximum size: 1.2 cm; Other 2 dimensions: 0.7 x 0.7 cm

Composition: solid/almost completely solid (2)

Echogenicity: isoechoic (1)

Shape: not taller-than-wide (0)

Margins: ill-defined (0)

Echogenic foci: none (0)

ACR TI-RADS total points: 3.

ACR TI-RADS risk category: TR3 (3 points).

ACR TI-RADS recommendations:

Given size (<1.4 cm) and appearance, this nodule does NOT meet
TI-RADS criteria for biopsy or dedicated follow-up.

_________________________________________________________
IMPRESSION: 1. Bilateral thyroid nodules.
2. Right thyroid nodule measures up to 4.3 cm and does meet criteria
for ultrasound-guided biopsy.

The above is in keeping with the ACR TI-RADS recommendations - [HOSPITAL] 3550;[DATE].

## 2020-09-22 ENCOUNTER — Other Ambulatory Visit: Payer: Self-pay

## 2020-09-22 ENCOUNTER — Telehealth: Payer: Self-pay

## 2020-09-22 MED ORDER — SULFAMETHOXAZOLE-TRIMETHOPRIM 800-160 MG PO TABS: 1.0000 | ORAL_TABLET | Freq: Two times a day (BID) | ORAL | 0 refills | Status: DC

## 2020-09-22 NOTE — Telephone Encounter (Signed)
Patient says he plans to wear the monitor but need help placing it. Advised that he can come to office tomorrow to get assistance placing monitor at 1:00 pm.

## 2020-09-22 NOTE — Telephone Encounter (Signed)
-----   Message from [name], MD sent at 09/21/2020  6:35 PM EDT ----- Bactrim DS BID for 7 days ----- Message ----- From: Dorisann Frames, RN Sent: 09/19/2020  10:15 AM EDT To: [name], MD  Please review

## 2020-09-22 NOTE — Telephone Encounter (Signed)
Prescription sent to pharmacy. Left message for patient to return call regarding urine culture.

## 2020-09-23 ENCOUNTER — Ambulatory Visit (INDEPENDENT_AMBULATORY_CARE_PROVIDER_SITE_OTHER): Payer: BC Managed Care – PPO

## 2020-09-23 DIAGNOSIS — I4891 Unspecified atrial fibrillation: Secondary | ICD-10-CM | POA: Diagnosis not present

## 2020-09-23 NOTE — Telephone Encounter (Signed)
Monitor placed.

## 2020-10-24 DIAGNOSIS — N509 Disorder of male genital organs, unspecified: Secondary | ICD-10-CM

## 2020-10-24 DIAGNOSIS — R351 Nocturia: Secondary | ICD-10-CM

## 2020-10-24 DIAGNOSIS — E876 Hypokalemia: Secondary | ICD-10-CM

## 2020-10-24 DIAGNOSIS — J1282 Pneumonia due to coronavirus disease 2019: Secondary | ICD-10-CM

## 2020-10-24 DIAGNOSIS — I10 Essential (primary) hypertension: Secondary | ICD-10-CM

## 2020-10-24 DIAGNOSIS — R7989 Other specified abnormal findings of blood chemistry: Secondary | ICD-10-CM

## 2020-10-24 DIAGNOSIS — R627 Adult failure to thrive: Secondary | ICD-10-CM

## 2020-10-24 DIAGNOSIS — I480 Paroxysmal atrial fibrillation: Secondary | ICD-10-CM

## 2020-10-24 DIAGNOSIS — E1169 Type 2 diabetes mellitus with other specified complication: Secondary | ICD-10-CM

## 2020-10-24 DIAGNOSIS — E871 Hypo-osmolality and hyponatremia: Secondary | ICD-10-CM

## 2020-10-24 DIAGNOSIS — E119 Type 2 diabetes mellitus without complications: Secondary | ICD-10-CM

## 2020-10-24 DIAGNOSIS — M170 Bilateral primary osteoarthritis of knee: Secondary | ICD-10-CM

## 2020-10-24 DIAGNOSIS — Z1211 Encounter for screening for malignant neoplasm of colon: Secondary | ICD-10-CM

## 2020-10-24 DIAGNOSIS — E8809 Other disorders of plasma-protein metabolism, not elsewhere classified: Secondary | ICD-10-CM

## 2020-10-24 DIAGNOSIS — U071 COVID-19: Secondary | ICD-10-CM

## 2020-10-24 NOTE — Patient Instructions (Signed)
Christopher Sullivan  10/24/2020     @PREFPERIOPPHARMACY @   Your procedure is scheduled on  10/27/2020.  Report to [name] at  [place]  A.M.  Call this number if you have problems the morning of surgery:  [phone]   Remember:  Do not eat or drink after midnight.                        Take these medicines the morning of surgery with A SIP OF WATER  Uroxatrol, diltiazem, metoprolol. DO NOT take any medications for diabetes the morning of your procedure.    Do not wear jewelry, make-up or nail polish.  Do not wear lotions, powders, or perfume. Please wear deodorant and brush your teeth.  Do not shave 48 hours prior to surgery.  Men may shave face and neck.  Do not bring valuables to the hospital.  [facility] is not responsible for any belongings or valuables.  Contacts, dentures or bridgework may not be worn into surgery.  Leave your suitcase in the car.  After surgery it may be brought to your room.  For patients admitted to the hospital, discharge time will be determined by your treatment team.  Patients discharged the day of surgery will not be allowed to drive home.   Name and phone number of your driver:   family Special instructions:  DO NOT smoke the morning of your procedure.  Please read over the following fact sheets that you were given. Anesthesia Post-op Instructions and Care and Recovery After Surgery       Epidermal Cyst Removal, Care After This sheet gives you information about how to care for yourself after your procedure. Your health care provider may also give you more specific instructions. If you have problems or questions, contact your health care provider. What can I expect after the procedure? After the procedure, it is common to have:  Soreness in the area where your cyst was removed.  Tightness or itchiness from the stitches (sutures) in your skin. Follow these instructions at home: Medicines  Take over-the-counter and prescription  medicines only as told by your health care provider.  If you were prescribed an antibiotic medicine or ointment, take or apply it as told by your health care provider. Do not stop using the antibiotic even if you start to feel better. Incision care   Follow instructions from your health care provider about how to take care of your incision. Make sure you: ? Wash your hands with soap and water before you change your bandage (dressing). If soap and water are not available, use hand sanitizer. ? Change your dressing as told by your health care provider. ? Leave sutures, skin glue, or adhesive strips in place. These skin closures may need to stay in place for 1-2 weeks or longer. If adhesive strip edges start to loosen and curl up, you may trim the loose edges. Do not remove adhesive strips completely unless your health care provider tells you to do that.  Keep the dressingdry until your health care provider says that it can be removed.  After your dressing is off, check your incision area every day for signs of infection. Check for: ? Redness, swelling, or pain. ? Fluid or blood. ? Warmth. ? Pus or a bad smell. General instructions  Do not take baths, swim, or use a hot tub until your health care provider approves. Ask your health care provider  if you may take showers. You may only be allowed to take sponge baths.  Your health care provider may ask you to avoid contact sports or activities that take a lot of effort. Do not do anything that stretches or puts pressure on your incision.  You can return to your normal diet.  Keep all follow-up visits as told by your health care provider. This is important. Contact a health care provider if:  You have a fever.  You have redness, swelling, or pain in the incision area.  You have fluid or blood coming from your incision.  You have pus or a bad smell coming from your incision.  Your incision feels warm to the touch.  Your cyst grows  back. Summary  After the procedure, it is common to have soreness in the area where your cyst was removed.  Take or apply over-the-counter and prescription medicines only as told by your health care provider.  Follow instructions from your health care provider about how to take care of your incision. This information is not intended to replace advice given to you by your health care provider. Make sure you discuss any questions you have with your health care provider. Document Revised: 03/04/2018 Document Reviewed: 09/05/2017 Elsevier Patient Education  2020 [employer] Anesthesia, Adult, Care After This sheet gives you information about how to care for yourself after your procedure. Your health care provider may also give you more specific instructions. If you have problems or questions, contact your health care provider. What can I expect after the procedure? After the procedure, the following side effects are common:  Pain or discomfort at the IV site.  Nausea.  Vomiting.  Sore throat.  Trouble concentrating.  Feeling cold or chills.  Weak or tired.  Sleepiness and fatigue.  Soreness and body aches. These side effects can affect parts of the body that were not involved in surgery. Follow these instructions at home:  For at least 24 hours after the procedure:  Have a responsible adult stay with you. It is important to have someone help care for you until you are awake and alert.  Rest as needed.  Do not: ? Participate in activities in which you could fall or become injured. ? Drive. ? Use heavy machinery. ? Drink alcohol. ? Take sleeping pills or medicines that cause drowsiness. ? Make important decisions or sign legal documents. ? Take care of children on your own. Eating and drinking  Follow any instructions from your health care provider about eating or drinking restrictions.  When you feel hungry, start by eating small amounts of foods that are  soft and easy to digest (bland), such as toast. Gradually return to your regular diet.  Drink enough fluid to keep your urine pale yellow.  If you vomit, rehydrate by drinking water, juice, or clear broth. General instructions  If you have sleep apnea, surgery and certain medicines can increase your risk for breathing problems. Follow instructions from your health care provider about wearing your sleep device: ? Anytime you are sleeping, including during daytime naps. ? While taking prescription pain medicines, sleeping medicines, or medicines that make you drowsy.  Return to your normal activities as told by your health care provider. Ask your health care provider what activities are safe for you.  Take over-the-counter and prescription medicines only as told by your health care provider.  If you smoke, do not smoke without supervision.  Keep all follow-up visits as told by your health  care provider. This is important. Contact a health care provider if:  You have nausea or vomiting that does not get better with medicine.  You cannot eat or drink without vomiting.  You have pain that does not get better with medicine.  You are unable to pass urine.  You develop a skin rash.  You have a fever.  You have redness around your IV site that gets worse. Get help right away if:  You have difficulty breathing.  You have chest pain.  You have blood in your urine or stool, or you vomit blood. Summary  After the procedure, it is common to have a sore throat or nausea. It is also common to feel tired.  Have a responsible adult stay with you for the first 24 hours after general anesthesia. It is important to have someone help care for you until you are awake and alert.  When you feel hungry, start by eating small amounts of foods that are soft and easy to digest (bland), such as toast. Gradually return to your regular diet.  Drink enough fluid to keep your urine pale  yellow.  Return to your normal activities as told by your health care provider. Ask your health care provider what activities are safe for you. This information is not intended to replace advice given to you by your health care provider. Make sure you discuss any questions you have with your health care provider. Document Revised: 11/15/2017 Document Reviewed: 06/28/2017 Elsevier Patient Education  [place]. How to Use Chlorhexidine for Bathing Chlorhexidine gluconate (CHG) is a germ-killing (antiseptic) solution that is used to clean the skin. It can get rid of the bacteria that normally live on the skin and can keep them away for about 24 hours. To clean your skin with CHG, you may be given:  A CHG solution to use in the shower or as part of a sponge bath.  A prepackaged cloth that contains CHG. Cleaning your skin with CHG may help lower the risk for infection:  While you are staying in the intensive care unit of the hospital.  If you have a vascular access, such as a central line, to provide short-term or long-term access to your veins.  If you have a catheter to drain urine from your bladder.  If you are on a ventilator. A ventilator is a machine that helps you breathe by moving air in and out of your lungs.  After surgery. What are the risks? Risks of using CHG include:  A skin reaction.  Hearing loss, if CHG gets in your ears.  Eye injury, if CHG gets in your eyes and is not rinsed out.  The CHG product catching fire. Make sure that you avoid smoking and flames after applying CHG to your skin. Do not use CHG:  If you have a chlorhexidine allergy or have previously reacted to chlorhexidine.  On babies younger than 21 months of age. How to use CHG solution  Use CHG only as told by your health care provider, and follow the instructions on the label.  Use the full amount of CHG as directed. Usually, this is one bottle. During a shower Follow these steps when  using CHG solution during a shower (unless your health care provider gives you different instructions): 1. Start the shower. 2. Use your normal soap and shampoo to wash your face and hair. 3. Turn off the shower or move out of the shower stream. 4. Pour the CHG onto a clean  washcloth. Do not use any type of brush or rough-edged sponge. 5. Starting at your neck, lather your body down to your toes. Make sure you follow these instructions: ? If you will be having surgery, pay special attention to the part of your body where you will be having surgery. Scrub this area for at least 1 minute. ? Do not use CHG on your head or face. If the solution gets into your ears or eyes, rinse them well with water. ? Avoid your genital area. ? Avoid any areas of skin that have broken skin, cuts, or scrapes. ? Scrub your back and under your arms. Make sure to wash skin folds. 6. Let the lather sit on your skin for 1-2 minutes or as long as told by your health care provider. 7. Thoroughly rinse your entire body in the shower. Make sure that all body creases and crevices are rinsed well. 8. Dry off with a clean towel. Do not put any substances on your body afterward--such as powder, lotion, or perfume--unless you are told to do so by your health care provider. Only use lotions that are recommended by the manufacturer. 9. Put on clean clothes or pajamas. 10. If it is the night before your surgery, sleep in clean sheets.  During a sponge bath Follow these steps when using CHG solution during a sponge bath (unless your health care provider gives you different instructions): 1. Use your normal soap and shampoo to wash your face and hair. 2. Pour the CHG onto a clean washcloth. 3. Starting at your neck, lather your body down to your toes. Make sure you follow these instructions: ? If you will be having surgery, pay special attention to the part of your body where you will be having surgery. Scrub this area for at least 1  minute. ? Do not use CHG on your head or face. If the solution gets into your ears or eyes, rinse them well with water. ? Avoid your genital area. ? Avoid any areas of skin that have broken skin, cuts, or scrapes. ? Scrub your back and under your arms. Make sure to wash skin folds. 4. Let the lather sit on your skin for 1-2 minutes or as long as told by your health care provider. 5. Using a different clean, wet washcloth, thoroughly rinse your entire body. Make sure that all body creases and crevices are rinsed well. 6. Dry off with a clean towel. Do not put any substances on your body afterward--such as powder, lotion, or perfume--unless you are told to do so by your health care provider. Only use lotions that are recommended by the manufacturer. 7. Put on clean clothes or pajamas. 8. If it is the night before your surgery, sleep in clean sheets. How to use CHG prepackaged cloths  Only use CHG cloths as told by your health care provider, and follow the instructions on the label.  Use the CHG cloth on clean, dry skin.  Do not use the CHG cloth on your head or face unless your health care provider tells you to.  When washing with the CHG cloth: ? Avoid your genital area. ? Avoid any areas of skin that have broken skin, cuts, or scrapes. Before surgery Follow these steps when using a CHG cloth to clean before surgery (unless your health care provider gives you different instructions): 1. Using the CHG cloth, vigorously scrub the part of your body where you will be having surgery. Scrub using a back-and-forth motion for 3  minutes. The area on your body should be completely wet with CHG when you are done scrubbing. 2. Do not rinse. Discard the cloth and let the area air-dry. Do not put any substances on the area afterward, such as powder, lotion, or perfume. 3. Put on clean clothes or pajamas. 4. If it is the night before your surgery, sleep in clean sheets.  For general bathing Follow these  steps when using CHG cloths for general bathing (unless your health care provider gives you different instructions). 1. Use a separate CHG cloth for each area of your body. Make sure you wash between any folds of skin and between your fingers and toes. Wash your body in the following order, switching to a new cloth after each step: ? The front of your neck, shoulders, and chest. ? Both of your arms, under your arms, and your hands. ? Your stomach and groin area, avoiding the genitals. ? Your right leg and foot. ? Your left leg and foot. ? The back of your neck, your back, and your buttocks. 2. Do not rinse. Discard the cloth and let the area air-dry. Do not put any substances on your body afterward--such as powder, lotion, or perfume--unless you are told to do so by your health care provider. Only use lotions that are recommended by the manufacturer. 3. Put on clean clothes or pajamas. Contact a health care provider if:  Your skin gets irritated after scrubbing.  You have questions about using your solution or cloth. Get help right away if:  Your eyes become very red or swollen.  Your eyes itch badly.  Your skin itches badly and is red or swollen.  Your hearing changes.  You have trouble seeing.  You have swelling or tingling in your mouth or throat.  You have trouble breathing.  You swallow any chlorhexidine. Summary  Chlorhexidine gluconate (CHG) is a germ-killing (antiseptic) solution that is used to clean the skin. Cleaning your skin with CHG may help to lower your risk for infection.  You may be given CHG to use for bathing. It may be in a bottle or in a prepackaged cloth to use on your skin. Carefully follow your health care provider's instructions and the instructions on the product label.  Do not use CHG if you have a chlorhexidine allergy.  Contact your health care provider if your skin gets irritated after scrubbing. This information is not intended to replace  advice given to you by your health care provider. Make sure you discuss any questions you have with your health care provider. Document Revised: 01/29/2019 Document Reviewed: 10/10/2017 Elsevier Patient Education  [place].

## 2020-10-25 ENCOUNTER — Encounter (HOSPITAL_COMMUNITY)
Admission: RE | Admit: 2020-10-25 | Discharge: 2020-10-25 | Disposition: A | Discharge status: Home / Self Care | Payer: BC Managed Care – PPO | Source: Ambulatory Visit | Attending: Urology | Admitting: Urology

## 2020-10-25 ENCOUNTER — Encounter (HOSPITAL_COMMUNITY): Payer: Self-pay

## 2020-10-25 ENCOUNTER — Telehealth: Payer: Self-pay

## 2020-10-25 ENCOUNTER — Other Ambulatory Visit (HOSPITAL_COMMUNITY): Payer: BC Managed Care – PPO

## 2020-10-25 NOTE — Telephone Encounter (Signed)
Message received that patient needs to post pone his surgery for Dec 2nd until his Medicare become active. Pt will call back when his insurance is good to reschedule surgery.

## 2020-10-26 ENCOUNTER — Telehealth: Payer: Self-pay | Admitting: Family Medicine

## 2020-10-26 DIAGNOSIS — I10 Essential (primary) hypertension: Secondary | ICD-10-CM | POA: Diagnosis not present

## 2020-10-26 DIAGNOSIS — Z23 Encounter for immunization: Secondary | ICD-10-CM | POA: Diagnosis not present

## 2020-10-26 DIAGNOSIS — E1169 Type 2 diabetes mellitus with other specified complication: Secondary | ICD-10-CM | POA: Diagnosis not present

## 2020-10-26 DIAGNOSIS — E782 Mixed hyperlipidemia: Secondary | ICD-10-CM | POA: Diagnosis not present

## 2020-10-26 NOTE — Telephone Encounter (Signed)
Dr. [name] office called requesting a copy of recent heart monitor be faxed to her office. [phone]   (office) [phone]. Patient would also like to be notified of results.

## 2020-10-26 NOTE — Telephone Encounter (Signed)
Study Highlights  11 day monitor Min HR 57, Max HR 182, Avg HR 100 Rare supraventricular ectopy in the form of isolated PACs, couplets, triplets. Short runs of SVT up to 10 beats Occasional ventricular ectopy in the form of isolated PVCs(4.5%), couplets, triplets. Short runs of NSVT up to 7 beats No diary submitted   The monitor results report he had rare PAC's . Short runs of SVT the longest was 10 beats. Occasional PVC's, Short runs of NSVT up to 7 beats.

## 2020-10-27 ENCOUNTER — Encounter (HOSPITAL_COMMUNITY): Admission: RE | Payer: Self-pay | Source: Home / Self Care

## 2020-10-27 ENCOUNTER — Ambulatory Visit (HOSPITAL_COMMUNITY): Admission: RE | Admit: 2020-10-27 | Payer: BC Managed Care – PPO | Source: Home / Self Care | Admitting: Urology

## 2020-10-27 DIAGNOSIS — Z1211 Encounter for screening for malignant neoplasm of colon: Secondary | ICD-10-CM

## 2020-10-27 DIAGNOSIS — E8809 Other disorders of plasma-protein metabolism, not elsewhere classified: Secondary | ICD-10-CM

## 2020-10-27 DIAGNOSIS — M170 Bilateral primary osteoarthritis of knee: Secondary | ICD-10-CM

## 2020-10-27 DIAGNOSIS — N509 Disorder of male genital organs, unspecified: Secondary | ICD-10-CM

## 2020-10-27 DIAGNOSIS — E871 Hypo-osmolality and hyponatremia: Secondary | ICD-10-CM

## 2020-10-27 DIAGNOSIS — R7989 Other specified abnormal findings of blood chemistry: Secondary | ICD-10-CM

## 2020-10-27 DIAGNOSIS — E876 Hypokalemia: Secondary | ICD-10-CM

## 2020-10-27 DIAGNOSIS — R351 Nocturia: Secondary | ICD-10-CM

## 2020-10-27 DIAGNOSIS — E119 Type 2 diabetes mellitus without complications: Secondary | ICD-10-CM

## 2020-10-27 DIAGNOSIS — I480 Paroxysmal atrial fibrillation: Secondary | ICD-10-CM

## 2020-10-27 DIAGNOSIS — R627 Adult failure to thrive: Secondary | ICD-10-CM

## 2020-10-27 DIAGNOSIS — U071 COVID-19: Secondary | ICD-10-CM

## 2020-10-27 DIAGNOSIS — E1169 Type 2 diabetes mellitus with other specified complication: Secondary | ICD-10-CM

## 2020-10-27 DIAGNOSIS — I10 Essential (primary) hypertension: Secondary | ICD-10-CM

## 2020-10-27 SURGERY — EXPLORATION, SCROTUM
Anesthesia: General | Laterality: Left

## 2020-10-31 NOTE — Telephone Encounter (Signed)
Patient's wife informed. Copy sent to PCP 

## 2020-11-07 ENCOUNTER — Ambulatory Visit: Payer: BC Managed Care – PPO | Admitting: Urology

## 2021-02-02 ENCOUNTER — Ambulatory Visit: Payer: BC Managed Care – PPO | Admitting: Cardiology

## 2021-02-02 NOTE — Progress Notes (Deleted)
Clinical Summary Mr. Likes is a 65 y.o.male  seen for follow up  1. Irregular heart beat/PVCs - evaluated in 2018, holter with low burden - 09/2017 holter monitor: Occasional PVCs were noted with rare couplets and bigeminy (approximately 3% of total beats). Rare PACs noted. - no recent symptoms    2. COVID pneumonia - admitted Jan 2021 - breathing is improving, ongoing fatigue    3. Afib - new diagnosis during Jan 2021 admission with COVID - started on eliquis - has been on dilt 120, toprol 25 for rate control  - denies any palpitations. Compliant with meds  08/2020 event monitor PACs, PVCs, shorts runs of SVT and NSVT. No afib    Past Medical History:  Diagnosis Date  . Bilateral primary osteoarthritis of knee   . Diabetes mellitus without complication ([place])   . GERD (gastroesophageal reflux disease)   . Hypercholesteremia   . Hyperlipidemia   . Hypertension      Allergies  Allergen Reactions  . Lisinopril Swelling     Current Outpatient Medications  Medication Sig Dispense Refill  . alfuzosin (UROXATRAL) 10 MG 24 hr tablet Take 1 tablet (10 mg total) by mouth daily with breakfast. 30 tablet 11  . apixaban (ELIQUIS) 5 MG TABS tablet Take 1 tablet (5 mg total) by mouth 2 (two) times daily. 60 tablet 3  . atorvastatin (LIPITOR) 20 MG tablet Take 1 tablet (20 mg total) by mouth daily. 90 tablet 3  . clotrimazole-betamethasone (LOTRISONE) cream Apply 1 application topically 2 (two) times daily. (Patient not taking: Reported on 10/24/2020) 30 g 3  . diltiazem (CARDIZEM CD) 120 MG 24 hr capsule Take 1 capsule (120 mg total) by mouth daily. 60 capsule 0  . glipiZIDE (GLUCOTROL XL) 10 MG 24 hr tablet Take 1 tablet (10 mg total) by mouth daily with breakfast. 90 tablet 1  . metFORMIN (GLUCOPHAGE) 1000 MG tablet Take 1,000 mg by mouth 2 (two) times daily with a meal.     . metoprolol succinate (TOPROL-XL) 25 MG 24 hr tablet Take 1 tablet (25 mg total) by  mouth daily. 90 tablet 3  . sulfamethoxazole-trimethoprim (BACTRIM DS) 800-160 MG tablet Take 1 tablet by mouth every 12 (twelve) hours. (Patient not taking: Reported on 10/24/2020) 14 tablet 0  . zinc sulfate 220 (50 Zn) MG capsule Take 1 capsule (220 mg total) by mouth daily. (Patient not taking: Reported on 10/24/2020) 30 capsule 0   No current facility-administered medications for this visit.     Past Surgical History:  Procedure Laterality Date  . CATARACT EXTRACTION    . None       Allergies  Allergen Reactions  . Lisinopril Swelling      Family History  Problem Relation Age of Onset  . Hypertension Mother   . Diabetes Mother   . Arthritis Mother   . Heart disease Mother   . Miscarriages / [place] Mother   . Stroke Mother   . Depression Father   . Mental illness Father   . Lupus Sister   . Diabetes Daughter   . Hypertension Daughter   . Colon cancer Neg Hx   . Colon polyps Neg Hx      Social History Mr. Hathcoat reports that he has never smoked. He has never used smokeless tobacco. Mr. Dimarco reports no history of alcohol use.   Review of Systems CONSTITUTIONAL: No weight loss, fever, chills, weakness or fatigue.  HEENT: Eyes: No visual loss, blurred vision, double  vision or yellow sclerae.No hearing loss, sneezing, congestion, runny nose or sore throat.  SKIN: No rash or itching.  CARDIOVASCULAR:  RESPIRATORY: No shortness of breath, cough or sputum.  GASTROINTESTINAL: No anorexia, nausea, vomiting or diarrhea. No abdominal pain or blood.  GENITOURINARY: No burning on urination, no polyuria NEUROLOGICAL: No headache, dizziness, syncope, paralysis, ataxia, numbness or tingling in the extremities. No change in bowel or bladder control.  MUSCULOSKELETAL: No muscle, back pain, joint pain or stiffness.  LYMPHATICS: No enlarged nodes. No history of splenectomy.  PSYCHIATRIC: No history of depression or anxiety.  ENDOCRINOLOGIC: No reports of sweating, cold or  heat intolerance. No polyuria or polydipsia.  Marland Kitchen   Physical Examination There were no vitals filed for this visit. There were no vitals filed for this visit.  Gen: resting comfortably, no acute distress HEENT: no scleral icterus, pupils equal round and reactive, no palptable cervical adenopathy,  CV Resp: Clear to auscultation bilaterally GI: abdomen is soft, non-tender, non-distended, normal bowel sounds, no hepatosplenomegaly MSK: extremities are warm, no edema.  Skin: warm, no rash Neuro:  no focal deficits Psych: appropriate affect   Diagnostic Studies 09/2017 holter 24-hour Holter monitor reviewed. Sinus rhythm is present throughout. Heart rate ranged from 58 bpm up to 111 bpm with average heart rate 79 bpm. There were no significant pauses. Occasional PVCs were noted with rare couplets and bigeminy (approximately 3% of total beats). Rare PACs noted.  Jan 2021 echo IMPRESSIONS   1. Left ventricular ejection fraction, by visual estimation, is 55 to 60%. The left ventricle has normal function. Left ventricular septal wall thickness was normal. Normal left ventricular posterior wall thickness. There is no left ventricular  hypertrophy. 2. Left ventricular diastolic function could not be evaluated. 3. Mildly dilated left ventricular internal cavity size. 4. The left ventricle has no regional wall motion abnormalities. 5. Mild hypokinesis of the basal inferior myocardium. 6. Global right ventricle has normal systolic function.The right ventricular size is normal. No increase in right ventricular wall thickness. 7. Left atrial size was mildly dilated. 8. Right atrial size was normal. 9. Mild mitral annular calcification. 10. The mitral valve is normal in structure. Trivial mitral valve regurgitation. No evidence of mitral stenosis. 11. The tricuspid valve is normal in structure. 12. The aortic valve is tricuspid. Aortic valve regurgitation is not visualized. No  evidence of aortic valve sclerosis or stenosis. 13. The pulmonic valve was normal in structure. Pulmonic valve regurgitation is not visualized. 14. Normal pulmonary artery systolic pressure. 15. The inferior vena cava is normal in size with greater than 50% respiratory variability, suggesting right atrial pressure of 3 mmHg.  08/2020 event monitor  11 day monitor  Min HR 57, Max HR 182, Avg HR 100  Rare supraventricular ectopy in the form of isolated PACs, couplets, triplets. Short runs of SVT up to 10 beats  Occasional ventricular ectopy in the form of isolated PVCs(4.5%), couplets, triplets. Short runs of NSVT up to 7 beats  No diary submitted   Jan 2021 echo 1. Left ventricular ejection fraction, by visual estimation, is 55 to  60%. The left ventricle has normal function. Left ventricular septal wall  thickness was normal. Normal left ventricular posterior wall thickness.  There is no left ventricular  hypertrophy.  2. Left ventricular diastolic function could not be evaluated.  3. Mildly dilated left ventricular internal cavity size.  4. The left ventricle has no regional wall motion abnormalities.  5. Mild hypokinesis of the basal inferior myocardium.  6.  Global right ventricle has normal systolic function.The right  ventricular size is normal. No increase in right ventricular wall  thickness.  7. Left atrial size was mildly dilated.  8. Right atrial size was normal.  9. Mild mitral annular calcification.  10. The mitral valve is normal in structure. Trivial mitral valve  regurgitation. No evidence of mitral stenosis.  11. The tricuspid valve is normal in structure.  12. The aortic valve is tricuspid. Aortic valve regurgitation is not  visualized. No evidence of aortic valve sclerosis or stenosis.  13. The pulmonic valve was normal in structure. Pulmonic valve  regurgitation is not visualized.  14. Normal pulmonary artery systolic pressure.  15. The inferior  vena cava is normal in size with greater than 50%  respiratory variability, suggesting right atrial pressure of 3 mmHg.     Assessment and Plan  1. Afib - new diagnosis in setting of COVID pneumonia. Unclear if isolated in setting of COVID or ongoing afib - will arrange nursing visit for EKG and vitals, if back in [place] would plan for event monitor - continue toprol, dilt, eliquis at this time      Arnoldo Lenis, M.D., F.A.C.C.

## 2021-02-10 DIAGNOSIS — L84 Corns and callosities: Secondary | ICD-10-CM | POA: Diagnosis not present

## 2021-02-10 DIAGNOSIS — M21962 Unspecified acquired deformity of left lower leg: Secondary | ICD-10-CM | POA: Diagnosis not present

## 2021-02-10 DIAGNOSIS — L2084 Intrinsic (allergic) eczema: Secondary | ICD-10-CM | POA: Diagnosis not present

## 2021-02-10 DIAGNOSIS — I10 Essential (primary) hypertension: Secondary | ICD-10-CM | POA: Diagnosis not present

## 2021-02-10 DIAGNOSIS — E1169 Type 2 diabetes mellitus with other specified complication: Secondary | ICD-10-CM | POA: Diagnosis not present

## 2021-02-10 DIAGNOSIS — I4891 Unspecified atrial fibrillation: Secondary | ICD-10-CM | POA: Diagnosis not present

## 2021-02-10 DIAGNOSIS — E782 Mixed hyperlipidemia: Secondary | ICD-10-CM | POA: Diagnosis not present

## 2021-02-10 DIAGNOSIS — M21961 Unspecified acquired deformity of right lower leg: Secondary | ICD-10-CM | POA: Diagnosis not present

## 2021-02-27 ENCOUNTER — Other Ambulatory Visit: Payer: Self-pay

## 2021-02-27 ENCOUNTER — Ambulatory Visit (INDEPENDENT_AMBULATORY_CARE_PROVIDER_SITE_OTHER): Payer: Medicare Other | Admitting: Podiatry

## 2021-02-27 DIAGNOSIS — L84 Corns and callosities: Secondary | ICD-10-CM

## 2021-02-27 DIAGNOSIS — B351 Tinea unguium: Secondary | ICD-10-CM | POA: Diagnosis not present

## 2021-02-27 DIAGNOSIS — M2041 Other hammer toe(s) (acquired), right foot: Secondary | ICD-10-CM

## 2021-02-27 DIAGNOSIS — M79675 Pain in left toe(s): Secondary | ICD-10-CM

## 2021-02-27 DIAGNOSIS — M2042 Other hammer toe(s) (acquired), left foot: Secondary | ICD-10-CM

## 2021-02-27 DIAGNOSIS — M79674 Pain in right toe(s): Secondary | ICD-10-CM | POA: Diagnosis not present

## 2021-02-27 DIAGNOSIS — E1142 Type 2 diabetes mellitus with diabetic polyneuropathy: Secondary | ICD-10-CM

## 2021-02-27 DIAGNOSIS — M2142 Flat foot [pes planus] (acquired), left foot: Secondary | ICD-10-CM

## 2021-02-27 DIAGNOSIS — M2141 Flat foot [pes planus] (acquired), right foot: Secondary | ICD-10-CM

## 2021-02-27 NOTE — Progress Notes (Signed)
History of Present Illness: Here for followup of scrotal lesion.  He was scheduled to have this excised by Dr. Alyson Ingles in late 2021 but because of work had to cancel.  He would like to have that rescheduled.  He also has significant problems retracting his foreskin.  This is quite difficult and uncomfortable for him.  He would like to consider circumcision.  He is diabetic.    Past Medical History:  Diagnosis Date  . Bilateral primary osteoarthritis of knee   . Diabetes mellitus without complication ([place])   . GERD (gastroesophageal reflux disease)   . Hypercholesteremia   . Hyperlipidemia   . Hypertension     Past Surgical History:  Procedure Laterality Date  . CATARACT EXTRACTION    . None      Home Medications:  Allergies as of 02/28/2021      Reactions   Lisinopril Swelling      Medication List       Accurate as of February 27, 2021  8:46 PM. If you have any questions, ask your nurse or doctor.        alfuzosin 10 MG 24 hr tablet Commonly known as: UROXATRAL Take 1 tablet (10 mg total) by mouth daily with breakfast.   apixaban 5 MG Tabs tablet Commonly known as: ELIQUIS Take 1 tablet (5 mg total) by mouth 2 (two) times daily.   atorvastatin 20 MG tablet Commonly known as: LIPITOR Take 1 tablet (20 mg total) by mouth daily.   clotrimazole-betamethasone cream Commonly known as: Lotrisone Apply 1 application topically 2 (two) times daily.   diltiazem 120 MG 24 hr capsule Commonly known as: Cardizem CD Take 1 capsule (120 mg total) by mouth daily.   glipiZIDE 10 MG 24 hr tablet Commonly known as: Glucotrol XL Take 1 tablet (10 mg total) by mouth daily with breakfast.   metFORMIN 1000 MG tablet Commonly known as: GLUCOPHAGE Take 1,000 mg by mouth 2 (two) times daily with a meal.   metoprolol succinate 25 MG 24 hr tablet Commonly known as: TOPROL-XL Take 1 tablet (25 mg total) by mouth daily.   sulfamethoxazole-trimethoprim 800-160 MG tablet Commonly known  as: BACTRIM DS Take 1 tablet by mouth every 12 (twelve) hours.   zinc sulfate 220 (50 Zn) MG capsule Take 1 capsule (220 mg total) by mouth daily.       Allergies:  Allergies  Allergen Reactions  . Lisinopril Swelling    Family History  Problem Relation Age of Onset  . Hypertension Mother   . Diabetes Mother   . Arthritis Mother   . Heart disease Mother   . Miscarriages / [place] Mother   . Stroke Mother   . Depression Father   . Mental illness Father   . Lupus Sister   . Diabetes Daughter   . Hypertension Daughter   . Colon cancer Neg Hx   . Colon polyps Neg Hx     Social History:  reports that he has never smoked. He has never used smokeless tobacco. He reports that he does not drink alcohol and does not use drugs.  ROS: A complete review of systems was performed.  All systems are negative except for pertinent findings as noted.  Physical Exam:  Vital signs in last 24 hours: There were no vitals taken for this visit. Constitutional:  Alert and oriented, No acute distress Cardiovascular: Regular rate  Respiratory: Normal respiratory effort GI: Abdomen is soft, nontender, nondistended, no abdominal masses. No CVAT.  Genitourinary: Phallus uncircumcised.  Significant phimosis and depigmentation of his foreskin.  2 cm exophytic papillary lesion in his midline posterior scrotum. Neurologic: Grossly intact, no focal deficits Psychiatric: Normal mood and affect  I have reviewed prior pt notes  I have reviewed notes from referring/previous physicians  I have reviewed urinalysis results      Impression/Assessment:  1.  Phimosis, significant  2.  Scrotal lesion, patient desires this to be removed  Plan:  I discussed proceeding with excision of skin lesion as well as circumcision with the patient.  He will need to come off of his Eliquis before this so we will get clearance from Dr. Harl Bowie.  The patient has seen Dr. Alyson Ingles before and I will schedule this  to be done here in [place] by Dr. Alyson Ingles.

## 2021-02-28 ENCOUNTER — Other Ambulatory Visit: Payer: Self-pay

## 2021-02-28 ENCOUNTER — Ambulatory Visit (INDEPENDENT_AMBULATORY_CARE_PROVIDER_SITE_OTHER): Payer: Medicare Other | Admitting: Urology

## 2021-02-28 ENCOUNTER — Encounter: Payer: Self-pay | Admitting: Urology

## 2021-02-28 VITALS — BP 130/83 | HR 103 | Temp 98.6°F | Wt 234.0 lb

## 2021-02-28 DIAGNOSIS — N400 Enlarged prostate without lower urinary tract symptoms: Secondary | ICD-10-CM | POA: Diagnosis not present

## 2021-02-28 DIAGNOSIS — R351 Nocturia: Secondary | ICD-10-CM

## 2021-02-28 DIAGNOSIS — N509 Disorder of male genital organs, unspecified: Secondary | ICD-10-CM

## 2021-02-28 DIAGNOSIS — N471 Phimosis: Secondary | ICD-10-CM | POA: Diagnosis not present

## 2021-02-28 LAB — MICROSCOPIC EXAMINATION
Renal Epithel, UA: NONE SEEN /hpf
WBC, UA: >30 /hpf — AB (ref 0–5)

## 2021-02-28 LAB — URINALYSIS, ROUTINE W REFLEX MICROSCOPIC
Bilirubin, UA: NEGATIVE
Ketones, UA: NEGATIVE
Nitrite, UA: NEGATIVE
Protein,UA: NEGATIVE
Specific Gravity, UA: 1.015 (ref 1.005–1.030)
Urobilinogen, Ur: 0.2 mg/dL (ref 0.2–1.0)
pH, UA: 6 (ref 5.0–7.5)

## 2021-02-28 NOTE — Progress Notes (Signed)
Urological Symptom Review  Patient is experiencing the following symptoms: Hard to postpone urination Get up at night to urinate Leakage of urine   Review of Systems  Gastrointestinal (upper)  : Indigestion/heartburn  Gastrointestinal (lower) : Negative for lower GI symptoms  Constitutional : Negative for symptoms  Skin: Skin rash/lesion Itching  Eyes: Negative for eye symptoms  Ear/Nose/Throat : Negative for Ear/Nose/Throat symptoms  Hematologic/Lymphatic: Negative for Hematologic/Lymphatic symptoms  Cardiovascular : Leg swelling  Respiratory : Negative for respiratory symptoms  Endocrine: Excessive thirst  Musculoskeletal: Joint pain  Neurological: Negative for neurological symptoms  Psychologic: Negative for psychiatric symptoms

## 2021-02-28 NOTE — Addendum Note (Signed)
Addended by: Valentina Lucks on: 02/28/2021 01:39 PM   Modules accepted: Orders

## 2021-03-01 NOTE — Progress Notes (Addendum)
  Subjective:  Patient ID: Christopher Sullivan, male    DOB: Aug 13, 1956,  MRN: [id number]  Chief Complaint  Patient presents with  . Nail Problem    Thick painful toenails  . Diabetes    A1C   9.2  . Callouses    Pre-ulcerative callus     65 y.o. male presents with the above complaint. History confirmed with patient. Diabetic for many years, not on insulin. Last A1c 9.2%. C/O numbness and tingling in both feet.  Doing well and no new issues  Objective:  Physical Exam: warm, good capillary refill, DP reduced bilateral, no trophic changes or ulcerative lesions, PT reduced bilateral and reduced sensation at plantar soles and toe pulps. Left Foot: submet callus 5th head and base, mycotic thick crumbling toenails present 1-5  Right submet callus 5th head and base, mycotic thick crumbling toenails present 1-5Foot:    Assessment:   1. Pain due to onychomycosis of toenails of both feet   2. Type 2 diabetes mellitus with diabetic polyneuropathy, without long-term current use of insulin (HCC)   3. Callus of foot   4. Pes planus of both feet   5. Hammertoe of left foot   6. Hammertoe of right foot      Plan:  Patient was evaluated and treated and all questions answered.   Patient educated on diabetes. Discussed proper diabetic foot care and discussed risks and complications of disease. Educated patient in depth on reasons to return to the office immediately should he/she discover anything concerning or new on the feet. All questions answered. Discussed proper shoes as well.  We will schedule him for diabetic shoe fitting  Discussed the etiology and treatment options for the condition in detail with the patient. Educated patient on the topical and oral treatment options for mycotic nails. Recommended debridement of the nails today. Sharp and mechanical debridement performed of all painful and mycotic nails today. Nails debrided in length and thickness using a nail nipper and a mechanical burr to  level of comfort. Discussed treatment options including appropriate shoe gear. Follow up as needed for painful nails.  All symptomatic hyperkeratoses were safely debrided with a sterile #15 blade to patient's level of comfort without incident. We discussed preventative and palliative care of these lesions including supportive and accommodative shoegear, padding, prefabricated and custom molded accommodative orthoses, use of a pumice stone and lotions/creams daily.   Return if symptoms worsen or fail to improve, for at risk diabetic foot care.   Lanae Crumbly, DPM 03/01/2021

## 2021-03-08 ENCOUNTER — Telehealth: Payer: Self-pay | Admitting: Podiatry

## 2021-03-08 NOTE — Telephone Encounter (Signed)
LVM for patient to return call to diabetic shoe casting scheduled

## 2021-03-14 ENCOUNTER — Telehealth: Payer: Self-pay

## 2021-03-14 ENCOUNTER — Other Ambulatory Visit: Payer: Self-pay

## 2021-03-14 ENCOUNTER — Ambulatory Visit (INDEPENDENT_AMBULATORY_CARE_PROVIDER_SITE_OTHER): Payer: Medicare Other | Admitting: Podiatry

## 2021-03-14 DIAGNOSIS — M2141 Flat foot [pes planus] (acquired), right foot: Secondary | ICD-10-CM

## 2021-03-14 DIAGNOSIS — E1142 Type 2 diabetes mellitus with diabetic polyneuropathy: Secondary | ICD-10-CM

## 2021-03-14 DIAGNOSIS — M2142 Flat foot [pes planus] (acquired), left foot: Secondary | ICD-10-CM

## 2021-03-14 NOTE — Telephone Encounter (Signed)
Left message to return call to office to confirm surgery date with patient for scheduling.

## 2021-03-14 NOTE — Telephone Encounter (Signed)
Patient's wife calling to schedule surgery needed.  Please call back at [phone].  Thanks, Helene Kelp

## 2021-03-14 NOTE — Progress Notes (Signed)
Patient presented for foam casting for 3 pair custom diabetic shoe inserts. Patient is measured with a [place] Device to be a size 12 wide  Diabetic shoes are chosen from the [employer] catalog.   The shoes are chosen are [phone]  The patient will be contacted when the shoes and inserts are ready to be picked up

## 2021-03-15 ENCOUNTER — Telehealth: Payer: Self-pay | Admitting: Cardiology

## 2021-03-15 NOTE — Telephone Encounter (Signed)
Patient with diagnosis of afib on Eliquis for anticoagulation.    Procedure: Excision of scrotal lesion and circumcision procedure  Date of procedure: 04/13/21  CHA2DS2-VASc Score = 3  This indicates a 3.2% annual risk of stroke. The patient's score is based upon: CHF History: No HTN History: Yes Diabetes History: Yes Stroke History: No Vascular Disease History: No Age Score: 1 Gender Score: 0   CrCl >142m/min Platelet count 163K  Per office protocol, patient can hold Eliquis for 2 days prior to and after procedure as requested since his cardiovascular risk is relatively low.

## 2021-03-15 NOTE — Telephone Encounter (Signed)
I spoke with wife and surgery date of May 19th given to wife.

## 2021-03-15 NOTE — Telephone Encounter (Signed)
Clinical pharmacist to review Eliquis 

## 2021-03-15 NOTE — Telephone Encounter (Signed)
Upcoming visit with Dr. Harl Bowie on 4/27 for final cardiac clearance.  Will defer to MD and remove from pool

## 2021-03-15 NOTE — Telephone Encounter (Signed)
   [facility] Pre-operative Risk Assessment    Patient Name: Christopher Sullivan  DOB: [birth year]  MRN: [id number]   HEARTCARE STAFF: - Please ensure there is not already an duplicate clearance open for this procedure. - Under Visit Info/Reason for Call, type in Other and utilize the format Clearance MM/DD/YY or Clearance TBD. Do not use dashes or single digits. - If request is for dental extraction, please clarify the # of teeth to be extracted.  Request for surgical clearance:  1. What type of surgery is being performed? Excision of scrotal lesion and circumcision procedure  2. When is this surgery scheduled? 04/13/21  What type of clearance is required (medical clearance vs. Pharmacy clearance to hold med vs. Both)? both 3. Are there any medications that need to be held prior to surgery and how long?Eliquis 2 days before and 2 days after   4. Practice name and name of physician performing surgery? Dr Nicolette Bang  5. What is the office phone number? [phone]   7.   What is the office fax number? [phone]  8.   Anesthesia type (None, local, MAC, general) ? General anesthesia   Jannet Askew 03/15/2021, 2:02 PM  _________________________________________________________________   (provider comments below)

## 2021-03-15 NOTE — Progress Notes (Signed)
Surgical clearance letter sent to Dr. Harl Bowie. Patient has appt on 04/27

## 2021-03-22 ENCOUNTER — Ambulatory Visit (INDEPENDENT_AMBULATORY_CARE_PROVIDER_SITE_OTHER): Payer: Medicare Other | Admitting: Cardiology

## 2021-03-22 ENCOUNTER — Encounter: Payer: Self-pay | Admitting: Cardiology

## 2021-03-22 VITALS — BP 130/70 | HR 66 | Ht 72.0 in | Wt 233.6 lb

## 2021-03-22 DIAGNOSIS — I493 Ventricular premature depolarization: Secondary | ICD-10-CM | POA: Diagnosis not present

## 2021-03-22 DIAGNOSIS — I4891 Unspecified atrial fibrillation: Secondary | ICD-10-CM | POA: Diagnosis not present

## 2021-03-22 DIAGNOSIS — Z01810 Encounter for preprocedural cardiovascular examination: Secondary | ICD-10-CM | POA: Diagnosis not present

## 2021-03-22 NOTE — Patient Instructions (Signed)
Your physician recommends that you schedule a follow-up appointment in: [place] has recommended you make the following change in your medication:   STOP ELIQUIS  Thank you for choosing [facility]!!

## 2021-03-22 NOTE — Progress Notes (Signed)
Clinical Summary Christopher Sullivan is a 65 y.o.male seen for follow up  1. Irregular heart beat/PVCs - evaluated in 2018, holter with low burden - 09/2017 holter monitor: Occasional PVCs were noted with rare couplets and bigeminy (approximately 3% of total beats). Rare PACs noted.  - no recent palpitations     2. COVID pneumonia - admitted Jan 2021    3. Afib - new diagnosis during Jan 2021 admission with COVID - started on eliquis - has been on dilt 120, toprol 25 for rate control  - denies any palpitations. Compliant with meds  09/2020 monitor: PACs, short runs SVT, short runs NSVT. No afib - no recent palpitations    4. Preop scrotal lesion - sedentary lifestyle, limited by chronic knee pain - tolerates greater 4 METs without symptoms    Past Medical History:  Diagnosis Date  . Bilateral primary osteoarthritis of knee   . Diabetes mellitus without complication ([place])   . GERD (gastroesophageal reflux disease)   . Hypercholesteremia   . Hyperlipidemia   . Hypertension      Allergies  Allergen Reactions  . Lisinopril Swelling     Current Outpatient Medications  Medication Sig Dispense Refill  . alfuzosin (UROXATRAL) 10 MG 24 hr tablet Take 1 tablet (10 mg total) by mouth daily with breakfast. 30 tablet 11  . apixaban (ELIQUIS) 5 MG TABS tablet Take 1 tablet (5 mg total) by mouth 2 (two) times daily. 60 tablet 3  . atorvastatin (LIPITOR) 20 MG tablet Take 1 tablet (20 mg total) by mouth daily. 90 tablet 3  . clotrimazole-betamethasone (LOTRISONE) cream Apply 1 application topically 2 (two) times daily. 30 g 3  . diltiazem (CARDIZEM CD) 120 MG 24 hr capsule Take 1 capsule (120 mg total) by mouth daily. 60 capsule 0  . glipiZIDE (GLUCOTROL XL) 10 MG 24 hr tablet Take 1 tablet (10 mg total) by mouth daily with breakfast. 90 tablet 1  . hydrochlorothiazide (HYDRODIURIL) 25 MG tablet 1 tablet    . metFORMIN (GLUCOPHAGE) 1000 MG tablet Take 1,000 mg by  mouth 2 (two) times daily with a meal.     . metoprolol succinate (TOPROL-XL) 25 MG 24 hr tablet Take 1 tablet (25 mg total) by mouth daily. 90 tablet 3  . sitaGLIPtin (JANUVIA) 100 MG tablet 1 tablet    . sulfamethoxazole-trimethoprim (BACTRIM DS) 800-160 MG tablet Take 1 tablet by mouth every 12 (twelve) hours. 14 tablet 0  . triamcinolone (KENALOG) 0.1 % 1 application    . zinc sulfate 220 (50 Zn) MG capsule Take 1 capsule (220 mg total) by mouth daily. 30 capsule 0   No current facility-administered medications for this visit.     Past Surgical History:  Procedure Laterality Date  . CATARACT EXTRACTION    . None       Allergies  Allergen Reactions  . Lisinopril Swelling      Family History  Problem Relation Age of Onset  . Hypertension Mother   . Diabetes Mother   . Arthritis Mother   . Heart disease Mother   . Miscarriages / [place] Mother   . Stroke Mother   . Depression Father   . Mental illness Father   . Lupus Sister   . Diabetes Daughter   . Hypertension Daughter   . Colon cancer Neg Hx   . Colon polyps Neg Hx      Social History Christopher Sullivan reports that he has never smoked. He has never used  smokeless tobacco. Christopher Sullivan reports no history of alcohol use.   Review of Systems CONSTITUTIONAL: No weight loss, fever, chills, weakness or fatigue.  HEENT: Eyes: No visual loss, blurred vision, double vision or yellow sclerae.No hearing loss, sneezing, congestion, runny nose or sore throat.  SKIN: No rash or itching.  CARDIOVASCULAR: per hpi RESPIRATORY: No shortness of breath, cough or sputum.  GASTROINTESTINAL: No anorexia, nausea, vomiting or diarrhea. No abdominal pain or blood.  GENITOURINARY: No burning on urination, no polyuria NEUROLOGICAL: No headache, dizziness, syncope, paralysis, ataxia, numbness or tingling in the extremities. No change in bowel or bladder control.  MUSCULOSKELETAL: No muscle, back pain, joint pain or stiffness.  LYMPHATICS: No  enlarged nodes. No history of splenectomy.  PSYCHIATRIC: No history of depression or anxiety.  ENDOCRINOLOGIC: No reports of sweating, cold or heat intolerance. No polyuria or polydipsia.  Marland Kitchen   Physical Examination Today's Vitals   03/22/21 1052  BP: 130/70  Pulse: 66  SpO2: 99%  Weight: 233 lb 9.6 oz (106 kg)  Height: 6' (1.829 m)   Body mass index is 31.68 kg/m.  Gen: resting comfortably, no acute distress HEENT: no scleral icterus, pupils equal round and reactive, no palptable cervical adenopathy,  CV: RRR, no m/r/g no jvd Resp: Clear to auscultation bilaterally GI: abdomen is soft, non-tender, non-distended, normal bowel sounds, no hepatosplenomegaly MSK: extremities are warm, no edema.  Skin: warm, no rash Neuro:  no focal deficits Psych: appropriate affect   Diagnostic Studies  09/2017 holter 24-hour Holter monitor reviewed. Sinus rhythm is present throughout. Heart rate ranged from 58 bpm up to 111 bpm with average heart rate 79 bpm. There were no significant pauses. Occasional PVCs were noted with rare couplets and bigeminy (approximately 3% of total beats). Rare PACs noted.  Jan 2021 echo IMPRESSIONS   1. Left ventricular ejection fraction, by visual estimation, is 55 to 60%. The left ventricle has normal function. Left ventricular septal wall thickness was normal. Normal left ventricular posterior wall thickness. There is no left ventricular  hypertrophy. 2. Left ventricular diastolic function could not be evaluated. 3. Mildly dilated left ventricular internal cavity size. 4. The left ventricle has no regional wall motion abnormalities. 5. Mild hypokinesis of the basal inferior myocardium. 6. Global right ventricle has normal systolic function.The right ventricular size is normal. No increase in right ventricular wall thickness. 7. Left atrial size was mildly dilated. 8. Right atrial size was normal. 9. Mild mitral annular calcification. 10. The  mitral valve is normal in structure. Trivial mitral valve regurgitation. No evidence of mitral stenosis. 11. The tricuspid valve is normal in structure. 12. The aortic valve is tricuspid. Aortic valve regurgitation is not visualized. No evidence of aortic valve sclerosis or stenosis. 13. The pulmonic valve was normal in structure. Pulmonic valve regurgitation is not visualized. 14. Normal pulmonary artery systolic pressure. 15. The inferior vena cava is normal in size with greater than 50% respiratory variability, suggesting right atrial pressure of 3 mmHg.   09/2020 heart monitor  11 day monitor  Min HR 57, Max HR 182, Avg HR 100  Rare supraventricular ectopy in the form of isolated PACs, couplets, triplets. Short runs of SVT up to 10 beats  Occasional ventricular ectopy in the form of isolated PVCs(4.5%), couplets, triplets. Short runs of NSVT up to 7 beats  No diary submitted   Assessment and Plan   1. Afib - new diagnosis in setting of COVID pneumonia.  - no evidence of recurrence at outpatient f/u  or with prior monitor. Appeasrs to have been isoalted in setting of COVID pneumonia. Will d/c eliquis at this time  2. PVCs - chronic, no significant symptoms - continue current meds  3. Preoperative evaluation - considering scrotal surgery - tolerates greater than 4 METs without limitation - we are stopping his eliquis for good at this time - ok to proceed with surgery as planned from cardiac standpoing.    Christopher Sullivan, M.D.,

## 2021-04-04 DIAGNOSIS — N471 Phimosis: Secondary | ICD-10-CM

## 2021-04-04 DIAGNOSIS — A630 Anogenital (venereal) warts: Secondary | ICD-10-CM

## 2021-04-04 DIAGNOSIS — N480 Leukoplakia of penis: Secondary | ICD-10-CM

## 2021-04-10 NOTE — Patient Instructions (Signed)
Christopher Sullivan  04/10/2021     '@PREFPERIOPPHARMACY'$ @   Your procedure is scheduled on  04/13/2021.   Report to [facility] at  1100  A.M.   Call this number if you have problems the morning of surgery:  [phone]   Remember:  Do not eat or drink after midnight.                      Take these medicines the morning of surgery with A SIP OF WATER  Diltiazem, metoprolol.  Place clean sheets on your bed the night before your surgery and DO NOT sleep with pets this night.  Shower with CHG the night before and the morning of your procedure. DO NOT use CHG on your face, hair or genitals.  After each shower, dry off with a clean towel, put on clean, comfortable clothes and brush your teeth.      Do not wear jewelry, make-up or nail polish.  Do not wear lotions, powders, or perfumes, or deodorant.  Do not shave 48 hours prior to surgery.  Men may shave face and neck.  Do not bring valuables to the hospital.  [facility] is not responsible for any belongings or valuables.  Contacts, dentures or bridgework may not be worn into surgery.  Leave your suitcase in the car.  After surgery it may be brought to your room.  For patients admitted to the hospital, discharge time will be determined by your treatment team.  Patients discharged the day of surgery will not be allowed to drive home and must have someone with them for 24 hours.     Special instructions:  DO NOT smoke tobacco or vape for 24 hours before your procedure.  Please read over the following fact sheets that you were given. Coughing and Deep Breathing, Surgical Site Infection Prevention, Anesthesia Post-op Instructions and Care and Recovery After Surgery       Pfenninger &amp; Fowler's procedures for primary care (3rd ed., pp. [phone]). [place], [place]: [place]."> Surgical Guide to Circumcision (pp. 165-176). [place]: [place].">  Circumcision, Adult, Care After This sheet gives you information about how  to care for yourself after your procedure. Your doctor may also give you more specific instructions. If you have problems or questions, contact your doctor. What can I expect after the procedure? After the procedure, it is common for the area around your cut from surgery (incision) to have:  Redness.  Swelling.  Soreness. Follow these instructions at home: Medicines  Take or use over-the-counter and prescription medicines as told by your doctor.  If you were prescribed an antibiotic medicine, use it as told by your doctor. Do not stop using the antibiotic even if you start to feel better. Bathing  Do not get your cut area wet for 24 hours after the procedure, or as long as told by your doctor.  Do not take baths, swim, or use a hot tub until your doctor approves. Ask your doctor if you can take showers. You may only be allowed to take sponge baths.  When you can shower, do not rub the cut area. Gently pat it dry. Cut care  Follow instructions from your doctor about how to take care of your cut from surgery. Make sure you: ? Wash your hands with soap and water for at least 20 seconds before and after you change your bandage (dressing). If you cannot use soap and water, use hand sanitizer. ?  Change your bandage as told by your doctor. ? Leave the stitches (sutures) alone. The stitches will disappear over time.  Check your cut area every day for signs of infection. Check for: ? More redness, swelling, or pain. ? More fluid or blood. ? Warmth. ? Pus or a bad smell.   Managing pain, stiffness, and swelling If told, put ice on the painful area. To do this:  Put ice in a plastic bag.  Place a towel between your skin and the bag.  Leave the ice on for 20 minutes, 2-3 times a day.   Activity  If you were given a medicine to help you relax (sedative) during your procedure, it can affect you for many hours. Do not drive or use machinery until your doctor says that it is safe.  Do  not lift anything that is heavier than 10 lb (4.5 kg), or the limit that you are told, until your doctor says that it is safe.  Avoid contact sports and biking until your doctor says it is okay.  Do not have sex until your doctor says it is okay.  Rest as told by your doctor.  Return to your normal activities as told by your doctor. Ask your doctor what activities are safe for you.   General instructions  Do not use any products that contain nicotine or tobacco, such as cigarettes, e-cigarettes, and chewing tobacco. These can delay healing. If you need help quitting, ask your doctor.  Drink enough fluid to keep your pee (urine) pale yellow.  Keep all follow-up visits as told by your doctor. This is important. Contact a doctor if:  Medicine does not help your pain.  You have any of these signs of infection around your cut from surgery: ? More redness, swelling, or pain. ? More fluid or blood. ? Warmth. ? Pus or a bad smell. ? A fever.  Your cut breaks open. Get help right away if:  You cannot pee, or it hurts to pee.  There is redness, swelling, and soreness that spreads up the shaft of your penis, your thighs, or your lower belly (abdomen).  You have bleeding that does not stop when you press on it. Summary  After the procedure, it is common to have redness, swelling, and soreness around your cut from surgery (incision).  Follow instructions from your doctor about how to take care of your cut from surgery.  Check your cut area every day for signs of infection.  Do not have sex until your doctor says it is okay. This information is not intended to replace advice given to you by your health care provider. Make sure you discuss any questions you have with your health care provider. Document Revised: 09/16/2019 Document Reviewed: 09/16/2019 Elsevier Patient Education  2021 [place] Anesthesia, Adult, Care After This sheet gives you information about how to  care for yourself after your procedure. Your health care provider may also give you more specific instructions. If you have problems or questions, contact your health care provider. What can I expect after the procedure? After the procedure, the following side effects are common:  Pain or discomfort at the IV site.  Nausea.  Vomiting.  Sore throat.  Trouble concentrating.  Feeling cold or chills.  Feeling weak or tired.  Sleepiness and fatigue.  Soreness and body aches. These side effects can affect parts of the body that were not involved in surgery. Follow these instructions at home: For the time period you  were told by your health care provider:  Rest.  Do not participate in activities where you could fall or become injured.  Do not drive or use machinery.  Do not drink alcohol.  Do not take sleeping pills or medicines that cause drowsiness.  Do not make important decisions or sign legal documents.  Do not take care of children on your own.   Eating and drinking  Follow any instructions from your health care provider about eating or drinking restrictions.  When you feel hungry, start by eating small amounts of foods that are soft and easy to digest (bland), such as toast. Gradually return to your regular diet.  Drink enough fluid to keep your urine pale yellow.  If you vomit, rehydrate by drinking water, juice, or clear broth. General instructions  If you have sleep apnea, surgery and certain medicines can increase your risk for breathing problems. Follow instructions from your health care provider about wearing your sleep device: ? Anytime you are sleeping, including during daytime naps. ? While taking prescription pain medicines, sleeping medicines, or medicines that make you drowsy.  Have a responsible adult stay with you for the time you are told. It is important to have someone help care for you until you are awake and alert.  Return to your normal  activities as told by your health care provider. Ask your health care provider what activities are safe for you.  Take over-the-counter and prescription medicines only as told by your health care provider.  If you smoke, do not smoke without supervision.  Keep all follow-up visits as told by your health care provider. This is important. Contact a health care provider if:  You have nausea or vomiting that does not get better with medicine.  You cannot eat or drink without vomiting.  You have pain that does not get better with medicine.  You are unable to pass urine.  You develop a skin rash.  You have a fever.  You have redness around your IV site that gets worse. Get help right away if:  You have difficulty breathing.  You have chest pain.  You have blood in your urine or stool, or you vomit blood. Summary  After the procedure, it is common to have a sore throat or nausea. It is also common to feel tired.  Have a responsible adult stay with you for the time you are told. It is important to have someone help care for you until you are awake and alert.  When you feel hungry, start by eating small amounts of foods that are soft and easy to digest (bland), such as toast. Gradually return to your regular diet.  Drink enough fluid to keep your urine pale yellow.  Return to your normal activities as told by your health care provider. Ask your health care provider what activities are safe for you. This information is not intended to replace advice given to you by your health care provider. Make sure you discuss any questions you have with your health care provider. Document Revised: 07/28/2020 Document Reviewed: 02/25/2020 Elsevier Patient Education  2021 [employer].

## 2021-04-11 ENCOUNTER — Encounter (HOSPITAL_COMMUNITY)
Admission: RE | Admit: 2021-04-11 | Discharge: 2021-04-11 | Disposition: A | Discharge status: Home / Self Care | Payer: Medicare Other | Source: Ambulatory Visit | Attending: Urology | Admitting: Urology

## 2021-04-11 ENCOUNTER — Other Ambulatory Visit: Payer: Self-pay

## 2021-04-11 ENCOUNTER — Other Ambulatory Visit (HOSPITAL_COMMUNITY)
Admission: RE | Admit: 2021-04-11 | Discharge: 2021-04-11 | Disposition: A | Discharge status: Home / Self Care | Payer: Medicare Other | Source: Ambulatory Visit | Attending: Urology | Admitting: Urology

## 2021-04-11 ENCOUNTER — Encounter (HOSPITAL_COMMUNITY): Payer: Self-pay

## 2021-04-11 DIAGNOSIS — Z01812 Encounter for preprocedural laboratory examination: Secondary | ICD-10-CM | POA: Diagnosis not present

## 2021-04-11 DIAGNOSIS — Z20822 Contact with and (suspected) exposure to covid-19: Secondary | ICD-10-CM | POA: Insufficient documentation

## 2021-04-11 LAB — CBC WITH DIFFERENTIAL/PLATELET
Abs Immature Granulocytes: 0.02 10*3/uL (ref 0.00–0.07)
Basophils Absolute: 0.1 10*3/uL (ref 0.0–0.1)
Basophils Relative: 1 %
Eosinophils Absolute: 0.3 10*3/uL (ref 0.0–0.5)
Eosinophils Relative: 3 %
HCT: 44.3 % (ref 39.0–52.0)
Hemoglobin: 14.6 g/dL (ref 13.0–17.0)
Immature Granulocytes: 0 %
Lymphocytes Relative: 24 %
Lymphs Abs: 2.2 10*3/uL (ref 0.7–4.0)
MCH: 29.3 pg (ref 26.0–34.0)
MCHC: 33 g/dL (ref 30.0–36.0)
MCV: 89 fL (ref 80.0–100.0)
Monocytes Absolute: 0.9 10*3/uL (ref 0.1–1.0)
Monocytes Relative: 10 %
Neutro Abs: 5.6 10*3/uL (ref 1.7–7.7)
Neutrophils Relative %: 62 %
Platelets: 174 10*3/uL (ref 150–400)
RBC: 4.98 MIL/uL (ref 4.22–5.81)
RDW: 13.1 % (ref 11.5–15.5)
WBC: 9.1 10*3/uL (ref 4.0–10.5)
nRBC: 0 % (ref 0.0–0.2)

## 2021-04-11 LAB — BASIC METABOLIC PANEL
Anion gap: 9 (ref 5–15)
BUN: 14 mg/dL (ref 8–23)
CO2: 26 mmol/L (ref 22–32)
Calcium: 9.5 mg/dL (ref 8.9–10.3)
Chloride: 100 mmol/L (ref 98–111)
Creatinine, Ser: 0.91 mg/dL (ref 0.61–1.24)
GFR, Estimated: >60 mL/min (ref >60)
Glucose, Bld: 272 mg/dL — ABNORMAL HIGH (ref 70–99)
Potassium: 3.7 mmol/L (ref 3.5–5.1)
Sodium: 135 mmol/L (ref 135–145)

## 2021-04-11 LAB — SARS CORONAVIRUS 2 (TAT 6-24 HRS): SARS Coronavirus 2: NEGATIVE

## 2021-04-11 LAB — HEMOGLOBIN A1C
Hgb A1c MFr Bld: 11.1 % — ABNORMAL HIGH (ref 4.8–5.6)
Mean Plasma Glucose: 271.87 mg/dL

## 2021-04-13 ENCOUNTER — Encounter (HOSPITAL_COMMUNITY)
Admission: RE | Disposition: A | Discharge status: Home / Self Care | Payer: Self-pay | Source: Home / Self Care | Attending: Urology

## 2021-04-13 ENCOUNTER — Ambulatory Visit (HOSPITAL_COMMUNITY)
Admission: RE | Admit: 2021-04-13 | Discharge: 2021-04-13 | Disposition: A | Discharge status: Home / Self Care | Payer: Medicare Other | Attending: Urology | Admitting: Urology

## 2021-04-13 ENCOUNTER — Ambulatory Visit (HOSPITAL_COMMUNITY): Payer: Medicare Other | Admitting: Certified Registered"

## 2021-04-13 ENCOUNTER — Encounter (HOSPITAL_COMMUNITY): Payer: Self-pay | Admitting: Urology

## 2021-04-13 ENCOUNTER — Other Ambulatory Visit: Payer: Self-pay

## 2021-04-13 DIAGNOSIS — A630 Anogenital (venereal) warts: Secondary | ICD-10-CM | POA: Insufficient documentation

## 2021-04-13 DIAGNOSIS — N471 Phimosis: Secondary | ICD-10-CM | POA: Diagnosis not present

## 2021-04-13 DIAGNOSIS — E119 Type 2 diabetes mellitus without complications: Secondary | ICD-10-CM | POA: Diagnosis not present

## 2021-04-13 DIAGNOSIS — N480 Leukoplakia of penis: Secondary | ICD-10-CM | POA: Insufficient documentation

## 2021-04-13 DIAGNOSIS — Z888 Allergy status to other drugs, medicaments and biological substances status: Secondary | ICD-10-CM | POA: Diagnosis not present

## 2021-04-13 DIAGNOSIS — N5089 Other specified disorders of the male genital organs: Secondary | ICD-10-CM | POA: Diagnosis present

## 2021-04-13 HISTORY — PX: SCROTAL EXPLORATION: SHX2386

## 2021-04-13 HISTORY — PX: CIRCUMCISION: SHX1350

## 2021-04-13 LAB — GLUCOSE, CAPILLARY
Glucose-Capillary: 286 mg/dL — ABNORMAL HIGH (ref 70–99)
Glucose-Capillary: 243 mg/dL — ABNORMAL HIGH (ref 70–99)

## 2021-04-13 SURGERY — EXPLORATION, SCROTUM
Anesthesia: General

## 2021-04-13 MED ORDER — OXYCODONE-ACETAMINOPHEN 5-325 MG PO TABS
2.0000 | ORAL_TABLET | Freq: Once | ORAL | Status: AC
  Administered 2021-04-13: 2 via ORAL

## 2021-04-13 MED ORDER — BUPIVACAINE HCL (PF) 0.25 % IJ SOLN
INTRAMUSCULAR | Status: DC | PRN
  Administered 2021-04-13: 10 mL

## 2021-04-13 MED ORDER — DEXAMETHASONE SODIUM PHOSPHATE 10 MG/ML IJ SOLN
INTRAMUSCULAR | Status: AC
  Filled 2021-04-13: qty 1

## 2021-04-13 MED ORDER — FENTANYL CITRATE (PF) 100 MCG/2ML IJ SOLN
INTRAMUSCULAR | Status: AC
  Filled 2021-04-13: qty 2

## 2021-04-13 MED ORDER — DEXAMETHASONE SODIUM PHOSPHATE 4 MG/ML IJ SOLN
INTRAMUSCULAR | Status: DC | PRN
  Administered 2021-04-13: 4 mg via INTRAVENOUS

## 2021-04-13 MED ORDER — ONDANSETRON HCL 4 MG/2ML IJ SOLN
4.0000 mg | Freq: Once | INTRAMUSCULAR | Status: AC | PRN
  Administered 2021-04-13: 4 mg via INTRAVENOUS
  Filled 2021-04-13: qty 2

## 2021-04-13 MED ORDER — OXYCODONE-ACETAMINOPHEN 5-325 MG PO TABS
ORAL_TABLET | ORAL | Status: AC
  Filled 2021-04-13: qty 2

## 2021-04-13 MED ORDER — BUPIVACAINE HCL (PF) 0.25 % IJ SOLN
INTRAMUSCULAR | Status: AC
  Filled 2021-04-13: qty 30

## 2021-04-13 MED ORDER — 0.9 % SODIUM CHLORIDE (POUR BTL) OPTIME
TOPICAL | Status: DC | PRN
  Administered 2021-04-13: 1000 mL

## 2021-04-13 MED ORDER — ONDANSETRON HCL 4 MG/2ML IJ SOLN
INTRAMUSCULAR | Status: AC
  Filled 2021-04-13: qty 2

## 2021-04-13 MED ORDER — ORAL CARE MOUTH RINSE: 15.0000 mL | Freq: Once | OROMUCOSAL | Status: AC

## 2021-04-13 MED ORDER — LACTATED RINGERS IV SOLN: INTRAVENOUS | Status: DC

## 2021-04-13 MED ORDER — FENTANYL CITRATE (PF) 100 MCG/2ML IJ SOLN
INTRAMUSCULAR | Status: DC | PRN
  Administered 2021-04-13: 50 ug via INTRAVENOUS
  Administered 2021-04-13 (×2): 25 ug via INTRAVENOUS

## 2021-04-13 MED ORDER — GLYCOPYRROLATE PF 0.2 MG/ML IJ SOSY
PREFILLED_SYRINGE | INTRAMUSCULAR | Status: AC
  Filled 2021-04-13: qty 1

## 2021-04-13 MED ORDER — MIDAZOLAM HCL 2 MG/2ML IJ SOLN
INTRAMUSCULAR | Status: AC
  Filled 2021-04-13: qty 2

## 2021-04-13 MED ORDER — CHLORHEXIDINE GLUCONATE 0.12 % MT SOLN
15.0000 mL | Freq: Once | OROMUCOSAL | Status: AC
  Administered 2021-04-13: 15 mL via OROMUCOSAL

## 2021-04-13 MED ORDER — GLYCOPYRROLATE PF 0.2 MG/ML IJ SOSY
PREFILLED_SYRINGE | INTRAMUSCULAR | Status: DC | PRN
  Administered 2021-04-13: .1 mg via INTRAVENOUS

## 2021-04-13 MED ORDER — CEFAZOLIN SODIUM-DEXTROSE 2-4 GM/100ML-% IV SOLN
2.0000 g | INTRAVENOUS | Status: AC
  Administered 2021-04-13: 2 g via INTRAVENOUS
  Filled 2021-04-13: qty 100

## 2021-04-13 MED ORDER — OXYCODONE-ACETAMINOPHEN 5-325 MG PO TABS: 1.0000 | ORAL_TABLET | ORAL | 0 refills | Status: AC | PRN

## 2021-04-13 MED ORDER — PROPOFOL 10 MG/ML IV BOLUS
INTRAVENOUS | Status: DC | PRN
  Administered 2021-04-13: 150 mg via INTRAVENOUS

## 2021-04-13 MED ORDER — LIDOCAINE HCL (PF) 2 % IJ SOLN
INTRAMUSCULAR | Status: AC
  Filled 2021-04-13: qty 5

## 2021-04-13 MED ORDER — MIDAZOLAM HCL 5 MG/5ML IJ SOLN
INTRAMUSCULAR | Status: DC | PRN
  Administered 2021-04-13: 2 mg via INTRAVENOUS

## 2021-04-13 MED ORDER — LIDOCAINE 2% (20 MG/ML) 5 ML SYRINGE
INTRAMUSCULAR | Status: DC | PRN
  Administered 2021-04-13: 100 mg via INTRAVENOUS

## 2021-04-13 MED ORDER — FENTANYL CITRATE (PF) 100 MCG/2ML IJ SOLN
25.0000 ug | INTRAMUSCULAR | Status: DC | PRN
  Administered 2021-04-13 (×2): 50 ug via INTRAVENOUS

## 2021-04-13 MED ORDER — ONDANSETRON HCL 4 MG/2ML IJ SOLN
INTRAMUSCULAR | Status: DC | PRN
  Administered 2021-04-13: 4 mg via INTRAVENOUS

## 2021-04-13 SURGICAL SUPPLY — 29 items
BNDG COHESIVE 2X5 TAN STRL LF (GAUZE/BANDAGES/DRESSINGS) ×2 IMPLANT
BNDG CONFORM 2 STRL LF (GAUZE/BANDAGES/DRESSINGS) ×2 IMPLANT
COVER LIGHT HANDLE STERIS (MISCELLANEOUS) ×4 IMPLANT
COVER SURGICAL LIGHT HANDLE (MISCELLANEOUS) ×4 IMPLANT
COVER WAND RF STERILE (DRAPES) ×2 IMPLANT
DECANTER SPIKE VIAL GLASS SM (MISCELLANEOUS) ×2 IMPLANT
ELECT NEEDLE TIP 2.8 STRL (NEEDLE) ×2 IMPLANT
ELECT REM PT RETURN 9FT ADLT (ELECTROSURGICAL) ×2
ELECTRODE REM PT RTRN 9FT ADLT (ELECTROSURGICAL) ×1 IMPLANT
GAUZE XEROFORM 1X8 LF (GAUZE/BANDAGES/DRESSINGS) ×2 IMPLANT
GLOVE BIO SURGEON STRL SZ8 (GLOVE) ×2 IMPLANT
GLOVE SRG 8 PF TXTR STRL LF DI (GLOVE) ×1 IMPLANT
GLOVE SURG UNDER POLY LF SZ7 (GLOVE) ×4 IMPLANT
GLOVE SURG UNDER POLY LF SZ8 (GLOVE) ×2
GOWN STRL REUS W/TWL LRG LVL3 (GOWN DISPOSABLE) ×2 IMPLANT
GOWN STRL REUS W/TWL XL LVL3 (GOWN DISPOSABLE) ×2 IMPLANT
KIT TURNOVER KIT A (KITS) ×2 IMPLANT
MANIFOLD NEPTUNE II (INSTRUMENTS) ×2 IMPLANT
NEEDLE HYPO 25X1 1.5 SAFETY (NEEDLE) ×2 IMPLANT
NS IRRIG 1000ML POUR BTL (IV SOLUTION) ×2 IMPLANT
PACK MINOR (CUSTOM PROCEDURE TRAY) ×2 IMPLANT
PAD ARMBOARD 7.5X6 YLW CONV (MISCELLANEOUS) ×2 IMPLANT
SET BASIN LINEN APH (SET/KITS/TRAYS/PACK) ×2 IMPLANT
SOL PREP PROV IODINE SCRUB 4OZ (MISCELLANEOUS) ×2 IMPLANT
SUT VIC AB 3-0 SH 27 (SUTURE) ×2
SUT VIC AB 3-0 SH 27X BRD (SUTURE) ×1 IMPLANT
SUT VIC AB 4-0 SH 27 (SUTURE) ×4
SUT VIC AB 4-0 SH 27XBRD (SUTURE) ×2 IMPLANT
SYR CONTROL 10ML LL (SYRINGE) ×2 IMPLANT

## 2021-04-13 NOTE — Anesthesia Procedure Notes (Signed)
Procedure Name: LMA Insertion Date/Time: 04/13/2021 11:28 AM Performed by: Orlie Dakin, CRNA Pre-anesthesia Checklist: Patient identified, Emergency Drugs available, Suction available and Patient being monitored Patient Re-evaluated:Patient Re-evaluated prior to induction Oxygen Delivery Method: Circle system utilized Preoxygenation: Pre-oxygenation with 100% oxygen Induction Type: IV induction LMA: LMA inserted LMA Size: 5.0 Tube type: Oral Number of attempts: 1 Placement Confirmation: positive ETCO2 Tube secured with: Tape Dental Injury: Teeth and Oropharynx as per pre-operative assessment

## 2021-04-13 NOTE — Op Note (Signed)
Preoperative diagnosis: phimosis, scrotal lesion  Postoperative diagnosis: Same  Procedure: 1. Circumcision 2. Excision of scrotal lesion  Attending: Nicolette Bang, MD  Anesthesia: general  History of blood loss: Minimal  Antibiotics: ancef  Drains: none  Specimens: foreskin, left posterior scrotal lesion  Findings: dense phimotic ring. No masses/lesions on glans or foreskin. 2cm scrotal lesion left posterior scrotum  Indications: Patient is a 65 year old male with a history of phimosis, difficulty urinating, and recurrent balanoprosthitis. He has a known scrotal lesion that has been growing in size  After discussing treatment options he decided to proceed with circumcision and excision of scrotal lesion  Procedure in detail: Prior to procedure consent was obtained.  Patient was brought to the operating room and a brief timeout was done to ensure correct patient, correct procedure, correct site.  General anesthesia was administered and patient was placed in supine position.  His genitalia and abdomen was then prepped and draped in usual sterile fashion. We made an elliptical incision around the scrotal lesion and then using sharp dissection the scrotal skin with underlying dartos was removed. Hemostasis was accomplished with electrocautery. We then closed the incision with 3-0 vicryl in a running fashion. The phimotic ring was incised sharply and the foreskin was then retracted. An circumferential incision was made 1.5cm below the coronal ridge. We then pulled the foreskin over the glans and made a separate circumferential incision at the level of the coronal ridge. We then sharply incised the foreskin and then freed it from the dartos using electrocautery. We then sent the foreskin for pathology. Individual bleeders were then cauterized and good hemostasis was noted. We then reapproximated the penile skin to the subcoronal incision. We used interrupted 3-0 vicryl to close the incision.  Once this was complete we applied a gauze bandage and this then concluded the procedure which was well tolerated by the patient.  Complications: None  Condition: Stable, extubated, transferred to PACU.  Plan: Patient is to be discharged home.  He is to followup in 2 weeks for a wound check

## 2021-04-13 NOTE — Anesthesia Postprocedure Evaluation (Signed)
Anesthesia Post Note  Patient: Christopher Sullivan  Procedure(s) Performed: EXCISION OF SCROTAL LESION (N/A ) CIRCUMCISION (N/A )  Patient location during evaluation: Phase II Anesthesia Type: General Level of consciousness: awake Pain management: pain level controlled Vital Signs Assessment: post-procedure vital signs reviewed and stable Respiratory status: spontaneous breathing and respiratory function stable Cardiovascular status: blood pressure returned to baseline and stable Postop Assessment: no headache and no apparent nausea or vomiting Anesthetic complications: no Comments: Late entry   No complications documented.   Last Vitals:  Vitals:   04/13/21 1300 04/13/21 1319  BP: 133/79   Pulse: 67 (!) 56  Resp: 16 16  Temp:  36.5 C  SpO2: 98% 98%    Last Pain:  Vitals:   04/13/21 1319  TempSrc: Oral  PainSc: Bode

## 2021-04-13 NOTE — Pre-Procedure Instructions (Signed)
Pt's time to arrive for procedure was changed to 0830 instead of 1100. Called pt and talked with wife, stated that she was not aware he had to be here at 0830. She stated that pt would have to make further arrangements and would arrive as soon as possible.

## 2021-04-13 NOTE — Transfer of Care (Signed)
Immediate Anesthesia Transfer of Care Note  Patient: Christopher Sullivan  Procedure(s) Performed: EXCISION OF SCROTAL LESION (N/A ) CIRCUMCISION (N/A )  Patient Location: PACU  Anesthesia Type:General  Level of Consciousness: drowsy  Airway & Oxygen Therapy: Patient Spontanous Breathing and Patient connected to face mask oxygen  Post-op Assessment: Report given to RN and Post -op Vital signs reviewed and stable  Post vital signs: Reviewed and stable  Last Vitals:  Vitals Value Taken Time  BP 124/71   Temp    Pulse 62 04/13/21 1212  Resp 16 04/13/21 1212  SpO2 99 % 04/13/21 1212  Vitals shown include unvalidated device data.  Last Pain:  Vitals:   04/13/21 1007  TempSrc: Oral  PainSc: 0-No pain         Complications: No complications documented.

## 2021-04-13 NOTE — Discharge Instructions (Signed)
Pfenninger &amp; Fowler's procedures for primary care (3rd ed., pp. [phone]). [place], [place]: [place]."> Surgical Guide to Circumcision (pp. [phone]). [place]: [place].">  Circumcision, Adult, Care After This sheet gives you information about how to care for yourself after your procedure. Your doctor may also give you more specific instructions. If you have problems or questions, contact your doctor. What can I expect after the procedure? After the procedure, it is common for the area around your cut from surgery (incision) to have:  Redness.  Swelling.  Soreness. Follow these instructions at home: Medicines  Take or use over-the-counter and prescription medicines as told by your doctor.  If you were prescribed an antibiotic medicine, use it as told by your doctor. Do not stop using the antibiotic even if you start to feel better. Bathing  Do not get your cut area wet for 24 hours after the procedure, or as long as told by your doctor.  Do not take baths, swim, or use a hot tub until your doctor approves. Ask your doctor if you can take showers. You may only be allowed to take sponge baths.  When you can shower, do not rub the cut area. Gently pat it dry. Cut care  Follow instructions from your doctor about how to take care of your cut from surgery. Make sure you: ? Wash your hands with soap and water for at least 20 seconds before and after you change your bandage (dressing). If you cannot use soap and water, use hand sanitizer. ? Change your bandage as told by your doctor. ? Leave the stitches (sutures) alone. The stitches will disappear over time.  Check your cut area every day for signs of infection. Check for: ? More redness, swelling, or pain. ? More fluid or blood. ? Warmth. ? Pus or a bad smell.   Managing pain, stiffness, and swelling If told, put ice on the painful area. To do this:  Put ice in a plastic bag.  Place a towel between your skin and the  bag.  Leave the ice on for 20 minutes, 2-3 times a day.   Activity  If you were given a medicine to help you relax (sedative) during your procedure, it can affect you for many hours. Do not drive or use machinery until your doctor says that it is safe.  Do not lift anything that is heavier than 10 lb (4.5 kg), or the limit that you are told, until your doctor says that it is safe.  Avoid contact sports and biking until your doctor says it is okay.  Do not have sex until your doctor says it is okay.  Rest as told by your doctor.  Return to your normal activities as told by your doctor. Ask your doctor what activities are safe for you.   General instructions  Do not use any products that contain nicotine or tobacco, such as cigarettes, e-cigarettes, and chewing tobacco. These can delay healing. If you need help quitting, ask your doctor.  Drink enough fluid to keep your pee (urine) pale yellow.  Keep all follow-up visits as told by your doctor. This is important. Contact a doctor if:  Medicine does not help your pain.  You have any of these signs of infection around your cut from surgery: ? More redness, swelling, or pain. ? More fluid or blood. ? Warmth. ? Pus or a bad smell. ? A fever.  Your cut breaks open. Get help right away if:  You cannot pee, or  it hurts to pee.  There is redness, swelling, and soreness that spreads up the shaft of your penis, your thighs, or your lower belly (abdomen).  You have bleeding that does not stop when you press on it. Summary  After the procedure, it is common to have redness, swelling, and soreness around your cut from surgery (incision).  Follow instructions from your doctor about how to take care of your cut from surgery.  Check your cut area every day for signs of infection.  Do not have sex until your doctor says it is okay. This information is not intended to replace advice given to you by your health care provider. Make sure  you discuss any questions you have with your health care provider. Document Revised: 09/16/2019 Document Reviewed: 09/16/2019 Elsevier Patient Education  2021 [employer].  Acetaminophen; Oxycodone tablets What is this medicine? ACETAMINOPHEN; OXYCODONE (a set a MEE noe fen; ox i KOE done) is a pain reliever. It is used to treat moderate to severe pain. This medicine may be used for other purposes; ask your health care provider or pharmacist if you have questions. COMMON BRAND NAME(S): Endocet, Magnacet, Nalocet, Narvox, Percocet, Perloxx, Primalev, Primlev, Prolate, Roxicet, Xolox What should I tell my health care provider before I take this medicine? They need to know if you have any of these conditions:  brain tumor  drug abuse or addiction  head injury  heart disease  if you often drink alcohol   kidney disease   liver disease  low adrenal gland function  lung disease, asthma, or breathing problem  seizures  stomach or intestine problems  taken an MAOI like Marplan, Nardil, or Parnate in the last 14 days  an unusual or allergic reaction to acetaminophen, oxycodone, other medicines, foods, dyes, or preservative  pregnant or trying to get pregnant  breast-feeding How should I use this medicine? Take this medicine by mouth with a full glass of water. Take it as directed on the label. You can take it with or without food. If it upsets your stomach, take it with food. Do not use it more often than directed. There may be unused or extra doses in the bottle after you finish your treatment. Talk to your health care provider if you have questions about your dose. A special MedGuide will be given to you by the pharmacist with each prescription and refill. Be sure to read this information carefully each time. Talk to your health care provider about the use of this medicine in children. Special care may be needed. Patients over 18 years of age may have a stronger reaction and  need a smaller dose. Overdosage: If you think you have taken too much of this medicine contact a poison control center or emergency room at once. NOTE: This medicine is only for you. Do not share this medicine with others. What if I miss a dose? This does not apply. This medicine is not for regular use. It should only be used as needed. What may interact with this medicine? This medicine may interact with the following medications:  alcohol  antihistamines for allergy, cough and cold  antiviral medicines for HIV or AIDS  atropine  certain antibiotics like clarithromycin, erythromycin, linezolid, rifampin  certain medicines for anxiety or sleep  certain medicines for bladder problems like oxybutynin, tolterodine  certain medicines for depression like amitriptyline, fluoxetine, sertraline  certain medicines for fungal infections like ketoconazole, itraconazole, voriconazole  certain medicines for migraine headache like almotriptan, eletriptan, frovatriptan, naratriptan,  rizatriptan, sumatriptan, zolmitriptan  certain medicines for nausea or vomiting like dolasetron, ondansetron, palonosetron  certain medicines for Parkinson's disease like benztropine, trihexyphenidyl  certain medicines for seizures like phenobarbital, phenytoin, primidone  certain medicines for stomach problems like dicyclomine, hyoscyamine  certain medicines for travel sickness like scopolamine  diuretics  general anesthetics like halothane, isoflurane, methoxyflurane, propofol  ipratropium  local anesthetics like lidocaine, pramoxine, tetracaine  MAOIs like Carbex, Eldepryl, Marplan, Nardil, and Parnate  medicines that relax muscles for surgery  methylene blue  nilotinib  other medicines with acetaminophen  other narcotic medicines for pain or cough  phenothiazines like chlorpromazine, mesoridazine, prochlorperazine, thioridazine This list may not describe all possible interactions. Give  your health care provider a list of all the medicines, herbs, non-prescription drugs, or dietary supplements you use. Also tell them if you smoke, drink alcohol, or use illegal drugs. Some items may interact with your medicine. What should I watch for while using this medicine? Tell your health care provider if your pain does not go away, if it gets worse, or if you have new or a different type of pain. You may develop tolerance to this drug. Tolerance means that you will need a higher dose of the drug for pain relief. Tolerance is normal and is expected if you take this drug for a long time. There are different types of narcotic drugs (opioids) for pain. If you take more than one type at the same time, you may have more side effects. Give your health care provider a list of all drugs you use. He or she will tell you how much drug to take. Do not take more drug than directed. Get emergency help right away if you have problems breathing. Do not suddenly stop taking your drug because you may develop a severe reaction. Your body becomes used to the drug. This does NOT mean you are addicted. Addiction is a behavior related to getting and using a drug for a nonmedical reason. If you have pain, you have a medical reason to take pain drug. Your health care provider will tell you how much drug to take. If your health care provider wants you to stop the drug, the dose will be slowly lowered over time to avoid any side effects. Talk to your health care provider about naloxone and how to get it. Naloxone is an emergency drug used for an opioid overdose. An overdose can happen if you take too much opioid. It can also happen if an opioid is taken with some other drugs or substances, like alcohol. Know the symptoms of an overdose, like trouble breathing, unusually tired or sleepy, or not being able to respond or wake up. Make sure to tell caregivers and close contacts where it is stored. Make sure they know how to use it.  After naloxone is given, you must get emergency help right away. Naloxone is a temporary treatment. Repeat doses may be needed. Do not take other drugs that contain acetaminophen with this drug. Many non-prescription drugs contain acetaminophen. Always read labels carefully. If you have questions, ask your health care provider. If you take too much acetaminophen, get medical help right away. Too much acetaminophen can be very dangerous and cause liver damage. Even if you do not have symptoms, it is important to get help right away. This drug does not prevent a heart attack or stroke. This drug may increase the chance of a heart attack or stroke. The chance may increase the longer you use this drug  or if you have heart disease. If you take aspirin to prevent a heart attack or stroke, talk to your health care provider about using this drug. You may get drowsy or dizzy. Do not drive, use machinery, or do anything that needs mental alertness until you know how this drug affects you. Do not stand up or sit up quickly, especially if you are an older patient. This reduces the risk of dizzy or fainting spells. Alcohol may interfere with the effect of this drug. Avoid alcoholic drinks. This drug will cause constipation. If you do not have a bowel movement for 3 days, call your health care provider. Your mouth may get dry. Chewing sugarless gum or sucking hard candy and drinking plenty of water may help. Contact your health care provider if the problem does not go away or is severe. What side effects may I notice from receiving this medicine? Side effects that you should report to your doctor or health care professional as soon as possible:  allergic reactions (skin rash, itching or hives; swelling of the face, lips, or tongue)  confusion  kidney injury (trouble passing urine or change in the amount of urine)  light-colored stool  liver injury (dark yellow or brown urine; general ill feeling or flu-like  symptoms; loss of appetite, right upper belly pain; unusually weak or tired, yellowing of the eyes or skin)  low adrenal gland function (nausea; vomiting; loss of appetite; unusually weak or tired; dizziness; low blood pressure)  low blood pressure (dizziness; feeling faint or lightheaded, falls; unusually weak or tired)  redness, blistering, peeling, or loosening of the skin, including inside the mouth  serotonin syndrome (irritable; confusion; diarrhea; fast or irregular heartbeat; muscle twitching; stiff muscles; trouble walking; sweating; high fever; seizures; chills; vomiting)  trouble breathing Side effects that usually do not require medical attention (report to your doctor or health care professional if they continue or are bothersome):  constipation  dry mouth  nausea, vomiting  tiredness This list may not describe all possible side effects. Call your doctor for medical advice about side effects. You may report side effects to FDA at 1-800-FDA-1088. Where should I keep my medicine? Keep out of the reach of children and pets. This medicine can be abused. Keep it in a safe place to protect it from theft. Do not share it with anyone. It is only for you. Selling or giving away this medicine is dangerous and against the law. Store at room temperature between 20 and 25 degrees C (68 and 77 degrees F). Protect from light. Get rid of any unused medicine after the expiration date. This medicine may cause harm and death if it is taken by other adults, children, or pets. It is important to get rid of the medicine as soon as you no longer need it or it is expired. You can do this in two ways:  Take the medicine to a medicine take-back program. Check with your pharmacy or law enforcement to find a location.  If you cannot return the medicine, flush it down the toilet. NOTE: This sheet is a summary. It may not cover all possible information. If you have questions about this medicine, talk to  your doctor, pharmacist, or health care provider.  2021 Elsevier/Gold Standard (2020-08-10 11:12:15)

## 2021-04-13 NOTE — H&P (Signed)
Urology Admission H&P  Chief Complaint: phimosis and scrotal cyst  History of Present Illness: [name] is a 65yo with a hx of scrotal wall cyst and phimosis here for circumcision and excision of the scrotal wall cyst. No complaints today  Past Medical History:  Diagnosis Date  . Bilateral primary osteoarthritis of knee   . Diabetes mellitus without complication ([place])   . GERD (gastroesophageal reflux disease)   . Hypercholesteremia   . Hyperlipidemia   . Hypertension    Past Surgical History:  Procedure Laterality Date  . CATARACT EXTRACTION    . None      Home Medications:  Current Facility-Administered Medications  Medication Dose Route Frequency Provider Last Rate Last Admin  . ceFAZolin (ANCEF) IVPB 2g/100 mL premix  2 g Intravenous 30 min Pre-Op Franchot Gallo, MD       Allergies:  Allergies  Allergen Reactions  . Lisinopril Swelling    Family History  Problem Relation Age of Onset  . Hypertension Mother   . Diabetes Mother   . Arthritis Mother   . Heart disease Mother   . Miscarriages / [place] Mother   . Stroke Mother   . Depression Father   . Mental illness Father   . Lupus Sister   . Diabetes Daughter   . Hypertension Daughter   . Colon cancer Neg Hx   . Colon polyps Neg Hx    Social History:  reports that he has never smoked. He has never used smokeless tobacco. He reports that he does not drink alcohol and does not use drugs.  Review of Systems  All other systems reviewed and are negative.   Physical Exam:  Vital signs in last 24 hours:   Physical Exam Vitals reviewed.  Constitutional:      Appearance: Normal appearance.  HENT:     Head: Normocephalic and atraumatic.     Nose: Nose normal.     Mouth/Throat:     Mouth: Mucous membranes are dry.  Eyes:     Extraocular Movements: Extraocular movements intact.     Pupils: Pupils are equal, round, and reactive to light.  Cardiovascular:     Rate and Rhythm: Normal rate and regular  rhythm.  Pulmonary:     Effort: Pulmonary effort is normal. No respiratory distress.  Abdominal:     General: Abdomen is flat. There is no distension.  Musculoskeletal:        General: No swelling. Normal range of motion.     Cervical back: Normal range of motion and neck supple.  Skin:    General: Skin is warm and dry.  Neurological:     General: No focal deficit present.     Mental Status: He is alert and oriented to person, place, and time.  Psychiatric:        Mood and Affect: Mood normal.        Behavior: Behavior normal.        Thought Content: Thought content normal.        Judgment: Judgment normal.     Laboratory Data:  No results found for this or any previous visit (from the past 24 hour(s)). Recent Results (from the past 240 hour(s))  SARS CORONAVIRUS 2 (TAT 6-24 HRS) Nasopharyngeal Nasopharyngeal Swab     Status: None   Collection Time: 04/11/21 11:11 AM   Specimen: Nasopharyngeal Swab  Result Value Ref Range Status   SARS Coronavirus 2 NEGATIVE NEGATIVE Final    Comment: (NOTE) SARS-CoV-2 target nucleic acids  are NOT DETECTED.  The SARS-CoV-2 RNA is generally detectable in upper and lower respiratory specimens during the acute phase of infection. Negative results do not preclude SARS-CoV-2 infection, do not rule out co-infections with other pathogens, and should not be used as the sole basis for treatment or other patient management decisions. Negative results must be combined with clinical observations, patient history, and epidemiological information. The expected result is Negative.  Fact Sheet for Patients: SugarRoll.be  Fact Sheet for Healthcare Providers: https://www.woods-mathews.com/  This test is not yet approved or cleared by the [place] FDA and  has been authorized for detection and/or diagnosis of SARS-CoV-2 by FDA under an Emergency Use Authorization (EUA). This EUA will remain  in effect  (meaning this test can be used) for the duration of the COVID-19 declaration under Se ction 564(b)(1) of the Act, 21 U.S.C. section 360bbb-3(b)(1), unless the authorization is terminated or revoked sooner.  Performed at [facility], [place] [address] [place], [place] [address]    Creatinine: Recent Labs    04/11/21 1129  CREATININE 0.91   Baseline Creatinine: 0.9  Impression/Assessment:  64yo with phimosis and scrotal cyst  Plan:  The risks/benefits/alternatives to circumcision and excision of the scrotal cyst was explained to the patient and he understands and wishes to proceed with surgery  Nicolette Bang 04/13/2021, 9:59 AM

## 2021-04-13 NOTE — Anesthesia Preprocedure Evaluation (Signed)
Anesthesia Evaluation  Patient identified by MRN, date of birth, ID band Patient awake    Reviewed: Allergy & Precautions, H&P , NPO status , Patient's Chart, lab work & pertinent test results, reviewed documented beta blocker date and time   Airway Mallampati: II  TM Distance: >3 FB Neck ROM: full    Dental no notable dental hx.    Pulmonary pneumonia,    Pulmonary exam normal breath sounds clear to auscultation       Cardiovascular Exercise Tolerance: Good hypertension, negative cardio ROS   Rhythm:regular Rate:Normal     Neuro/Psych negative neurological ROS  negative psych ROS   GI/Hepatic Neg liver ROS, GERD  Medicated,  Endo/Other  negative endocrine ROSdiabetes  Renal/GU negative Renal ROS  negative genitourinary   Musculoskeletal   Abdominal   Peds  Hematology negative hematology ROS (+)   Anesthesia Other Findings   Reproductive/Obstetrics negative OB ROS                             Anesthesia Physical Anesthesia Plan  ASA: III  Anesthesia Plan: General   Post-op Pain Management:    Induction:   PONV Risk Score and Plan: Ondansetron  Airway Management Planned:   Additional Equipment:   Intra-op Plan:   Post-operative Plan:   Informed Consent: I have reviewed the patients History and Physical, chart, labs and discussed the procedure including the risks, benefits and alternatives for the proposed anesthesia with the patient or authorized representative who has indicated his/her understanding and acceptance.     Dental Advisory Given  Plan Discussed with: CRNA  Anesthesia Plan Comments:         Anesthesia Quick Evaluation

## 2021-04-14 ENCOUNTER — Encounter (HOSPITAL_COMMUNITY): Payer: Self-pay | Admitting: Urology

## 2021-04-17 LAB — SURGICAL PATHOLOGY

## 2021-05-22 ENCOUNTER — Telehealth: Payer: Medicare Other | Admitting: Podiatry

## 2021-05-22 NOTE — Telephone Encounter (Signed)
Diabetic shoes/inserts in..lvm for pt to call to schedule an appt to pick them up. 

## 2021-05-30 ENCOUNTER — Ambulatory Visit: Payer: Medicare Other | Admitting: Urology

## 2021-05-30 ENCOUNTER — Other Ambulatory Visit: Payer: Self-pay

## 2021-06-05 ENCOUNTER — Other Ambulatory Visit: Payer: Self-pay

## 2021-06-05 ENCOUNTER — Ambulatory Visit (INDEPENDENT_AMBULATORY_CARE_PROVIDER_SITE_OTHER): Payer: Medicare Other | Admitting: Podiatry

## 2021-06-05 DIAGNOSIS — M2142 Flat foot [pes planus] (acquired), left foot: Secondary | ICD-10-CM | POA: Diagnosis not present

## 2021-06-05 DIAGNOSIS — M2141 Flat foot [pes planus] (acquired), right foot: Secondary | ICD-10-CM | POA: Diagnosis not present

## 2021-06-05 DIAGNOSIS — M2041 Other hammer toe(s) (acquired), right foot: Secondary | ICD-10-CM | POA: Diagnosis not present

## 2021-06-05 DIAGNOSIS — E1142 Type 2 diabetes mellitus with diabetic polyneuropathy: Secondary | ICD-10-CM | POA: Diagnosis not present

## 2021-06-05 DIAGNOSIS — L84 Corns and callosities: Secondary | ICD-10-CM

## 2021-06-05 NOTE — Progress Notes (Signed)
The patient presented to the office today to pick up diabetic shoes and 3 pair diabetic custom inserts  1 pair of inserts were put in the shoes and the shoes were fitted to the patient. The patient states they are comfortable and free of defect. He was satisfied with the fit of the shoe. Instructions for break in and wear were dispensed. The patient signed the delivery documentation and break in instruction form  If any concerns or questions arise, he is instructed to call the office

## 2021-06-27 ENCOUNTER — Ambulatory Visit (INDEPENDENT_AMBULATORY_CARE_PROVIDER_SITE_OTHER): Payer: Medicare Other | Admitting: Urology

## 2021-06-27 ENCOUNTER — Encounter: Payer: Self-pay | Admitting: Urology

## 2021-06-27 ENCOUNTER — Other Ambulatory Visit: Payer: Self-pay

## 2021-06-27 VITALS — BP 137/82 | HR 80

## 2021-06-27 DIAGNOSIS — N471 Phimosis: Secondary | ICD-10-CM

## 2021-06-27 DIAGNOSIS — L900 Lichen sclerosus et atrophicus: Secondary | ICD-10-CM | POA: Diagnosis not present

## 2021-06-27 DIAGNOSIS — N509 Disorder of male genital organs, unspecified: Secondary | ICD-10-CM | POA: Diagnosis not present

## 2021-06-27 DIAGNOSIS — N5201 Erectile dysfunction due to arterial insufficiency: Secondary | ICD-10-CM | POA: Diagnosis not present

## 2021-06-27 LAB — URINALYSIS, ROUTINE W REFLEX MICROSCOPIC
Bilirubin, UA: NEGATIVE
Ketones, UA: NEGATIVE
Nitrite, UA: POSITIVE — AB
Specific Gravity, UA: >1.03 — ABNORMAL HIGH (ref 1.005–1.030)
Urobilinogen, Ur: 0.2 mg/dL (ref 0.2–1.0)
pH, UA: 5.5 (ref 5.0–7.5)

## 2021-06-27 LAB — MICROSCOPIC EXAMINATION
Renal Epithel, UA: NONE SEEN /hpf
WBC, UA: >30 /hpf — AB (ref 0–5)

## 2021-06-27 MED ORDER — SILDENAFIL CITRATE 100 MG PO TABS: ORAL_TABLET | ORAL | 11 refills | Status: AC

## 2021-06-27 NOTE — Progress Notes (Signed)
Urological Symptom Review  Patient is experiencing the following symptoms: none   Review of Systems  Gastrointestinal (upper)  : Negative for upper GI symptoms  Gastrointestinal (lower) : Negative for lower GI symptoms  Constitutional : Negative for symptoms  Skin: Negative for skin symptoms  Eyes: Negative for eye symptoms  Ear/Nose/Throat : Negative for Ear/Nose/Throat symptoms  Hematologic/Lymphatic: Negative for Hematologic/Lymphatic symptoms  Cardiovascular : Negative for cardiovascular symptoms  Respiratory : Negative for respiratory symptoms  Endocrine: Negative for endocrine symptoms  Musculoskeletal: Back pain  Neurological: Negative for neurological symptoms  Psychologic: Negative for psychiatric symptoms

## 2021-06-27 NOTE — Progress Notes (Signed)
History of Present Illness: This man returns today for follow-up of circumcision and excision of scrotal lesion on 5.19.2022.  Procedure performed by Dr. Alyson Ingles.  Pathology of scrotal lesion revealed condyloma acuminatum.  Pathology of foreskin revealed lichen sclerosis.  He has had no postoperative issues.  He does complain of ED.   Past Medical History:  Diagnosis Date   Bilateral primary osteoarthritis of knee    Diabetes mellitus without complication (HCC)    GERD (gastroesophageal reflux disease)    Hypercholesteremia    Hyperlipidemia    Hypertension     Past Surgical History:  Procedure Laterality Date   CATARACT EXTRACTION     CIRCUMCISION N/A 04/13/2021   Procedure: CIRCUMCISION;  Surgeon: [name], MD;  Location: [facility];  Service: Urology;  Laterality: N/A;   None     SCROTAL EXPLORATION N/A 04/13/2021   Procedure: EXCISION OF SCROTAL LESION;  Surgeon: [name], MD;  Location: [facility];  Service: Urology;  Laterality: N/A;    Home Medications:  Allergies as of 06/27/2021       Reactions   Lisinopril Swelling        Medication List        Accurate as of June 27, 2021  7:53 AM. If you have any questions, ask your nurse or doctor.          atorvastatin 20 MG tablet Commonly known as: LIPITOR Take 1 tablet (20 mg total) by mouth daily.   diltiazem 120 MG 24 hr capsule Commonly known as: CARDIZEM CD Take 120 mg by mouth daily.   glipiZIDE 10 MG 24 hr tablet Commonly known as: Glucotrol XL Take 1 tablet (10 mg total) by mouth daily with breakfast.   hydrochlorothiazide 25 MG tablet Commonly known as: HYDRODIURIL Take 25 mg by mouth daily.   metFORMIN 1000 MG tablet Commonly known as: GLUCOPHAGE Take 1,000 mg by mouth 2 (two) times daily with a meal.   metoprolol succinate 25 MG 24 hr tablet Commonly known as: TOPROL-XL Take 1 tablet (25 mg total) by mouth daily.   oxyCODONE-acetaminophen 5-325 MG tablet Commonly known  as: Percocet Take 1 tablet by mouth every 4 (four) hours as needed.        Allergies:  Allergies  Allergen Reactions   Lisinopril Swelling    Family History  Problem Relation Age of Onset   Hypertension Mother    Diabetes Mother    Arthritis Mother    Heart disease Mother    Miscarriages / [place] Mother    Stroke Mother    Depression Father    Mental illness Father    Lupus Sister    Diabetes Daughter    Hypertension Daughter    Colon cancer Neg Hx    Colon polyps Neg Hx     Social History:  reports that he has never smoked. He has never used smokeless tobacco. He reports that he does not drink alcohol and does not use drugs.  ROS: A complete review of systems was performed.  All systems are negative except for pertinent findings as noted.  Physical Exam:  Vital signs in last 24 hours: There were no vitals taken for this visit. Constitutional:  Alert and oriented, No acute distress Cardiovascular: Regular rate  Respiratory: Normal respiratory effort GI: Abdomen is soft, nontender, nondistended, no abdominal masses. No CVAT.  Genitourinary: Phallus circumcised, no lesions.  Well-healed.  Scrotal skin normal. Lymphatic: No lymphadenopathy Neurologic: Grossly intact, no focal deficits Psychiatric: Normal mood and  affect  I have reviewed prior pt notes  I have reviewed notes from referring/previous physicians  I have reviewed urinalysis results  Pathology reviewed   Impression/Assessment:  1.  Large condyloma on scrotum, status post resection doing well  2.  Phimosis with BXO on pathology, healing well  3.  ED, bothersome  Plan: I will have him return as needed for follow-up  I did prescribe [place] Meth can get this refilled by his PCP

## 2021-09-21 DIAGNOSIS — R8290 Unspecified abnormal findings in urine: Secondary | ICD-10-CM | POA: Diagnosis not present

## 2021-09-21 DIAGNOSIS — Z23 Encounter for immunization: Secondary | ICD-10-CM | POA: Diagnosis not present

## 2021-09-21 DIAGNOSIS — E782 Mixed hyperlipidemia: Secondary | ICD-10-CM | POA: Diagnosis not present

## 2021-09-21 DIAGNOSIS — I10 Essential (primary) hypertension: Secondary | ICD-10-CM | POA: Diagnosis not present

## 2021-09-21 DIAGNOSIS — L309 Dermatitis, unspecified: Secondary | ICD-10-CM | POA: Diagnosis not present

## 2021-09-21 DIAGNOSIS — E1169 Type 2 diabetes mellitus with other specified complication: Secondary | ICD-10-CM | POA: Diagnosis not present

## 2021-09-27 DIAGNOSIS — R8290 Unspecified abnormal findings in urine: Secondary | ICD-10-CM | POA: Diagnosis not present

## 2022-03-13 DIAGNOSIS — L309 Dermatitis, unspecified: Secondary | ICD-10-CM | POA: Diagnosis not present

## 2022-03-13 DIAGNOSIS — E782 Mixed hyperlipidemia: Secondary | ICD-10-CM | POA: Diagnosis not present

## 2022-03-13 DIAGNOSIS — E1169 Type 2 diabetes mellitus with other specified complication: Secondary | ICD-10-CM | POA: Diagnosis not present

## 2022-03-13 DIAGNOSIS — M21969 Unspecified acquired deformity of unspecified lower leg: Secondary | ICD-10-CM | POA: Diagnosis not present

## 2022-03-13 DIAGNOSIS — Z1159 Encounter for screening for other viral diseases: Secondary | ICD-10-CM | POA: Diagnosis not present

## 2022-03-13 DIAGNOSIS — Z23 Encounter for immunization: Secondary | ICD-10-CM | POA: Diagnosis not present

## 2022-03-13 DIAGNOSIS — Z79899 Other long term (current) drug therapy: Secondary | ICD-10-CM | POA: Diagnosis not present

## 2022-03-13 DIAGNOSIS — I10 Essential (primary) hypertension: Secondary | ICD-10-CM | POA: Diagnosis not present

## 2022-03-13 DIAGNOSIS — Z0000 Encounter for general adult medical examination without abnormal findings: Secondary | ICD-10-CM | POA: Diagnosis not present

## 2022-04-02 NOTE — Progress Notes (Incomplete)
History of Present Illness: He underwent circumcision and excision of scrotal lesion on 5.19.2022.  Procedure performed by Dr. Alyson Ingles. ? ?Pathology of scrotal lesion revealed condyloma acuminatum.  Pathology of foreskin revealed lichen sclerosis. ? ?He has had no postoperative issues. ? ?He was placed on sildenafil for ED. ? ?5.9.2023:  ? ?Past Medical History:  ?Diagnosis Date  ? Bilateral primary osteoarthritis of knee   ? Diabetes mellitus without complication ([place])   ? GERD (gastroesophageal reflux disease)   ? Hypercholesteremia   ? Hyperlipidemia   ? Hypertension   ? ? ?Past Surgical History:  ?Procedure Laterality Date  ? CATARACT EXTRACTION    ? CIRCUMCISION N/A 04/13/2021  ? Procedure: CIRCUMCISION;  Surgeon: [name], MD;  Location: [facility];  Service: Urology;  Laterality: N/A;  ? None    ? SCROTAL EXPLORATION N/A 04/13/2021  ? Procedure: EXCISION OF SCROTAL LESION;  Surgeon: [name], MD;  Location: [facility];  Service: Urology;  Laterality: N/A;  ? ? ?Home Medications:  ?Allergies as of 04/03/2022   ? ?   Reactions  ? Lisinopril Swelling  ? ?  ? ?  ?Medication List  ?  ? ?  ? Accurate as of Apr 02, 2022  8:09 PM. If you have any questions, ask your nurse or doctor.  ?  ?  ? ?  ? ?atorvastatin 20 MG tablet ?Commonly known as: LIPITOR ?Take 1 tablet (20 mg total) by mouth daily. ?  ?diltiazem 120 MG 24 hr capsule ?Commonly known as: CARDIZEM CD ?Take 120 mg by mouth daily. ?  ?glipiZIDE 10 MG 24 hr tablet ?Commonly known as: Glucotrol XL ?Take 1 tablet (10 mg total) by mouth daily with breakfast. ?  ?hydrochlorothiazide 25 MG tablet ?Commonly known as: HYDRODIURIL ?Take 25 mg by mouth daily. ?  ?metFORMIN 1000 MG tablet ?Commonly known as: GLUCOPHAGE ?Take 1,000 mg by mouth 2 (two) times daily with a meal. ?  ?metoprolol succinate 25 MG 24 hr tablet ?Commonly known as: TOPROL-XL ?Take 1 tablet (25 mg total) by mouth daily. ?  ?oxyCODONE-acetaminophen 5-325 MG tablet ?Commonly known as:  Percocet ?Take 1 tablet by mouth every 4 (four) hours as needed. ?  ?sildenafil 100 MG tablet ?Commonly known as: VIAGRA ?1/2 to 1 tablet p.o. as needed ?  ? ?  ? ? ?Allergies:  ?Allergies  ?Allergen Reactions  ? Lisinopril Swelling  ? ? ?Family History  ?Problem Relation Age of Onset  ? Hypertension Mother   ? Diabetes Mother   ? Arthritis Mother   ? Heart disease Mother   ? Miscarriages / [place] Mother   ? Stroke Mother   ? Depression Father   ? Mental illness Father   ? Lupus Sister   ? Diabetes Daughter   ? Hypertension Daughter   ? Colon cancer Neg Hx   ? Colon polyps Neg Hx   ? ? ?Social History:  reports that he has never smoked. He has never used smokeless tobacco. He reports that he does not drink alcohol and does not use drugs. ? ?ROS: ?A complete review of systems was performed.  All systems are negative except for pertinent findings as noted. ? ?Physical Exam:  ?Vital signs in last 24 hours: ?There were no vitals taken for this visit. ?Constitutional:  Alert and oriented, No acute distress ?Cardiovascular: Regular rate  ?Respiratory: Normal respiratory effort ?GI: Abdomen is soft, nontender, nondistended, no abdominal masses. No CVAT.  ?Genitourinary: Normal male phallus, testes are descended bilaterally  and non-tender and without masses, scrotum is normal in appearance without lesions or masses, perineum is normal on inspection. ?Lymphatic: No lymphadenopathy ?Neurologic: Grossly intact, no focal deficits ?Psychiatric: Normal mood and affect ? ?I have reviewed prior pt notes ? ?I have reviewed notes from previous physicians ? ?I have reviewed urinalysis results ? ?I have reviewed prior pathology results ? ? ?Impression/Assessment:  ?*** ? ?Plan:  ?*** ? ?

## 2022-04-03 ENCOUNTER — Ambulatory Visit: Payer: Medicare Other | Admitting: Urology

## 2022-04-03 DIAGNOSIS — N509 Disorder of male genital organs, unspecified: Secondary | ICD-10-CM

## 2022-04-03 DIAGNOSIS — L900 Lichen sclerosus et atrophicus: Secondary | ICD-10-CM

## 2022-04-03 DIAGNOSIS — R351 Nocturia: Secondary | ICD-10-CM

## 2022-04-03 DIAGNOSIS — N400 Enlarged prostate without lower urinary tract symptoms: Secondary | ICD-10-CM

## 2022-04-03 DIAGNOSIS — N471 Phimosis: Secondary | ICD-10-CM

## 2022-04-03 DIAGNOSIS — N5201 Erectile dysfunction due to arterial insufficiency: Secondary | ICD-10-CM

## 2022-04-05 DIAGNOSIS — I872 Venous insufficiency (chronic) (peripheral): Secondary | ICD-10-CM | POA: Diagnosis not present

## 2022-04-20 ENCOUNTER — Ambulatory Visit: Payer: Medicare Other | Admitting: Podiatry

## 2022-05-14 ENCOUNTER — Ambulatory Visit: Payer: Medicare Other | Admitting: Podiatry

## 2022-05-14 DIAGNOSIS — E1142 Type 2 diabetes mellitus with diabetic polyneuropathy: Secondary | ICD-10-CM

## 2022-05-14 DIAGNOSIS — B351 Tinea unguium: Secondary | ICD-10-CM | POA: Diagnosis not present

## 2022-05-14 DIAGNOSIS — M79675 Pain in left toe(s): Secondary | ICD-10-CM

## 2022-05-14 DIAGNOSIS — M2141 Flat foot [pes planus] (acquired), right foot: Secondary | ICD-10-CM | POA: Diagnosis not present

## 2022-05-14 DIAGNOSIS — L84 Corns and callosities: Secondary | ICD-10-CM

## 2022-05-14 DIAGNOSIS — M2142 Flat foot [pes planus] (acquired), left foot: Secondary | ICD-10-CM | POA: Diagnosis not present

## 2022-05-14 DIAGNOSIS — E119 Type 2 diabetes mellitus without complications: Secondary | ICD-10-CM

## 2022-05-14 DIAGNOSIS — M79674 Pain in right toe(s): Secondary | ICD-10-CM

## 2022-05-14 NOTE — Progress Notes (Signed)
  Subjective:  Patient ID: Christopher Sullivan, male    DOB: [birth year],  MRN: [id number]  Chief Complaint  Patient presents with   Nail Problem   Callouses   Diabetes    66 y.o. male returns for follow-up with the above complaint. History confirmed with patient.  Says his diabetes is well controlled.  He has some painful calluses that are bothering him.  The nails are thickened and elongated as well.  He would like to diabetic shoes to be made again.  Objective:  Physical Exam: warm, good capillary refill, DP reduced bilateral, no trophic changes or ulcerative lesions, PT reduced bilateral and reduced sensation at plantar soles and toe pulps. Left Foot: submet callus 5th head mycotic thick crumbling toenails present 1-5  Right foot: mycotic thick crumbling toenails present 1-5:    Assessment:   1. Pain due to onychomycosis of toenails of both feet   2. Type 2 diabetes mellitus with diabetic polyneuropathy, without long-term current use of insulin ([place])   3. Pes planus of both feet   4. Encounter for comprehensive diabetic foot examination, type 2 diabetes mellitus ([place])   5. Callus of foot      Plan:  Patient was evaluated and treated and all questions answered.   Patient educated on diabetes. Discussed proper diabetic foot care and discussed risks and complications of disease. Educated patient in depth on reasons to return to the office immediately should he/she discover anything concerning or new on the feet. All questions answered. Discussed proper shoes as well.  We will schedule him for diabetic shoe fitting  Discussed the etiology and treatment options for the condition in detail with the patient. Educated patient on the topical and oral treatment options for mycotic nails. Recommended debridement of the nails today. Sharp and mechanical debridement performed of all painful and mycotic nails today. Nails debrided in length and thickness using a nail nipper and a mechanical burr to  level of comfort. Discussed treatment options including appropriate shoe gear. Follow up as needed for painful nails.  All symptomatic hyperkeratoses were safely debrided with a sterile #15 blade to patient's level of comfort without incident. We discussed preventative and palliative care of these lesions including supportive and accommodative shoegear, padding, prefabricated and custom molded accommodative orthoses, use of a pumice stone and lotions/creams daily.   Return in about 3 months (around 08/14/2022) for at risk diabetic foot care.   Lanae Crumbly, DPM 05/14/2022

## 2022-05-18 ENCOUNTER — Encounter: Payer: Self-pay | Admitting: *Deleted

## 2022-05-18 ENCOUNTER — Ambulatory Visit (INDEPENDENT_AMBULATORY_CARE_PROVIDER_SITE_OTHER): Payer: Medicare Other | Admitting: *Deleted

## 2022-05-18 DIAGNOSIS — M2141 Flat foot [pes planus] (acquired), right foot: Secondary | ICD-10-CM

## 2022-05-18 DIAGNOSIS — L84 Corns and callosities: Secondary | ICD-10-CM

## 2022-05-18 DIAGNOSIS — M2042 Other hammer toe(s) (acquired), left foot: Secondary | ICD-10-CM

## 2022-05-18 DIAGNOSIS — M2142 Flat foot [pes planus] (acquired), left foot: Secondary | ICD-10-CM

## 2022-05-18 DIAGNOSIS — M2041 Other hammer toe(s) (acquired), right foot: Secondary | ICD-10-CM

## 2022-05-18 DIAGNOSIS — E1142 Type 2 diabetes mellitus with diabetic polyneuropathy: Secondary | ICD-10-CM

## 2022-07-24 ENCOUNTER — Encounter: Payer: Self-pay | Admitting: Podiatry

## 2022-08-20 ENCOUNTER — Encounter: Payer: Self-pay | Admitting: Podiatry

## 2022-08-20 ENCOUNTER — Ambulatory Visit (INDEPENDENT_AMBULATORY_CARE_PROVIDER_SITE_OTHER): Payer: Medicare Other | Admitting: Podiatry

## 2022-08-20 DIAGNOSIS — E785 Hyperlipidemia, unspecified: Secondary | ICD-10-CM | POA: Diagnosis not present

## 2022-08-20 DIAGNOSIS — I10 Essential (primary) hypertension: Secondary | ICD-10-CM | POA: Diagnosis not present

## 2022-08-20 DIAGNOSIS — M79675 Pain in left toe(s): Secondary | ICD-10-CM | POA: Diagnosis not present

## 2022-08-20 DIAGNOSIS — E1142 Type 2 diabetes mellitus with diabetic polyneuropathy: Secondary | ICD-10-CM | POA: Diagnosis not present

## 2022-08-20 DIAGNOSIS — Q828 Other specified congenital malformations of skin: Secondary | ICD-10-CM | POA: Diagnosis not present

## 2022-08-20 DIAGNOSIS — B351 Tinea unguium: Secondary | ICD-10-CM

## 2022-08-20 DIAGNOSIS — E1169 Type 2 diabetes mellitus with other specified complication: Secondary | ICD-10-CM

## 2022-08-20 DIAGNOSIS — L84 Corns and callosities: Secondary | ICD-10-CM | POA: Diagnosis not present

## 2022-08-20 DIAGNOSIS — M79674 Pain in right toe(s): Secondary | ICD-10-CM | POA: Diagnosis not present

## 2022-08-20 NOTE — Progress Notes (Signed)
  Subjective:  Patient ID: Christopher Sullivan, male    DOB: 09-28-1956,  MRN: [id number]  Christopher Sullivan presents to clinic today for at risk foot care with history of diabetic neuropathy and callus(es) b/l lower extremities, porokeratotic lesion(s) b/l lower extremities, and painful mycotic nails. Painful toenails interfere with ambulation. Aggravating factors include wearing enclosed shoe gear. Pain is relieved with periodic professional debridement. Painful callus(es) and porokeratotic lesion(s) are aggravated when weightbearing with and without shoegear. Pain is relieved with periodic professional debridement.    He is accompanied by his wife on today's visit.  Last known HgA1c was unknown. Patient did not check blood glucose this morning.  New problem(s): None.   Patient is concerned about his feet because his mother was diabetic and had bilateral above knee amputations.  PCP is [name], MD , and last visit was  March 13, 2022.  Allergies  Allergen Reactions   Lisinopril Swelling    Review of Systems: Negative except as noted in the HPI.  Objective: No changes noted in today's physical examination. Christopher Sullivan is a pleasant 66 y.o. male in NAD. AAO x 3.  Vascular Examination: CFT <3 seconds b/l LE. Faintly palpable pedal pulses b/l LE. Pedal hair absent b/l LE. Skin temperature gradient WNL b/l. No pain with calf compression b/l. No edema b/l LE. No cyanosis or clubbing noted b/l LE.  Neurological Examination: Protective sensation decreased with 10 gram monofilament b/l.  Dermatological Examination: Pedal skin with normal turgor, texture and tone b/l. Toenails 1-5 b/l thick, discolored, elongated with subungual debris and pain on dorsal palpation.   Hyperkeratotic lesion(s) sub 5th met base b/l lower extremities.  No erythema, no edema, no drainage, no fluctuance. Porokeratotic lesion(s) submet head 1 b/l, submet head 4 left foot, and submet head 5 right foot. No erythema, no edema, no  drainage, no fluctuance.  Hyperkeratotic lesion(s) submet head 5 b/l.  No erythema, no edema, no drainage, no fluctuance.  Musculoskeletal Examination: Muscle strength 5/5 to b/l LE. Severe hammertoe deformity noted 2-5 bilaterally. Pes planus deformity noted b/l lower extremities.  Radiographs: None  Assessment/Plan: 1. Pain due to onychomycosis of toenails of both feet   2. Porokeratosis   3. Callus   4. Hyperlipidemia associated with type 2 diabetes mellitus ([place])   5. Essential hypertension   6. Type 2 diabetes mellitus with diabetic polyneuropathy, without long-term current use of insulin ([place])      -Patient was evaluated and treated. All patient's and/or POA's questions/concerns answered on today's visit. -Examined patient. -Patient flagged in Epic for Limb at Risk. Ordered noninvasive arterial studies ABIs with and without TBIs for b/l lower extremities. He will receive a call from [facility] for an appointment. He is aware. -Patient is awaiting delivery of his new diabetic shoes. -Continue foot and shoe inspections daily. Monitor blood glucose per PCP/Endocrinologist's recommendations. -Patient to continue soft, supportive shoe gear daily. -Toenails 1-5 b/l were debrided in length and girth with sterile nail nippers and dremel without iatrogenic bleeding.  -Callus(es) sub 5th met base b/l lower extremities pared utilizing sterile scalpel blade without complication or incident. Total number debrided =2. -Porokeratotic lesion(s) submet head 1 b/l, submet head 4 left foot, and submet head 5 right foot pared and enucleated with sterile currette without incident. Total number of lesions debrided=4. -Patient/POA to call should there be question/concern in the interim.   Return in about 3 months (around 11/19/2022).  Marzetta Board, DPM

## 2022-08-20 NOTE — Patient Instructions (Signed)
Christopher Sullivan,  I have ordered a test to check your circulation (blood flow) in your lower extremities. You will get a call to schedule the test. I will call you with the results.

## 2022-09-20 ENCOUNTER — Ambulatory Visit (HOSPITAL_COMMUNITY)
Admission: RE | Admit: 2022-09-20 | Discharge: 2022-09-20 | Disposition: A | Discharge status: Home / Self Care | Payer: Medicare Other | Source: Ambulatory Visit | Attending: Podiatry | Admitting: Podiatry

## 2022-09-20 DIAGNOSIS — E785 Hyperlipidemia, unspecified: Secondary | ICD-10-CM | POA: Insufficient documentation

## 2022-09-20 DIAGNOSIS — E1142 Type 2 diabetes mellitus with diabetic polyneuropathy: Secondary | ICD-10-CM | POA: Diagnosis not present

## 2022-09-20 DIAGNOSIS — I10 Essential (primary) hypertension: Secondary | ICD-10-CM | POA: Diagnosis not present

## 2022-09-20 DIAGNOSIS — M25551 Pain in right hip: Secondary | ICD-10-CM | POA: Diagnosis not present

## 2022-09-20 DIAGNOSIS — E1169 Type 2 diabetes mellitus with other specified complication: Secondary | ICD-10-CM | POA: Diagnosis not present

## 2022-09-27 ENCOUNTER — Other Ambulatory Visit (INDEPENDENT_AMBULATORY_CARE_PROVIDER_SITE_OTHER): Payer: Medicare Other | Admitting: Podiatry

## 2022-09-27 DIAGNOSIS — M2041 Other hammer toe(s) (acquired), right foot: Secondary | ICD-10-CM

## 2022-09-27 DIAGNOSIS — E1169 Type 2 diabetes mellitus with other specified complication: Secondary | ICD-10-CM

## 2022-09-27 DIAGNOSIS — E1142 Type 2 diabetes mellitus with diabetic polyneuropathy: Secondary | ICD-10-CM

## 2022-09-27 DIAGNOSIS — L84 Corns and callosities: Secondary | ICD-10-CM

## 2022-09-27 DIAGNOSIS — I10 Essential (primary) hypertension: Secondary | ICD-10-CM

## 2022-09-27 DIAGNOSIS — M2042 Other hammer toe(s) (acquired), left foot: Secondary | ICD-10-CM

## 2022-09-27 DIAGNOSIS — E785 Hyperlipidemia, unspecified: Secondary | ICD-10-CM

## 2022-09-27 NOTE — Progress Notes (Signed)
Routine referral placed to Vascular Surgery for patient with h/o diabetes, neuropathy, hyperlipidemia. TBIs decreased. No open wounds. No rest pain.

## 2022-10-09 ENCOUNTER — Telehealth: Payer: Self-pay | Admitting: Podiatry

## 2022-10-09 DIAGNOSIS — E782 Mixed hyperlipidemia: Secondary | ICD-10-CM | POA: Diagnosis not present

## 2022-10-09 DIAGNOSIS — R809 Proteinuria, unspecified: Secondary | ICD-10-CM | POA: Diagnosis not present

## 2022-10-09 DIAGNOSIS — E1169 Type 2 diabetes mellitus with other specified complication: Secondary | ICD-10-CM | POA: Diagnosis not present

## 2022-10-09 DIAGNOSIS — Z1211 Encounter for screening for malignant neoplasm of colon: Secondary | ICD-10-CM | POA: Diagnosis not present

## 2022-10-09 DIAGNOSIS — Z23 Encounter for immunization: Secondary | ICD-10-CM | POA: Diagnosis not present

## 2022-10-09 DIAGNOSIS — I10 Essential (primary) hypertension: Secondary | ICD-10-CM | POA: Diagnosis not present

## 2022-10-09 DIAGNOSIS — R8290 Unspecified abnormal findings in urine: Secondary | ICD-10-CM | POA: Diagnosis not present

## 2022-10-09 NOTE — Telephone Encounter (Signed)
Pts wife called and asking about diabetic shoe paperwork. She asked if his paperwork was refaxed to pcp. The fax # is [phone]

## 2022-10-09 NOTE — Telephone Encounter (Signed)
done

## 2022-10-16 ENCOUNTER — Encounter (INDEPENDENT_AMBULATORY_CARE_PROVIDER_SITE_OTHER): Payer: Self-pay | Admitting: *Deleted

## 2022-10-17 ENCOUNTER — Ambulatory Visit (INDEPENDENT_AMBULATORY_CARE_PROVIDER_SITE_OTHER): Payer: Medicare Other | Admitting: *Deleted

## 2022-10-17 ENCOUNTER — Ambulatory Visit: Payer: Medicare Other | Admitting: Vascular Surgery

## 2022-10-17 ENCOUNTER — Encounter: Payer: Self-pay | Admitting: Vascular Surgery

## 2022-10-17 VITALS — BP 124/75 | HR 81 | Temp 98.2°F | Resp 18 | Ht 72.0 in | Wt 223.0 lb

## 2022-10-17 DIAGNOSIS — M2142 Flat foot [pes planus] (acquired), left foot: Secondary | ICD-10-CM | POA: Diagnosis not present

## 2022-10-17 DIAGNOSIS — M2042 Other hammer toe(s) (acquired), left foot: Secondary | ICD-10-CM | POA: Diagnosis not present

## 2022-10-17 DIAGNOSIS — M2141 Flat foot [pes planus] (acquired), right foot: Secondary | ICD-10-CM | POA: Diagnosis not present

## 2022-10-17 DIAGNOSIS — E1142 Type 2 diabetes mellitus with diabetic polyneuropathy: Secondary | ICD-10-CM | POA: Diagnosis not present

## 2022-10-17 DIAGNOSIS — M2041 Other hammer toe(s) (acquired), right foot: Secondary | ICD-10-CM

## 2022-10-17 DIAGNOSIS — I872 Venous insufficiency (chronic) (peripheral): Secondary | ICD-10-CM

## 2022-10-17 NOTE — Progress Notes (Signed)
Patient ID: Christopher Sullivan, male   DOB: Sep 09, 1956, 66 y.o.   MRN: [id number]  Reason for Consult: New Patient (Initial Visit) (C/o b/l LE pain while ambulating )   Referred by [name], MD  Subjective:     HPI:  Christopher Sullivan is a 66 y.o. male without significant vascular history.  His mother did have heart disease as well as strokes and also had bilateral above-knee amputations. He has risk factors of hypercholesterolemia and diabetes and hypertension.  He is a lifelong non-smoker.  States that he has rashes on his legs do itch and he occasionally has pain but mostly just numbness across the toes which is chronic.  He denies claudication..  Past Medical History:  Diagnosis Date   Bilateral primary osteoarthritis of knee    Diabetes mellitus without complication (HCC)    GERD (gastroesophageal reflux disease)    Hypercholesteremia    Hyperlipidemia    Hypertension    Family History  Problem Relation Age of Onset   Hypertension Mother    Diabetes Mother    Arthritis Mother    Heart disease Mother    55 / [place] Mother    Stroke Mother    Depression Father    Mental illness Father    Lupus Sister    Diabetes Daughter    Hypertension Daughter    Colon cancer Neg Hx    Colon polyps Neg Hx    Past Surgical History:  Procedure Laterality Date   CATARACT EXTRACTION     CIRCUMCISION N/A 04/13/2021   Procedure: CIRCUMCISION;  Surgeon: [name], MD;  Location: [facility];  Service: Urology;  Laterality: N/A;   None     SCROTAL EXPLORATION N/A 04/13/2021   Procedure: EXCISION OF SCROTAL LESION;  Surgeon: [name], MD;  Location: [facility];  Service: Urology;  Laterality: N/A;    Short Social History:  Social History   Tobacco Use   Smoking status: Never   Smokeless tobacco: Never  Substance Use Topics   Alcohol use: No    Allergies  Allergen Reactions   Lisinopril Swelling    Current Outpatient Medications  Medication Sig Dispense  Refill   atorvastatin (LIPITOR) 20 MG tablet Take 1 tablet (20 mg total) by mouth daily. 90 tablet 3   diltiazem (CARDIZEM CD) 120 MG 24 hr capsule Take 120 mg by mouth daily.     glipiZIDE (GLUCOTROL XL) 10 MG 24 hr tablet Take 1 tablet (10 mg total) by mouth daily with breakfast. 90 tablet 1   hydrochlorothiazide (HYDRODIURIL) 25 MG tablet Take 25 mg by mouth daily.     metFORMIN (GLUCOPHAGE) 1000 MG tablet Take 1,000 mg by mouth 2 (two) times daily with a meal.      metoprolol succinate (TOPROL-XL) 25 MG 24 hr tablet Take 1 tablet (25 mg total) by mouth daily. 90 tablet 3   sildenafil (VIAGRA) 100 MG tablet 1/2 to 1 tablet p.o. as needed 5 tablet 11   No current facility-administered medications for this visit.    Review of Systems  Constitutional:  Constitutional negative. HENT: HENT negative.  Eyes: Eyes negative.  Respiratory: Respiratory negative.  Cardiovascular: Cardiovascular negative.  GI: Gastrointestinal negative.  Skin: Positive for rash.  Neurological: Positive for numbness.  Hematologic: Hematologic/lymphatic negative.  Psychiatric: Psychiatric negative.        Objective:  Objective   Vitals:   10/17/22 1116  BP: 124/75  Pulse: 81  Resp: 18  Temp: 98.2 F (36.8 C)  TempSrc: Temporal  SpO2: 96%  Weight: 223 lb (101.2 kg)  Height: 6' (1.829 m)   Body mass index is 30.24 kg/m.  Physical Exam HENT:     Head: Normocephalic.  Eyes:     Pupils: Pupils are equal, round, and reactive to light.  Neck:     Vascular: No carotid bruit.  Cardiovascular:     Rate and Rhythm: Normal rate.     Comments: Palpable anterior tibial pulses bilaterally Pulmonary:     Effort: Pulmonary effort is normal.  Abdominal:     General: Abdomen is flat.     Palpations: Abdomen is soft.  Musculoskeletal:     Cervical back: Neck supple.     Right lower leg: No edema.     Left lower leg: Edema present.     Comments: Multiple varicosities from the left medial thigh down to  the left medial leg  Skin:    Comments: There appears to be psoriasis on the right knee and left lateral leg and there is skin changes consistent with C4 venous disease of the left medial ankle  Neurological:     General: No focal deficit present.     Mental Status: He is alert.  Psychiatric:        Mood and Affect: Mood normal.        Thought Content: Thought content normal.     Data: ABI Findings:  +---------+------------------+-----+--------+--------+  Right   Rt Pressure (mmHg)IndexWaveformComment   +---------+------------------+-----+--------+--------+  Brachial 122                                      +---------+------------------+-----+--------+--------+  PTA     154               1.17 biphasic          +---------+------------------+-----+--------+--------+  PERO    146               1.11 biphasic          +---------+------------------+-----+--------+--------+  DP      150               1.14 biphasic          +---------+------------------+-----+--------+--------+  Great Toe107               0.81 Normal            +---------+------------------+-----+--------+--------+   +---------+------------------+-----+--------+-------+  Left    Lt Pressure (mmHg)IndexWaveformComment  +---------+------------------+-----+--------+-------+  Brachial 132                                     +---------+------------------+-----+--------+-------+  PTA     154               1.17 biphasic         +---------+------------------+-----+--------+-------+  PERO    148               1.21 biphasic         +---------+------------------+-----+--------+-------+  DP      144               1.09 biphasic         +---------+------------------+-----+--------+-------+  Great Toe45                0.34 Normal           +---------+------------------+-----+--------+-------+   +-------+-----------+-----------+------------+------------+  ABI/TBIToday's ABIToday's TBIPrevious ABIPrevious TBI  +-------+-----------+-----------+------------+------------+  Right 1.17       .81                                  +-------+-----------+-----------+------------+------------+  Left  1.17       .34                                  +-------+-----------+-----------+------------+------------+       TOES Findings:  +----------+---------------+--------+-------+  Right ToesPressure (mmHg)WaveformComment  +----------+---------------+--------+-------+  1st Digit                Normal           +----------+---------------+--------+-------+  2nd Digit                Abnormal         +----------+---------------+--------+-------+  3rd Digit                Abnormal         +----------+---------------+--------+-------+  4th Digit                Normal           +----------+---------------+--------+-------+  5th Digit                Normal           +----------+---------------+--------+-------+      +---------+---------------+--------+-------+  Left ToesPressure (mmHg)WaveformComment  +---------+---------------+--------+-------+  1st Digit               Normal           +---------+---------------+--------+-------+  2nd Digit               Abnormal         +---------+---------------+--------+-------+  3rd Digit               Normal           +---------+---------------+--------+-------+  4th Digit               Normal           +---------+---------------+--------+-------+  5th Digit               Normal           +---------+---------------+--------+-------+           Summary:  Right: Resting right ankle-brachial index is within normal range. The  right toe-brachial index is normal.   Left: Resting left ankle-brachial index is within normal range. The left  toe-brachial index is abnormal.       Assessment/Plan:    66 year old male with risk  factors for vascular disease concerned because of family history of bilateral above-knee amputations.  He has skin changes on the left consistent with C4 a venous disease and also has multiple varicosities on the left lower extremity.  For this reason we will fit him for thigh-high compression socks.  I will have him follow-up in 3 months at that time we will obtain repeat ABI's and we will also have left lower extremity venous reflux testing.  Patient was instructed to continue walking and elevate his legs when he is recumbent.  Compression socks to be worn when patient is awake and out of bed during the day.  Patient is wife demonstrate good understanding.     Waynetta Sandy MD [facility]

## 2022-10-17 NOTE — Progress Notes (Signed)
Patient presents today to pick up diabetic shoes and insoles.  Patient was dispensed 1 pair of diabetic shoes and 3 pairs of foam casted diabetic insoles. Fit was satisfactory. Instructions for break-in and wear was reviewed and a copy was given to the patient.   Re-appointment for regularly scheduled diabetic foot care visits or if they should experience any trouble with the shoes or insoles.  

## 2022-10-24 ENCOUNTER — Other Ambulatory Visit: Payer: Self-pay

## 2022-10-24 DIAGNOSIS — I872 Venous insufficiency (chronic) (peripheral): Secondary | ICD-10-CM

## 2022-10-24 DIAGNOSIS — I739 Peripheral vascular disease, unspecified: Secondary | ICD-10-CM

## 2022-11-15 ENCOUNTER — Telehealth: Payer: Self-pay | Admitting: Cardiology

## 2022-11-15 DIAGNOSIS — I10 Essential (primary) hypertension: Secondary | ICD-10-CM | POA: Diagnosis not present

## 2022-11-15 DIAGNOSIS — E782 Mixed hyperlipidemia: Secondary | ICD-10-CM | POA: Diagnosis not present

## 2022-11-15 DIAGNOSIS — E1169 Type 2 diabetes mellitus with other specified complication: Secondary | ICD-10-CM | POA: Diagnosis not present

## 2022-11-15 DIAGNOSIS — I4891 Unspecified atrial fibrillation: Secondary | ICD-10-CM | POA: Diagnosis not present

## 2022-11-15 NOTE — Telephone Encounter (Signed)
Pt c/o medication issue:  1. Name of Medication: eliquis  2. How are you currently taking this medication (dosage and times per day)? Not currently taking  3. Are you having a reaction (difficulty breathing--STAT)? no  4. What is your medication issue? Courtney with [facility] calling to follow up on whether he needs to take eliquis. She says the patient is not currently taking it and would like the call back to go to the patient.

## 2022-11-15 NOTE — Telephone Encounter (Signed)
Per last OV 10/22 -   1. Afib - new diagnosis in setting of COVID pneumonia.  - no evidence of recurrence at outpatient f/u or with prior monitor. Appeasrs to have been isoalted in setting of COVID pneumonia. Will d/c eliquis at this time   2. PVCs - chronic, no significant symptoms - continue current meds   3. Preoperative evaluation - considering scrotal surgery - tolerates greater than 4 METs without limitation - we are stopping his eliquis for good at this time - ok to proceed with surgery as planned from cardiac standpoing.

## 2022-11-15 NOTE — Telephone Encounter (Signed)
Left message to return call 

## 2022-11-20 NOTE — Telephone Encounter (Signed)
Left a message for patient to call office back .  

## 2022-11-21 NOTE — Telephone Encounter (Signed)
I forwarded Dr.Branch's note to Dr.Hagler.

## 2022-12-11 ENCOUNTER — Ambulatory Visit (INDEPENDENT_AMBULATORY_CARE_PROVIDER_SITE_OTHER): Payer: Medicare Other | Admitting: Podiatry

## 2022-12-11 ENCOUNTER — Encounter: Payer: Self-pay | Admitting: Podiatry

## 2022-12-11 VITALS — BP 115/63

## 2022-12-11 DIAGNOSIS — M79675 Pain in left toe(s): Secondary | ICD-10-CM

## 2022-12-11 DIAGNOSIS — M79674 Pain in right toe(s): Secondary | ICD-10-CM

## 2022-12-11 DIAGNOSIS — Q828 Other specified congenital malformations of skin: Secondary | ICD-10-CM | POA: Diagnosis not present

## 2022-12-11 DIAGNOSIS — E1142 Type 2 diabetes mellitus with diabetic polyneuropathy: Secondary | ICD-10-CM | POA: Diagnosis not present

## 2022-12-11 DIAGNOSIS — L84 Corns and callosities: Secondary | ICD-10-CM | POA: Diagnosis not present

## 2022-12-11 DIAGNOSIS — B351 Tinea unguium: Secondary | ICD-10-CM | POA: Diagnosis not present

## 2022-12-11 NOTE — Progress Notes (Signed)
  Subjective:  Patient ID: Christopher Sullivan, male    DOB: 04-12-1956,  MRN: [id number]  Christopher Sullivan presents to clinic today for at risk foot care with history of diabetic neuropathy. He has h/o porokeratotic and hyperkeratotic lesions b/l along with painful mycotic toenails. He did see Vascular Surgery and will continue to be followed by them. Chief Complaint  Patient presents with   Nail Problem    DFC BS-did not check today A1C-do not knpw PCP-Hagler PCP VST-09/2022    New problem(s): None.   PCP is [name], MD.  Allergies  Allergen Reactions   Lisinopril Swelling    Review of Systems: Negative except as noted in the HPI.  Objective: No changes noted in today's physical examination. Vitals:   12/11/22 1035  BP: 115/63   Christopher Sullivan is a pleasant 67 y.o. male WD, WN in NAD. AAO x 3.  Vascular Examination: CFT <3 seconds b/l LE. Faintly palpable pedal pulses b/l LE. Pedal hair absent b/l LE. Skin temperature gradient WNL b/l. No pain with calf compression b/l. No edema b/l LE. No cyanosis or clubbing noted b/l LE.  Neurological Examination: Protective sensation decreased with 10 gram monofilament b/l.  Dermatological Examination: Pedal skin with normal turgor, texture and tone b/l. Toenails 1-5 b/l thick, discolored, elongated with subungual debris and pain on dorsal palpation.   Hyperkeratotic lesion(s) sub 5th met base b/l lower extremities.  No erythema, no edema, no drainage, no fluctuance.   Porokeratotic lesion(s) submet head 1 b/l, submet head 4 left foot, and submet head 5 right foot. No erythema, no edema, no drainage, no fluctuance.  Musculoskeletal Examination: Muscle strength 5/5 to b/l LE. Severe hammertoe deformity noted 2-5 bilaterally. Pes planus deformity noted b/l lower extremities.  Radiographs: None  Assessment/Plan: 1. Pain due to onychomycosis of toenails of both feet   2. Porokeratosis   3. Callus   4. Type 2 diabetes mellitus with diabetic  polyneuropathy, without long-term current use of insulin (HCC)     No orders of the defined types were placed in this encounter.  -Examined patient. -He has seen Vascular Surgery and has follow up appointment scheduled for CVI and PAD. -Continue foot and shoe inspections daily. Monitor blood glucose per PCP/Endocrinologist's recommendations. -Continue supportive shoe gear daily. -Mycotic toenails 1-5 bilaterally were debrided in length and girth with sterile nail nippers and dremel without incident. -Callus(es) sub 5th met base b/l lower extremities pared utilizing sterile scalpel blade without complication or incident. Total number debrided =2. -Porokeratotic lesion(s) submet head 1 b/l, submet head 4 left foot, and submet head 5 right foot pared and enucleated with sterile currette without incident. Total number of lesions debrided=4. -Patient/POA to call should there be question/concern in the interim.   Return in about 3 months (around 03/12/2023).  Marzetta Board, DPM

## 2022-12-13 DIAGNOSIS — I10 Essential (primary) hypertension: Secondary | ICD-10-CM | POA: Diagnosis not present

## 2022-12-13 DIAGNOSIS — E1169 Type 2 diabetes mellitus with other specified complication: Secondary | ICD-10-CM | POA: Diagnosis not present

## 2022-12-13 DIAGNOSIS — E782 Mixed hyperlipidemia: Secondary | ICD-10-CM | POA: Diagnosis not present

## 2022-12-13 DIAGNOSIS — I4891 Unspecified atrial fibrillation: Secondary | ICD-10-CM | POA: Diagnosis not present

## 2023-01-01 DIAGNOSIS — E119 Type 2 diabetes mellitus without complications: Secondary | ICD-10-CM | POA: Diagnosis not present

## 2023-01-01 DIAGNOSIS — Z7984 Long term (current) use of oral hypoglycemic drugs: Secondary | ICD-10-CM | POA: Diagnosis not present

## 2023-01-01 DIAGNOSIS — Z961 Presence of intraocular lens: Secondary | ICD-10-CM | POA: Diagnosis not present

## 2023-01-16 ENCOUNTER — Ambulatory Visit: Payer: Medicare Other | Admitting: Vascular Surgery

## 2023-01-16 ENCOUNTER — Ambulatory Visit (HOSPITAL_COMMUNITY): Payer: Medicare Other

## 2023-01-16 DIAGNOSIS — E1169 Type 2 diabetes mellitus with other specified complication: Secondary | ICD-10-CM | POA: Diagnosis not present

## 2023-01-16 DIAGNOSIS — I10 Essential (primary) hypertension: Secondary | ICD-10-CM | POA: Diagnosis not present

## 2023-01-16 DIAGNOSIS — E782 Mixed hyperlipidemia: Secondary | ICD-10-CM | POA: Diagnosis not present

## 2023-01-16 DIAGNOSIS — R8290 Unspecified abnormal findings in urine: Secondary | ICD-10-CM | POA: Diagnosis not present

## 2023-01-16 DIAGNOSIS — E119 Type 2 diabetes mellitus without complications: Secondary | ICD-10-CM | POA: Diagnosis not present

## 2023-01-17 DIAGNOSIS — E782 Mixed hyperlipidemia: Secondary | ICD-10-CM | POA: Diagnosis not present

## 2023-01-17 DIAGNOSIS — E1169 Type 2 diabetes mellitus with other specified complication: Secondary | ICD-10-CM | POA: Diagnosis not present

## 2023-01-17 DIAGNOSIS — I10 Essential (primary) hypertension: Secondary | ICD-10-CM | POA: Diagnosis not present

## 2023-01-17 DIAGNOSIS — I4891 Unspecified atrial fibrillation: Secondary | ICD-10-CM | POA: Diagnosis not present

## 2023-01-21 DIAGNOSIS — Z1211 Encounter for screening for malignant neoplasm of colon: Secondary | ICD-10-CM | POA: Diagnosis not present

## 2023-02-06 ENCOUNTER — Encounter: Payer: Self-pay | Admitting: Internal Medicine

## 2023-02-13 ENCOUNTER — Ambulatory Visit (INDEPENDENT_AMBULATORY_CARE_PROVIDER_SITE_OTHER)
Admission: RE | Admit: 2023-02-13 | Discharge: 2023-02-13 | Disposition: A | Discharge status: Home / Self Care | Payer: Medicare Other | Source: Ambulatory Visit | Attending: Vascular Surgery | Admitting: Vascular Surgery

## 2023-02-13 ENCOUNTER — Ambulatory Visit: Payer: Medicare Other | Admitting: Vascular Surgery

## 2023-02-13 ENCOUNTER — Encounter: Payer: Self-pay | Admitting: Vascular Surgery

## 2023-02-13 ENCOUNTER — Ambulatory Visit (HOSPITAL_COMMUNITY)
Admission: RE | Admit: 2023-02-13 | Discharge: 2023-02-13 | Disposition: A | Discharge status: Home / Self Care | Payer: Medicare Other | Source: Ambulatory Visit | Attending: Vascular Surgery | Admitting: Vascular Surgery

## 2023-02-13 VITALS — BP 128/79 | HR 73 | Temp 97.9°F | Resp 20 | Ht 72.0 in | Wt 231.8 lb

## 2023-02-13 DIAGNOSIS — I872 Venous insufficiency (chronic) (peripheral): Secondary | ICD-10-CM | POA: Insufficient documentation

## 2023-02-13 DIAGNOSIS — I739 Peripheral vascular disease, unspecified: Secondary | ICD-10-CM | POA: Diagnosis not present

## 2023-02-13 LAB — VAS US ABI WITH/WO TBI
Left ABI: 1.33
Right ABI: 1.47

## 2023-02-13 NOTE — Progress Notes (Signed)
Patient ID: Christopher Sullivan, male   DOB: 09/16/1956, 67 y.o.   MRN: [id number]  Reason for Consult: Follow-up   Referred by Christopher Macadam, MD  Subjective:     HPI:  Christopher Sullivan is a 67 y.o. male follows up for evaluation of peripheral febrile disease and currently he does have significant varicose veins in the left lower extremity these do not particularly bother him he does have itching both legs where he has eczema or psoriasis.  He also has joint pain in bilateral knees but denies any claudication.  Past Medical History:  Diagnosis Date   Bilateral primary osteoarthritis of knee    Diabetes mellitus without complication (HCC)    GERD (gastroesophageal reflux disease)    Hypercholesteremia    Hyperlipidemia    Hypertension    Family History  Problem Relation Age of Onset   Hypertension Mother    Diabetes Mother    Arthritis Mother    Heart disease Mother    31 / [place] Mother    Stroke Mother    Depression Father    Mental illness Father    Lupus Sister    Diabetes Daughter    Hypertension Daughter    Colon cancer Neg Hx    Colon polyps Neg Hx    Past Surgical History:  Procedure Laterality Date   CATARACT EXTRACTION     CIRCUMCISION N/A 04/13/2021   Procedure: CIRCUMCISION;  Surgeon: [name], MD;  Location: [facility];  Service: Urology;  Laterality: N/A;   None     SCROTAL EXPLORATION N/A 04/13/2021   Procedure: EXCISION OF SCROTAL LESION;  Surgeon: [name], MD;  Location: [facility];  Service: Urology;  Laterality: N/A;    Short Social History:  Social History   Tobacco Use   Smoking status: Never   Smokeless tobacco: Never  Substance Use Topics   Alcohol use: No    Allergies  Allergen Reactions   Lisinopril Swelling    Current Outpatient Medications  Medication Sig Dispense Refill   atorvastatin (LIPITOR) 20 MG tablet Take 1 tablet (20 mg total) by mouth daily. 90 tablet 3   diltiazem (CARDIZEM CD) 120 MG 24 hr capsule  Take 120 mg by mouth daily.     glipiZIDE (GLUCOTROL XL) 10 MG 24 hr tablet Take 1 tablet (10 mg total) by mouth daily with breakfast. 90 tablet 1   hydrochlorothiazide (HYDRODIURIL) 25 MG tablet Take 25 mg by mouth daily.     metFORMIN (GLUCOPHAGE) 1000 MG tablet Take 1,000 mg by mouth 2 (two) times daily with a meal.      metoprolol succinate (TOPROL-XL) 25 MG 24 hr tablet Take 1 tablet (25 mg total) by mouth daily. 90 tablet 3   sildenafil (VIAGRA) 100 MG tablet 1/2 to 1 tablet p.o. as needed 5 tablet 11   No current facility-administered medications for this visit.    Review of Systems  Constitutional:  Constitutional negative. HENT: HENT negative.  Eyes: Eyes negative.  Respiratory: Respiratory negative.  Cardiovascular: Cardiovascular negative.  GI: Gastrointestinal negative.  Musculoskeletal: Positive for joint pain.  Skin: Skin negative.  Neurological: Neurological negative. Hematologic: Hematologic/lymphatic negative.  Psychiatric: Psychiatric negative.        Objective:  Objective   Vitals:   02/13/23 1155  BP: 128/79  Pulse: 73  Resp: 20  Temp: 97.9 F (36.6 C)  SpO2: 97%  Weight: 231 lb 12.8 oz (105.1 kg)  Height: 6' (1.829 m)   Body mass index is  31.44 kg/m.  Physical Exam HENT:     Head: Normocephalic.     Nose: Nose normal.  Eyes:     Pupils: Pupils are equal, round, and reactive to light.  Cardiovascular:     Rate and Rhythm: Normal rate.  Pulmonary:     Effort: Pulmonary effort is normal.  Abdominal:     General: Abdomen is flat.     Palpations: Abdomen is soft.  Musculoskeletal:        General: Normal range of motion.     Cervical back: Normal range of motion and neck supple.     Right lower leg: Edema present.     Left lower leg: Edema present.  Skin:    Comments: Ble eczema  Neurological:     Mental Status: He is alert.  Psychiatric:        Mood and Affect: Mood normal.        Behavior: Behavior normal.        Thought Content:  Thought content normal.        Judgment: Judgment normal.     Data: ABI Findings:  +---------+------------------+-----+--------+--------+  Right   Rt Pressure (mmHg)IndexWaveformComment   +---------+------------------+-----+--------+--------+  Brachial 121                                      +---------+------------------+-----+--------+--------+  PTA     181               1.47 biphasic          +---------+------------------+-----+--------+--------+  DP      160               1.30 biphasic          +---------+------------------+-----+--------+--------+  Great Toe69                0.56 Abnormal          +---------+------------------+-----+--------+--------+   +---------+------------------+-----+---------+-------+  Left    Lt Pressure (mmHg)IndexWaveform Comment  +---------+------------------+-----+---------+-------+  Brachial 123                                      +---------+------------------+-----+---------+-------+  PTA     164               1.33 triphasic         +---------+------------------+-----+---------+-------+  DP      158               1.28 biphasic          +---------+------------------+-----+---------+-------+  Great Toe74                0.60 Abnormal          +---------+------------------+-----+---------+-------+   +-------+-----------+-----------+------------+------------+  ABI/TBIToday's ABIToday's TBIPrevious ABIPrevious TBI  +-------+-----------+-----------+------------+------------+  Right 1.47       0.56                                 +-------+-----------+-----------+------------+------------+  Left  1.33       0.60                                 +-------+-----------+-----------+------------+------------+    Summary:  Right: Resting right ankle-brachial  index indicates noncompressible right  lower extremity arteries, biphasic waveforms suggest adequate perfusion.  The right  toe-brachial index is abnormal.   Left: Resting left ankle-brachial index indicates noncompressible left  lower extremity arteries. triphasic and biphasic waveforms suggest  adequate perfusion. The left toe-brachial index is abnormal.    LEFT          Reflux NoRefluxReflux TimeDiameter cmsComments                               Yes                                        +--------------+---------+------+-----------+------------+-------------+  CFV                    yes   >1 second                            +--------------+---------+------+-----------+------------+-------------+  FV mid                  yes   >1 second                            +--------------+---------+------+-----------+------------+-------------+  Popliteal              yes   >1 second                            +--------------+---------+------+-----------+------------+-------------+  GSV at SFJ              yes    >500 ms      0.45                   +--------------+---------+------+-----------+------------+-------------+  GSV prox thighno                            0.57                   +--------------+---------+------+-----------+------------+-------------+  GSV mid thigh           yes    >500 ms  0.55 / 0.29 out of fascia  +--------------+---------+------+-----------+------------+-------------+  GSV dist thighno                                    NV             +--------------+---------+------+-----------+------------+-------------+  GSV at knee   no                                    NV             +--------------+---------+------+-----------+------------+-------------+  GSV prox calf no                                    NV             +--------------+---------+------+-----------+------------+-------------+  GSV mid calf            yes    >  500 ms      0.29                    +--------------+---------+------+-----------+------------+-------------+  GSV dist calf no                            0.27                   +--------------+---------+------+-----------+------------+-------------+  SSV Pop Fossa                               0.23                   +--------------+---------+------+-----------+------------+-------------+  SSV prox calf no                            0.23                   +--------------+---------+------+-----------+------------+-------------+  SSV mid calf  no                            0.26                   +--------------+---------+------+-----------+------------+-------------+  AASV o                  yes    >500 ms      0.34                   +--------------+---------+------+-----------+------------+-------------+  AASV p        no                            0.25                   +--------------+---------+------+-----------+------------+-------------+  AASV m        no                            0.25                   +--------------+---------+------+-----------+------------+-------------+         Summary:  Left:  - No evidence of deep vein thrombosis seen in the left lower extremity,  from the common femoral through the popliteal veins.  - No evidence of superficial venous reflux seen in the left short  saphenous vein.  - Venous reflux is noted in the left common femoral vein.  - Venous reflux is noted in the left sapheno-femoral junction.  - Venous reflux is noted in the left greater saphenous vein in the thigh.  - Venous reflux is noted in the left greater saphenous vein in the calf.  - Venous reflux is noted in the left femoral vein.  - Venous reflux is noted in the left popliteal vein.  - Venous reflux is noted in the left perforator vein mid calf diameter  0.50 cm  - Venous reflux noted at the origin of the AASV.      Assessment/Plan:    67 year old male presenting  with bilateral lower extremity pain with skin changes on the left medial ankle consistent with C1 venous disease but only has minimal reflux and I evaluated his vein  personally at bedside today which is out of the fascia throughout most of the thigh and then gives rise to multiple varicosities but these are not painful.  For this left recommended conservative measures with continued compression therapy.  He does have bilateral joint pain which appears to be not related to peripheral vascular disease or venous insufficiency.  As such no intervention is recommended he can follow up with me on an as-needed basis.     Waynetta Sandy MD [facility]

## 2023-02-19 DIAGNOSIS — I10 Essential (primary) hypertension: Secondary | ICD-10-CM | POA: Diagnosis not present

## 2023-02-19 DIAGNOSIS — I4891 Unspecified atrial fibrillation: Secondary | ICD-10-CM | POA: Diagnosis not present

## 2023-02-19 DIAGNOSIS — E1169 Type 2 diabetes mellitus with other specified complication: Secondary | ICD-10-CM | POA: Diagnosis not present

## 2023-02-19 DIAGNOSIS — E782 Mixed hyperlipidemia: Secondary | ICD-10-CM | POA: Diagnosis not present

## 2023-03-09 NOTE — Progress Notes (Deleted)
Referring Provider:[facility] Primary Care Physician:  Aliene Beams, MD Primary Gastroenterologist:  Dr. Bonnetta Barry chief complaint on file.   HPI:   Christopher Sullivan is a 67 y.o. male presenting today at the request of [facility] for positive FIT.   Prior colonoscopy:  Fhx colon cancer or polyps:     Past Medical History:  Diagnosis Date   Bilateral primary osteoarthritis of knee    Diabetes mellitus without complication (HCC)    GERD (gastroesophageal reflux disease)    Hypercholesteremia    Hyperlipidemia    Hypertension     Past Surgical History:  Procedure Laterality Date   CATARACT EXTRACTION     CIRCUMCISION N/A 04/13/2021   Procedure: CIRCUMCISION;  Surgeon: Malen Gauze, MD;  Location: [facility];  Service: Urology;  Laterality: N/A;   None     SCROTAL EXPLORATION N/A 04/13/2021   Procedure: EXCISION OF SCROTAL LESION;  Surgeon: Malen Gauze, MD;  Location: [facility];  Service: Urology;  Laterality: N/A;    Current Outpatient Medications  Medication Sig Dispense Refill   atorvastatin (LIPITOR) 20 MG tablet Take 1 tablet (20 mg total) by mouth daily. 90 tablet 3   diltiazem (CARDIZEM CD) 120 MG 24 hr capsule Take 120 mg by mouth daily.     glipiZIDE (GLUCOTROL XL) 10 MG 24 hr tablet Take 1 tablet (10 mg total) by mouth daily with breakfast. 90 tablet 1   hydrochlorothiazide (HYDRODIURIL) 25 MG tablet Take 25 mg by mouth daily.     metFORMIN (GLUCOPHAGE) 1000 MG tablet Take 1,000 mg by mouth 2 (two) times daily with a meal.      metoprolol succinate (TOPROL-XL) 25 MG 24 hr tablet Take 1 tablet (25 mg total) by mouth daily. 90 tablet 3   sildenafil (VIAGRA) 100 MG tablet 1/2 to 1 tablet p.o. as needed 5 tablet 11   No current facility-administered medications for this visit.    Allergies as of 03/11/2023 - Review Complete 02/13/2023  Allergen Reaction Noted   Lisinopril Swelling 11/25/2019    Family History  Problem Relation Age of Onset    Hypertension Mother    Diabetes Mother    Arthritis Mother    Heart disease Mother    Miscarriages / [place] Mother    Stroke Mother    Depression Father    Mental illness Father    Lupus Sister    Diabetes Daughter    Hypertension Daughter    Colon cancer Neg Hx    Colon polyps Neg Hx     Social History   Socioeconomic History   Marital status: Married    Spouse name: Not on file   Number of children: 1   Years of education: Not on file   Highest education level: Not on file  Occupational History   Occupation: retired  Tobacco Use   Smoking status: Never   Smokeless tobacco: Never  Vaping Use   Vaping Use: Never used  Substance and Sexual Activity   Alcohol use: No   Drug use: No   Sexual activity: Not on file  Other Topics Concern   Not on file  Social History Narrative   Married. Works at [employer]. Third shift.    Two children/stepson and daughter.    Social Determinants of Health   Financial Resource Strain: Not on file  Food Insecurity: Not on file  Transportation Needs: Not on file  Physical Activity: Not on file  Stress: Not on  file  Social Connections: Not on file  Intimate Partner Violence: Not on file    Review of Systems: Gen: Denies any fever, chills, fatigue, weight loss, lack of appetite.  CV: Denies chest pain, heart palpitations, peripheral edema, syncope.  Resp: Denies shortness of breath at rest or with exertion. Denies wheezing or cough.  GI: Denies dysphagia or odynophagia. Denies jaundice, hematemesis, fecal incontinence. GU : Denies urinary burning, urinary frequency, urinary hesitancy MS: Denies joint pain, muscle weakness, cramps, or limitation of movement.  Derm: Denies rash, itching, dry skin Psych: Denies depression, anxiety, memory loss, and confusion Heme: Denies bruising, bleeding, and enlarged lymph nodes.  Physical Exam: There were no vitals taken for this visit. General:   Alert and oriented. Pleasant and  cooperative. Well-nourished and well-developed.  Head:  Normocephalic and atraumatic. Eyes:  Without icterus, sclera clear and conjunctiva pink.  Ears:  Normal auditory acuity. Lungs:  Clear to auscultation bilaterally. No wheezes, rales, or rhonchi. No distress.  Heart:  S1, S2 present without murmurs appreciated.  Abdomen:  +BS, soft, non-tender and non-distended. No HSM noted. No guarding or rebound. No masses appreciated.  Rectal:  Deferred  Msk:  Symmetrical without gross deformities. Normal posture. Extremities:  Without edema. Neurologic:  Alert and  oriented x4;  grossly normal neurologically. Skin:  Intact without significant lesions or rashes. Psych:  Alert and cooperative. Normal mood and affect.    Assessment:     Plan:  ***   Ermalinda Memos, PA-C [facility] 03/11/2023

## 2023-03-11 ENCOUNTER — Ambulatory Visit: Payer: Medicare Other | Admitting: Gastroenterology

## 2023-03-11 ENCOUNTER — Encounter: Payer: Self-pay | Admitting: Gastroenterology

## 2023-03-20 ENCOUNTER — Ambulatory Visit (INDEPENDENT_AMBULATORY_CARE_PROVIDER_SITE_OTHER): Payer: Medicare Other | Admitting: Podiatry

## 2023-03-20 DIAGNOSIS — B351 Tinea unguium: Secondary | ICD-10-CM | POA: Diagnosis not present

## 2023-03-20 DIAGNOSIS — E1142 Type 2 diabetes mellitus with diabetic polyneuropathy: Secondary | ICD-10-CM | POA: Diagnosis not present

## 2023-03-20 DIAGNOSIS — M79674 Pain in right toe(s): Secondary | ICD-10-CM | POA: Diagnosis not present

## 2023-03-20 DIAGNOSIS — M79675 Pain in left toe(s): Secondary | ICD-10-CM | POA: Diagnosis not present

## 2023-03-20 NOTE — Progress Notes (Signed)
  Subjective:  Patient ID: Christopher Sullivan, male    DOB: 1956-01-19,  MRN: [id number]  Christopher Sullivan presents to clinic today for at risk foot care with history of diabetic neuropathy. He has h/o porokeratotic and hyperkeratotic lesions b/l along with painful mycotic toenails. He did see Vascular Surgery and will continue to be followed by them. Chief Complaint  Patient presents with   Nail Problem    Nail trim    New problem(s): None.   PCP is Aliene Beams, MD.  Allergies  Allergen Reactions   Lisinopril Swelling    Review of Systems: Negative except as noted in the HPI.  Objective: No changes noted in today's physical examination. There were no vitals filed for this visit.  Christopher Sullivan is a pleasant 67 y.o. male WD, WN in NAD. AAO x 3.  Vascular Examination: CFT <3 seconds b/l LE. Faintly palpable pedal pulses b/l LE. Pedal hair absent b/l LE. Skin temperature gradient WNL b/l. No pain with calf compression b/l. No edema b/l LE. No cyanosis or clubbing noted b/l LE.  Neurological Examination: Protective sensation decreased with 10 gram monofilament b/l.  Dermatological Examination: Pedal skin with normal turgor, texture and tone b/l. Toenails 1-5 b/l thick, discolored, elongated with subungual debris and pain on dorsal palpation.   Hyperkeratotic lesion(s) sub 5th met base b/l lower extremities.  No erythema, no edema, no drainage, no fluctuance.   Porokeratotic lesion(s) submet head 1 b/l, submet head 4 left foot, and submet head 5 right foot. No erythema, no edema, no drainage, no fluctuance.  Musculoskeletal Examination: Muscle strength 5/5 to b/l LE. Severe hammertoe deformity noted 2-5 bilaterally. Pes planus deformity noted b/l lower extremities.  Radiographs: None  Assessment/Plan: No diagnosis found.   No orders of the defined types were placed in this encounter.  -Examined patient. -He has seen Vascular Surgery and has follow up appointment scheduled for CVI and  PAD. -Continue foot and shoe inspections daily. Monitor blood glucose per PCP/Endocrinologist's recommendations. -Continue supportive shoe gear daily. -Mycotic toenails 1-5 bilaterally were debrided in length and girth with sterile nail nippers and dremel without incident. -Callus(es) sub 5th met base b/l lower extremities pared utilizing sterile scalpel blade without complication or incident. Total number debrided =2. -Porokeratotic lesion(s) submet head 1 b/l, submet head 4 left foot, and submet head 5 right foot pared and enucleated with sterile currette without incident. Total number of lesions debrided=4. -Patient/POA to call should there be question/concern in the interim.   No follow-ups on file.  Candelaria Stagers, DPM

## 2023-03-26 DIAGNOSIS — R8290 Unspecified abnormal findings in urine: Secondary | ICD-10-CM | POA: Diagnosis not present

## 2023-03-26 DIAGNOSIS — Z87440 Personal history of urinary (tract) infections: Secondary | ICD-10-CM | POA: Diagnosis not present

## 2023-03-26 DIAGNOSIS — Z0000 Encounter for general adult medical examination without abnormal findings: Secondary | ICD-10-CM | POA: Diagnosis not present

## 2023-03-26 DIAGNOSIS — Z9181 History of falling: Secondary | ICD-10-CM | POA: Diagnosis not present

## 2023-03-26 DIAGNOSIS — Z23 Encounter for immunization: Secondary | ICD-10-CM | POA: Diagnosis not present

## 2023-03-26 DIAGNOSIS — R195 Other fecal abnormalities: Secondary | ICD-10-CM | POA: Diagnosis not present

## 2023-03-26 DIAGNOSIS — Z79899 Other long term (current) drug therapy: Secondary | ICD-10-CM | POA: Diagnosis not present

## 2023-03-26 DIAGNOSIS — I10 Essential (primary) hypertension: Secondary | ICD-10-CM | POA: Diagnosis not present

## 2023-03-26 DIAGNOSIS — E782 Mixed hyperlipidemia: Secondary | ICD-10-CM | POA: Diagnosis not present

## 2023-03-26 DIAGNOSIS — E1142 Type 2 diabetes mellitus with diabetic polyneuropathy: Secondary | ICD-10-CM | POA: Diagnosis not present

## 2023-03-26 DIAGNOSIS — I872 Venous insufficiency (chronic) (peripheral): Secondary | ICD-10-CM | POA: Diagnosis not present

## 2023-04-02 ENCOUNTER — Encounter: Payer: Self-pay | Admitting: *Deleted

## 2023-04-02 DIAGNOSIS — E538 Deficiency of other specified B group vitamins: Secondary | ICD-10-CM | POA: Diagnosis not present

## 2023-04-08 ENCOUNTER — Other Ambulatory Visit: Payer: Medicare Other

## 2023-04-09 DIAGNOSIS — E538 Deficiency of other specified B group vitamins: Secondary | ICD-10-CM | POA: Diagnosis not present

## 2023-04-13 NOTE — Progress Notes (Unsigned)
Referring Provider: Aliene Beams, MD Primary Care Physician:  Aliene Beams, MD Primary Gastroenterologist:  Dr. Jena Gauss  Chief Complaint  Patient presents with   positive FIt     Blood in stool.    HPI:   Christopher Sullivan is a 67 y.o. male presenting today at the request of Aliene Beams, MD for positive FIT.    Fit test was positive 01/21/2023.  Occasional low volume toilet tissue hematochezia. Started a few months ago. Occurring 1-2 days out of a month. Has associated burning at that time.  No known hemorrhoids.  No hard stools or constipation. No knifelike rectal pain. No diarrhea, abdominal pain, weight loss.   Prior colonoscopy: Never Fhx colon cancer or polyps: No  Rare heartburn. No dysphagia, nausea, or vomiting.   Past Medical History:  Diagnosis Date   Bilateral primary osteoarthritis of knee    Diabetes mellitus without complication (HCC)    GERD (gastroesophageal reflux disease)    Hypercholesteremia    Hyperlipidemia    Hypertension     Past Surgical History:  Procedure Laterality Date   CATARACT EXTRACTION     CIRCUMCISION N/A 04/13/2021   Procedure: CIRCUMCISION;  Surgeon: Malen Gauze, MD;  Location: [facility];  Service: Urology;  Laterality: N/A;   SCROTAL EXPLORATION N/A 04/13/2021   Procedure: EXCISION OF SCROTAL LESION;  Surgeon: Malen Gauze, MD;  Location: [facility];  Service: Urology;  Laterality: N/A;    Current Outpatient Medications  Medication Sig Dispense Refill   aspirin EC 81 MG tablet Take 81 mg by mouth daily. Swallow whole.     atorvastatin (LIPITOR) 20 MG tablet Take 1 tablet (20 mg total) by mouth daily. 90 tablet 3   Blood Glucose Calibration (ONETOUCH VERIO) High LIQD      diltiazem (CARDIZEM CD) 120 MG 24 hr capsule Take 120 mg by mouth daily.     glipiZIDE (GLUCOTROL XL) 10 MG 24 hr tablet Take 1 tablet (10 mg total) by mouth daily with breakfast. 90 tablet 1   hydrochlorothiazide (HYDRODIURIL) 25 MG tablet Take 25 mg by  mouth daily.     Lancet Devices (SIMPLE DIAGNOSTICS LANCING DEV) MISC Apply topically.     metFORMIN (GLUCOPHAGE) 1000 MG tablet Take 1,000 mg by mouth 2 (two) times daily with a meal.      metoprolol succinate (TOPROL-XL) 25 MG 24 hr tablet Take 1 tablet (25 mg total) by mouth daily. 90 tablet 3   Semaglutide (RYBELSUS) 14 MG TABS Take 14 mg by mouth daily.     sildenafil (VIAGRA) 100 MG tablet 1/2 to 1 tablet p.o. as needed 5 tablet 11   No current facility-administered medications for this visit.    Allergies as of 04/15/2023 - Review Complete 04/15/2023  Allergen Reaction Noted   Lisinopril Swelling 11/25/2019    Family History  Problem Relation Age of Onset   Hypertension Mother    Diabetes Mother    Arthritis Mother    Heart disease Mother    Miscarriages / [place] Mother    Stroke Mother    Depression Father    Mental illness Father    Lupus Sister    Diabetes Daughter    Hypertension Daughter    Colon cancer Neg Hx    Colon polyps Neg Hx    Cancer - Colon Neg Hx     Social History   Socioeconomic History   Marital status: Married    Spouse name: Not on file   Number of children:  1   Years of education: Not on file   Highest education level: Not on file  Occupational History   Occupation: retired  Tobacco Use   Smoking status: Never   Smokeless tobacco: Never  Vaping Use   Vaping Use: Never used  Substance and Sexual Activity   Alcohol use: No   Drug use: No   Sexual activity: Not on file  Other Topics Concern   Not on file  Social History Narrative   Married. Works at [employer]. Third shift.    Two children/stepson and daughter.    Social Determinants of Health   Financial Resource Strain: Not on file  Food Insecurity: Not on file  Transportation Needs: Not on file  Physical Activity: Not on file  Stress: Not on file  Social Connections: Not on file  Intimate Partner Violence: Not on file    Review of Systems: Gen: Denies any  fever, chills, cold or flulike symptoms, presyncope, syncope. CV: Denies chest pain, heart palpitations. Resp: Denies shortness of breath, cough. GI: See HPI GU : Denies urinary burning, urinary frequency, urinary hesitancy MS: Denies joint pain. Derm: Denies rash. Psych: Denies depression, anxiety. Heme: See HPI  Physical Exam: BP 132/79 (BP Location: Left Arm, Patient Position: Sitting, Cuff Size: Large)   Pulse 97   Temp 98 F (36.7 C) (Temporal)   Ht 5\' 8"  (1.727 m)   Wt 232 lb 9.6 oz (105.5 kg)   SpO2 98%   BMI 35.37 kg/m  General:   Alert and oriented. Pleasant and cooperative. Well-nourished and well-developed.  Head:  Normocephalic and atraumatic. Eyes:  Without icterus, sclera clear and conjunctiva pink.  Ears:  Normal auditory acuity. Lungs:  Clear to auscultation bilaterally. No wheezes, rales, or rhonchi. No distress.  Heart:  S1, S2 present without murmurs appreciated.  Abdomen:  +BS, soft, non-tender and non-distended. No HSM noted. No guarding or rebound. No masses appreciated.  Rectal:  Deferred  Msk:  Symmetrical without gross deformities. Normal posture. Extremities:  Without edema. Neurologic:  Alert and  oriented x4;  grossly normal neurologically. Skin:  Intact without significant lesions or rashes. Psych:  Normal mood and affect.    Assessment:  67 year old male with history of HTN, HLD, type 2 diabetes, osteoarthritis, GERD, presenting today at the request of Aliene Beams, MD for positive FIT completed 01/21/2023.  Patient reports few month history of occasional scant toilet tissue hematochezia with associated rectal burning, but this only occurs 1-2 days out of a month.  No other significant GI symptoms.  No prior colonoscopy.  No family history of colon polyps or colon cancer.  Positive FIT, intermittent dilatation hematochezia, rectal burning likely secondary to hemorrhoids, but unable to rule out colon polyps or malignancy.  Plan:  Proceed with  colonoscopy with propofol by Dr. Jena Gauss in near future. The risks, benefits, and alternatives have been discussed with the patient in detail. The patient states understanding and desires to proceed.  ASA 2 1 day prior to procedure: Hold Rybelsus, one half dose of metformin, take glipizide as prescribed. Day of procedure: No morning diabetes medications. BMP prior to colonoscopy.  Follow-up after colonoscopy as Dr. Jena Gauss recommends.   Ermalinda Memos, PA-C [facility]enter[facility] 04/15/2023

## 2023-04-15 ENCOUNTER — Encounter: Payer: Self-pay | Admitting: Gastroenterology

## 2023-04-15 ENCOUNTER — Ambulatory Visit: Payer: Medicare Other | Admitting: Gastroenterology

## 2023-04-15 VITALS — BP 132/79 | HR 97 | Temp 98.0°F | Ht 68.0 in | Wt 232.6 lb

## 2023-04-15 DIAGNOSIS — R195 Other fecal abnormalities: Secondary | ICD-10-CM

## 2023-04-15 DIAGNOSIS — K625 Hemorrhage of anus and rectum: Secondary | ICD-10-CM | POA: Diagnosis not present

## 2023-04-15 NOTE — Patient Instructions (Addendum)
We will arrange for you to have a colonoscopy in the near future with Dr. Jena Gauss. 1 day prior to your procedure: Do not take Rybelsus, take one half dose of metformin (500 mg twice daily), take glipizide as prescribed. Day of procedure: Do not take any morning diabetes medications.  We will follow-up with you after your colonoscopy as Dr. Jena Gauss recommends.  It was nice to meet you today!  Ermalinda Memos, PA-C [facility] Gastroenter[facility]

## 2023-04-16 DIAGNOSIS — E538 Deficiency of other specified B group vitamins: Secondary | ICD-10-CM | POA: Diagnosis not present

## 2023-04-17 ENCOUNTER — Telehealth: Payer: Self-pay | Admitting: *Deleted

## 2023-04-17 DIAGNOSIS — K625 Hemorrhage of anus and rectum: Secondary | ICD-10-CM

## 2023-04-17 DIAGNOSIS — R195 Other fecal abnormalities: Secondary | ICD-10-CM

## 2023-04-17 NOTE — Telephone Encounter (Signed)
[facility]  TCS w/Dr. Jena Gauss, asa 2

## 2023-04-23 ENCOUNTER — Telehealth: Payer: Self-pay | Admitting: Gastroenterology

## 2023-04-23 DIAGNOSIS — N390 Urinary tract infection, site not specified: Secondary | ICD-10-CM | POA: Diagnosis not present

## 2023-04-23 DIAGNOSIS — E538 Deficiency of other specified B group vitamins: Secondary | ICD-10-CM | POA: Diagnosis not present

## 2023-04-23 NOTE — Telephone Encounter (Signed)
Received labs from PCP dated 03/26/2023. BMP within normal limits. CBC within normal limits.  Hemoglobin 14.7.  No additional recommendations at this time.

## 2023-04-24 ENCOUNTER — Encounter: Payer: Self-pay | Admitting: *Deleted

## 2023-04-24 NOTE — Telephone Encounter (Signed)
Mailed letter °

## 2023-04-29 DIAGNOSIS — R3912 Poor urinary stream: Secondary | ICD-10-CM | POA: Diagnosis not present

## 2023-04-29 DIAGNOSIS — N3020 Other chronic cystitis without hematuria: Secondary | ICD-10-CM | POA: Diagnosis not present

## 2023-04-29 DIAGNOSIS — R3914 Feeling of incomplete bladder emptying: Secondary | ICD-10-CM | POA: Diagnosis not present

## 2023-04-29 DIAGNOSIS — N200 Calculus of kidney: Secondary | ICD-10-CM | POA: Diagnosis not present

## 2023-04-29 MED ORDER — PEG 3350-KCL-NA BICARB-NACL 420 G PO SOLR: 4000.0000 mL | Freq: Once | ORAL | 0 refills | Status: AC

## 2023-04-29 NOTE — Telephone Encounter (Signed)
Pt and spouse returned call. Scheduled for 7/1. Aware will send instructions. Rx for prep sent to pharmacy.    Per [facility] "Notification or Prior Authorization is not required for the requested services You are not required to submit a notification/prior authorization based on the information provided. If you have general questions about the prior authorization requirements, visit [email or web address] > Clinician Resources > Advance and Admission Notification Requirements. The number above acknowledges your notification. Please write this reference number down for future reference. If you would like to request an organization determination, please call us at [phone]. Decision ID #: [id number]"

## 2023-04-29 NOTE — Addendum Note (Signed)
Addended by: Armstead Peaks on: 04/29/2023 10:15 AM   Modules accepted: Orders

## 2023-05-27 ENCOUNTER — Ambulatory Visit (HOSPITAL_COMMUNITY): Payer: Medicare Other | Admitting: Certified Registered"

## 2023-05-27 ENCOUNTER — Ambulatory Visit (HOSPITAL_COMMUNITY)
Admission: RE | Admit: 2023-05-27 | Discharge: 2023-05-27 | Disposition: A | Discharge status: Home / Self Care | Payer: Medicare Other | Attending: Internal Medicine | Admitting: Internal Medicine

## 2023-05-27 ENCOUNTER — Ambulatory Visit (HOSPITAL_BASED_OUTPATIENT_CLINIC_OR_DEPARTMENT_OTHER): Payer: Medicare Other | Admitting: Certified Registered"

## 2023-05-27 ENCOUNTER — Other Ambulatory Visit: Payer: Self-pay

## 2023-05-27 ENCOUNTER — Encounter (HOSPITAL_COMMUNITY)
Admission: RE | Disposition: A | Discharge status: Home / Self Care | Payer: Self-pay | Source: Home / Self Care | Attending: Internal Medicine

## 2023-05-27 DIAGNOSIS — D125 Benign neoplasm of sigmoid colon: Secondary | ICD-10-CM | POA: Diagnosis not present

## 2023-05-27 DIAGNOSIS — I10 Essential (primary) hypertension: Secondary | ICD-10-CM | POA: Insufficient documentation

## 2023-05-27 DIAGNOSIS — K635 Polyp of colon: Secondary | ICD-10-CM | POA: Diagnosis not present

## 2023-05-27 DIAGNOSIS — E7800 Pure hypercholesterolemia, unspecified: Secondary | ICD-10-CM | POA: Diagnosis not present

## 2023-05-27 DIAGNOSIS — D120 Benign neoplasm of cecum: Secondary | ICD-10-CM | POA: Insufficient documentation

## 2023-05-27 DIAGNOSIS — E119 Type 2 diabetes mellitus without complications: Secondary | ICD-10-CM | POA: Diagnosis not present

## 2023-05-27 DIAGNOSIS — D126 Benign neoplasm of colon, unspecified: Secondary | ICD-10-CM | POA: Diagnosis not present

## 2023-05-27 DIAGNOSIS — Z1211 Encounter for screening for malignant neoplasm of colon: Secondary | ICD-10-CM | POA: Diagnosis not present

## 2023-05-27 DIAGNOSIS — K921 Melena: Secondary | ICD-10-CM | POA: Diagnosis not present

## 2023-05-27 DIAGNOSIS — K5730 Diverticulosis of large intestine without perforation or abscess without bleeding: Secondary | ICD-10-CM | POA: Insufficient documentation

## 2023-05-27 DIAGNOSIS — K5790 Diverticulosis of intestine, part unspecified, without perforation or abscess without bleeding: Secondary | ICD-10-CM

## 2023-05-27 DIAGNOSIS — Z7984 Long term (current) use of oral hypoglycemic drugs: Secondary | ICD-10-CM | POA: Diagnosis not present

## 2023-05-27 HISTORY — PX: COLONOSCOPY WITH PROPOFOL: SHX5780

## 2023-05-27 HISTORY — PX: ENDOSCOPIC MUCOSAL RESECTION: SHX6839

## 2023-05-27 HISTORY — PX: POLYPECTOMY: SHX5525

## 2023-05-27 HISTORY — PX: SUBMUCOSAL LIFTING INJECTION: SHX6855

## 2023-05-27 LAB — GLUCOSE, CAPILLARY: Glucose-Capillary: 101 mg/dL — ABNORMAL HIGH (ref 70–99)

## 2023-05-27 SURGERY — COLONOSCOPY WITH PROPOFOL
Anesthesia: General

## 2023-05-27 MED ORDER — PROPOFOL 10 MG/ML IV BOLUS
INTRAVENOUS | Status: DC | PRN
  Administered 2023-05-27: 100 mg via INTRAVENOUS
  Administered 2023-05-27: 40 mg via INTRAVENOUS

## 2023-05-27 MED ORDER — LACTATED RINGERS IV SOLN: INTRAVENOUS | Status: DC

## 2023-05-27 MED ORDER — LIDOCAINE HCL (CARDIAC) PF 100 MG/5ML IV SOSY
PREFILLED_SYRINGE | INTRAVENOUS | Status: DC | PRN
  Administered 2023-05-27: 50 mg via INTRAVENOUS

## 2023-05-27 MED ORDER — PROPOFOL 500 MG/50ML IV EMUL
INTRAVENOUS | Status: DC | PRN
  Administered 2023-05-27: 150 ug/kg/min via INTRAVENOUS

## 2023-05-27 NOTE — Anesthesia Procedure Notes (Signed)
Date/Time: 05/27/2023 9:36 AM  Performed by: Julian Reil, CRNAPre-anesthesia Checklist: Patient identified, Emergency Drugs available, Suction available and Patient being monitored Patient Re-evaluated:Patient Re-evaluated prior to induction Oxygen Delivery Method: Nasal cannula Induction Type: IV induction Placement Confirmation: positive ETCO2

## 2023-05-27 NOTE — Transfer of Care (Signed)
Immediate Anesthesia Transfer of Care Note  Patient: Christopher Sullivan  Procedure(s) Performed: COLONOSCOPY WITH PROPOFOL SUBMUCOSAL LIFTING INJECTION POLYPECTOMY ENDOSCOPIC MUCOSAL RESECTION  Patient Location: Endoscopy Unit  Anesthesia Type:General  Level of Consciousness: drowsy  Airway & Oxygen Therapy: Patient Spontanous Breathing  Post-op Assessment: Report given to RN and Post -op Vital signs reviewed and stable  Post vital signs: Reviewed and stable  Last Vitals:  Vitals Value Taken Time  BP    Temp    Pulse    Resp    SpO2      Last Pain:  Vitals:   05/27/23 0927  TempSrc:   PainSc: 0-No pain      Patients Stated Pain Goal: 8 (05/27/23 0814)  Complications: No notable events documented.

## 2023-05-27 NOTE — Anesthesia Preprocedure Evaluation (Signed)
Anesthesia Evaluation  Patient identified by MRN, date of birth, ID band Patient awake    Reviewed: Allergy & Precautions, H&P , NPO status , Patient's Chart, lab work & pertinent test results, reviewed documented beta blocker date and time   Airway Mallampati: II  TM Distance: >3 FB Neck ROM: full    Dental no notable dental hx.    Pulmonary neg pulmonary ROS, pneumonia   Pulmonary exam normal breath sounds clear to auscultation       Cardiovascular Exercise Tolerance: Good hypertension, negative cardio ROS  Rhythm:regular Rate:Normal     Neuro/Psych negative neurological ROS  negative psych ROS   GI/Hepatic negative GI ROS, Neg liver ROS,GERD  ,,  Endo/Other  negative endocrine ROSdiabetes    Renal/GU negative Renal ROS  negative genitourinary   Musculoskeletal   Abdominal   Peds  Hematology negative hematology ROS (+)   Anesthesia Other Findings   Reproductive/Obstetrics negative OB ROS                             Anesthesia Physical Anesthesia Plan  ASA: 2  Anesthesia Plan: General   Post-op Pain Management:    Induction:   PONV Risk Score and Plan: Propofol infusion  Airway Management Planned:   Additional Equipment:   Intra-op Plan:   Post-operative Plan:   Informed Consent: I have reviewed the patients History and Physical, chart, labs and discussed the procedure including the risks, benefits and alternatives for the proposed anesthesia with the patient or authorized representative who has indicated his/her understanding and acceptance.     Dental Advisory Given  Plan Discussed with: CRNA  Anesthesia Plan Comments:        Anesthesia Quick Evaluation

## 2023-05-27 NOTE — H&P (Signed)
@LOGO @   Primary Care Physician:  Aliene Beams, MD Primary Gastroenterologist:  Dr. Jena Gauss  Pre-Procedure History & Physical: HPI:  Christopher Sullivan is a 67 y.o. male here for further evaluation of positive FIT and paper hematochezia.  No prior colonoscopy.  Denies abdominal pain no melena.  Past Medical History:  Diagnosis Date   Bilateral primary osteoarthritis of knee    Diabetes mellitus without complication (HCC)    GERD (gastroesophageal reflux disease)    Hypercholesteremia    Hyperlipidemia    Hypertension     Past Surgical History:  Procedure Laterality Date   CATARACT EXTRACTION     CIRCUMCISION N/A 04/13/2021   Procedure: CIRCUMCISION;  Surgeon: Malen Gauze, MD;  Location: [facility];  Service: Urology;  Laterality: N/A;   SCROTAL EXPLORATION N/A 04/13/2021   Procedure: EXCISION OF SCROTAL LESION;  Surgeon: Malen Gauze, MD;  Location: [facility];  Service: Urology;  Laterality: N/A;    Prior to Admission medications   Medication Sig Start Date End Date Taking? Authorizing Provider  ampicillin (PRINCIPEN) 500 MG capsule Take 500 mg by mouth 4 (four) times daily. 05/01/23  Yes [provider]  aspirin EC 81 MG tablet Take 81 mg by mouth daily. Swallow whole.   Yes [provider]  Blood Glucose Calibration (ONETOUCH VERIO) High LIQD  02/18/23  Yes [provider]  dapagliflozin propanediol (FARXIGA) 10 MG TABS tablet Take 10 mg by mouth daily.   Yes [provider]  diltiazem (CARDIZEM CD) 120 MG 24 hr capsule Take 120 mg by mouth daily.   Yes [provider]  glipiZIDE (GLUCOTROL XL) 10 MG 24 hr tablet Take 1 tablet (10 mg total) by mouth daily with breakfast. 08/18/18  Yes Hagler, Fleet Contras, MD  hydrochlorothiazide (HYDRODIURIL) 25 MG tablet Take 25 mg by mouth daily.   Yes [provider]  Lancet Devices (SIMPLE DIAGNOSTICS LANCING DEV) MISC Apply topically. 02/18/23  Yes [provider]  metFORMIN  (GLUCOPHAGE) 1000 MG tablet Take 1,000 mg by mouth 2 (two) times daily with a meal.    Yes [provider]  metoprolol succinate (TOPROL-XL) 25 MG 24 hr tablet Take 1 tablet (25 mg total) by mouth daily. 08/18/18  Yes Hagler, Fleet Contras, MD  rosuvastatin (CRESTOR) 20 MG tablet Take 20 mg by mouth at bedtime. 05/14/23  Yes [provider]  Semaglutide 7 MG TABS Take 7 mg by mouth every morning.   Yes [provider]  sildenafil (VIAGRA) 100 MG tablet 1/2 to 1 tablet p.o. as needed 06/27/21   Marcine Matar, MD    Allergies as of 04/29/2023 - Review Complete 04/15/2023  Allergen Reaction Noted   Lisinopril Swelling 11/25/2019    Family History  Problem Relation Age of Onset   Hypertension Mother    Diabetes Mother    Arthritis Mother    Heart disease Mother    Miscarriages / [place] Mother    Stroke Mother    Depression Father    Mental illness Father    Lupus Sister    Diabetes Daughter    Hypertension Daughter    Colon cancer Neg Hx    Colon polyps Neg Hx    Cancer - Colon Neg Hx     Social History   Socioeconomic History   Marital status: Married    Spouse name: Not on file   Number of children: 1   Years of education: Not on file   Highest education level: Not on file  Occupational  History   Occupation: retired  Tobacco Use   Smoking status: Never   Smokeless tobacco: Never  Vaping Use   Vaping Use: Never used  Substance and Sexual Activity   Alcohol use: No   Drug use: No   Sexual activity: Not on file  Other Topics Concern   Not on file  Social History Narrative   Married. Works at [employer]. Third shift.    Two children/stepson and daughter.    Social Determinants of Health   Financial Resource Strain: Not on file  Food Insecurity: Not on file  Transportation Needs: Not on file  Physical Activity: Not on file  Stress: Not on file  Social Connections: Not on file  Intimate Partner Violence: Not on file    Review of  Systems: See HPI, otherwise negative ROS  Physical Exam: BP 122/69   Pulse 70   Temp 97.9 F (36.6 C) (Oral)   Resp 15   Ht 5\' 8"  (1.727 m)   Wt 105.2 kg   SpO2 94%   BMI 35.28 kg/m  General:   Alert,  Well-developed, well-nourished, pleasant and cooperative in NAD denopathy. Lungs:  Clear throughout to auscultation.   No wheezes, crackles, or rhonchi. No acute distress. Heart:  Regular rate and rhythm; no murmurs, clicks, rubs,  or gallops. Abdomen: Non-distended, normal bowel sounds.  Soft and nontender without appreciable mass or hepatosplenomegaly.    Impression/Plan: 67 year old gentleman presents with paper hematochezia and a positive stool for occult blood.  Colonoscopy today per plan.  No prior colonoscopy. The risks, benefits, limitations, alternatives and imponderables have been reviewed with the patient. Questions have been answered. All parties are agreeable.       Notice: This dictation was prepared with Dragon dictation along with smaller phrase technology. Any transcriptional errors that result from this process are unintentional and may not be corrected upon review.

## 2023-05-27 NOTE — Op Note (Signed)
[facility] Patient Name: Christopher Sullivan Procedure Date: 05/27/2023 9:08 AM MRN: [id number] Date of Birth: 08-18-1956 Attending MD: Gennette Pac , MD, [id number] CSN: [id number] Age: 67 Admit Type: Outpatient Procedure:                Colonoscopy Indications:              Heme positive stool Providers:                Gennette Pac, MD, Buel Ream. Thomasena Edis RN, RN,                            [employer], [employer]. Jessee Avers, Technician,                            Dyann Ruddle Referring MD:              Medicines:                Propofol per Anesthesia Complications:            No immediate complications. Estimated Blood Loss:     Estimated blood loss was minimal. Procedure:                Pre-Anesthesia Assessment:                           - Prior to the procedure, a History and Physical                            was performed, and patient medications and                            allergies were reviewed. The patient's tolerance of                            previous anesthesia was also reviewed. The risks                            and benefits of the procedure and the sedation                            options and risks were discussed with the patient.                            All questions were answered, and informed consent                            was obtained. Prior Anticoagulants: The patient has                            taken no anticoagulant or antiplatelet agents. ASA                            Grade Assessment: II - A patient with mild systemic  disease. After reviewing the risks and benefits,                            the patient was deemed in satisfactory condition to                            undergo the procedure.                           After obtaining informed consent, the colonoscope                            was passed under direct vision. Throughout the                            procedure, the patient's  blood pressure, pulse, and                            oxygen saturations were monitored continuously. The                            [id number]) scope was introduced through the                            anus and advanced to the the cecum, identified by                            appendiceal orifice and ileocecal valve. The                            colonoscopy was performed without difficulty. The                            patient tolerated the procedure well. The quality                            of the bowel preparation was adequate. The                            ileocecal valve, appendiceal orifice, and rectum                            were photographed. Scope In: 9:31:28 AM Scope Out: 10:00:24 AM Scope Withdrawal Time: 0 hours 25 minutes 58 seconds  Total Procedure Duration: 0 hours 28 minutes 56 seconds  Findings:      The perianal and digital rectal examinations were normal.      Scattered small-mouthed diverticula were found in the entire colon.      A 30x25 mm polyp was found in the ileocecal valve. The polyp was       semi-pedunculated. This was on the distal side of the ileocecal valve.       It was lifted nicely with 5 cc of Ella view. This brought to the       proximal side and a good view. This did not extend into the  terminal       ileum the total extent was seen. Subsequently, piecemeal hot snare       polypectomy ensued. This lesion was felt to have been removed       completely. Multiple specimen submitted to the pathologist      The exam was otherwise without abnormality on direct and retroflexion       views. Impression:               - Diverticulosis in the entire examined colon.                           - One 30 mm polyp at the ileocecal valve.                           - The examination was otherwise normal on direct                            and retroflexion views.                           - No specimens collected. Moderate Sedation:       Moderate (conscious) sedation was personally administered by an       anesthesia professional. The following parameters were monitored: oxygen       saturation, heart rate, blood pressure, respiratory rate, EKG, adequacy       of pulmonary ventilation, and response to care. Recommendation:           - Patient has a contact number available for                            emergencies. The signs and symptoms of potential                            delayed complications were discussed with the                            patient. Return to normal activities tomorrow.                            Written discharge instructions were provided to the                            patient.                           - Advance diet as tolerated.                           - Continue present medications.                           - Repeat colonoscopy date to be determined after                            pending pathology results are reviewed for  surveillance.                           - Return to GI office in 3 months. Procedure Code(s):        --- Professional ---                           [phone], Colonoscopy, flexible; diagnostic, including                            collection of specimen(s) by brushing or washing,                            when performed (separate procedure) Diagnosis Code(s):        --- Professional ---                           D12.0, Benign neoplasm of cecum                           R19.5, Other fecal abnormalities                           K57.30, Diverticulosis of large intestine without                            perforation or abscess without bleeding CPT copyright 2022 American [employer] Association. All rights reserved. The codes documented in this report are preliminary and upon coder review may  be revised to meet current compliance requirements. Gerrit Friends. Luis Sami, MD Gennette Pac, MD 05/27/2023 10:20:44 AM This report has been signed  electronically. Number of Addenda: 0

## 2023-05-27 NOTE — Discharge Instructions (Addendum)
  Colonoscopy Discharge Instructions  Read the instructions outlined below and refer to this sheet in the next few weeks. These discharge instructions provide you with general information on caring for yourself after you leave the hospital. Your doctor may also give you specific instructions. While your treatment has been planned according to the most current medical practices available, unavoidable complications occasionally occur. If you have any problems or questions after discharge, call Dr. Jena Gauss at [phone]. ACTIVITY You may resume your regular activity, but move at a slower pace for the next 24 hours.  Take frequent rest periods for the next 24 hours.  Walking will help get rid of the air and reduce the bloated feeling in your belly (abdomen).  No driving for 24 hours (because of the medicine (anesthesia) used during the test).   Do not sign any important legal documents or operate any machinery for 24 hours (because of the anesthesia used during the test).  NUTRITION Drink plenty of fluids.  You may resume your normal diet as instructed by your doctor.  Begin with a light meal and progress to your normal diet. Heavy or fried foods are harder to digest and may make you feel sick to your stomach (nauseated).  Avoid alcoholic beverages for 24 hours or as instructed.  MEDICATIONS You may resume your normal medications unless your doctor tells you otherwise.  WHAT YOU CAN EXPECT TODAY Some feelings of bloating in the abdomen.  Passage of more gas than usual.  Spotting of blood in your stool or on the toilet paper.  IF YOU HAD POLYPS REMOVED DURING THE COLONOSCOPY: No aspirin products for 7 days or as instructed.  No alcohol for 7 days or as instructed.  Eat a soft diet for the next 24 hours.  FINDING OUT THE RESULTS OF YOUR TEST Not all test results are available during your visit. If your test results are not back during the visit, make an appointment with your caregiver to find out the  results. Do not assume everything is normal if you have not heard from your caregiver or the medical facility. It is important for you to follow up on all of your test results.  SEEK IMMEDIATE MEDICAL ATTENTION IF: You have more than a spotting of blood in your stool.  Your belly is swollen (abdominal distention).  You are nauseated or vomiting.  You have a temperature over 101.  You have abdominal pain or discomfort that is severe or gets worse throughout the day.     a large polyp was removed from your colon today  Colon polyp diverticulosis and hemorrhoid information provided   further recommendations to follow pending review of pathology report colon polyp  At patient request, I called Latonya at [phone] -  reviewed findings and recommendations   office visit with [place] in 3 months  OFFICE WILL CALL WITH APPOINTMENT

## 2023-05-28 LAB — SURGICAL PATHOLOGY

## 2023-05-29 ENCOUNTER — Encounter: Payer: Self-pay | Admitting: Internal Medicine

## 2023-05-30 NOTE — Anesthesia Postprocedure Evaluation (Signed)
Anesthesia Post Note  Patient: Christopher Sullivan  Procedure(s) Performed: COLONOSCOPY WITH PROPOFOL SUBMUCOSAL LIFTING INJECTION POLYPECTOMY ENDOSCOPIC MUCOSAL RESECTION  Patient location during evaluation: Phase II Anesthesia Type: General Level of consciousness: awake Pain management: pain level controlled Vital Signs Assessment: post-procedure vital signs reviewed and stable Respiratory status: spontaneous breathing and respiratory function stable Cardiovascular status: blood pressure returned to baseline and stable Postop Assessment: no headache and no apparent nausea or vomiting Anesthetic complications: no Comments: Late entry   No notable events documented.   Last Vitals:  Vitals:   05/27/23 0814 05/27/23 1003  BP: 122/69 117/73  Pulse: 70 73  Resp: 15 19  Temp: 36.6 C (!) 36.4 C  SpO2: 94% 97%    Last Pain:  Vitals:   05/27/23 1004  TempSrc:   PainSc: 0-No pain                 Windell Norfolk

## 2023-06-04 ENCOUNTER — Encounter (HOSPITAL_COMMUNITY): Payer: Self-pay | Admitting: Internal Medicine

## 2023-06-13 DIAGNOSIS — R21 Rash and other nonspecific skin eruption: Secondary | ICD-10-CM | POA: Diagnosis not present

## 2023-06-13 DIAGNOSIS — E119 Type 2 diabetes mellitus without complications: Secondary | ICD-10-CM | POA: Diagnosis not present

## 2023-06-19 ENCOUNTER — Ambulatory Visit: Payer: Medicare Other

## 2023-06-19 ENCOUNTER — Ambulatory Visit: Payer: Medicare Other | Admitting: Podiatry

## 2023-06-19 DIAGNOSIS — M79675 Pain in left toe(s): Secondary | ICD-10-CM | POA: Diagnosis not present

## 2023-06-19 DIAGNOSIS — M79674 Pain in right toe(s): Secondary | ICD-10-CM

## 2023-06-19 DIAGNOSIS — E1142 Type 2 diabetes mellitus with diabetic polyneuropathy: Secondary | ICD-10-CM | POA: Diagnosis not present

## 2023-06-19 DIAGNOSIS — B351 Tinea unguium: Secondary | ICD-10-CM | POA: Diagnosis not present

## 2023-06-19 NOTE — Progress Notes (Signed)
  Subjective:  Patient ID: Christopher Sullivan, male    DOB: [birth year],  MRN: [id number]  Christopher Sullivan presents to clinic today for at risk foot care with history of diabetic neuropathy. He has h/o porokeratotic and hyperkeratotic lesions b/l along with painful mycotic toenails. He did see Vascular Surgery and will continue to be followed by them. No chief complaint on file.  New problem(s): None.   PCP is Aliene Beams, MD.  Allergies  Allergen Reactions   Lisinopril Swelling    Review of Systems: Negative except as noted in the HPI.  Objective: No changes noted in today's physical examination. There were no vitals filed for this visit.  Christopher Sullivan is a pleasant 67 y.o. male WD, WN in NAD. AAO x 3.  Vascular Examination: CFT <3 seconds b/l LE. Faintly palpable pedal pulses b/l LE. Pedal hair absent b/l LE. Skin temperature gradient WNL b/l. No pain with calf compression b/l. No edema b/l LE. No cyanosis or clubbing noted b/l LE.  Neurological Examination: Protective sensation decreased with 10 gram monofilament b/l.  Dermatological Examination: Pedal skin with normal turgor, texture and tone b/l. Toenails 1-5 b/l thick, discolored, elongated with subungual debris and pain on dorsal palpation.   Hyperkeratotic lesion(s) sub 5th met base b/l lower extremities.  No erythema, no edema, no drainage, no fluctuance.   Porokeratotic lesion(s) submet head 1 b/l, submet head 4 left foot, and submet head 5 right foot. No erythema, no edema, no drainage, no fluctuance.  Musculoskeletal Examination: Muscle strength 5/5 to b/l LE. Severe hammertoe deformity noted 2-5 bilaterally. Pes planus deformity noted b/l lower extremities.  Radiographs: None  Assessment/Plan: No diagnosis found.   No orders of the defined types were placed in this encounter.  -Examined patient. -He has seen Vascular Surgery and has follow up appointment scheduled for CVI and PAD. -Continue foot and shoe inspections daily.  Monitor blood glucose per PCP/Endocrinologist's recommendations. -Continue supportive shoe gear daily. -Mycotic toenails 1-5 bilaterally were debrided in length and girth with sterile nail nippers and dremel without incident. -Callus(es) sub 5th met base b/l lower extremities pared utilizing sterile scalpel blade without complication or incident. Total number debrided =2. -Porokeratotic lesion(s) submet head 1 b/l, submet head 4 left foot, and submet head 5 right foot pared and enucleated with sterile currette without incident. Total number of lesions debrided=4. -Patient/POA to call should there be question/concern in the interim.   No follow-ups on file.  Candelaria Stagers, DPM

## 2023-06-20 NOTE — Progress Notes (Signed)
Patient was present and selected new set of shoes will call when in  Order placed - Christopher Sullivan Cped, CFo, CFm

## 2023-06-28 ENCOUNTER — Ambulatory Visit (INDEPENDENT_AMBULATORY_CARE_PROVIDER_SITE_OTHER): Payer: Medicare Other

## 2023-06-28 DIAGNOSIS — M2142 Flat foot [pes planus] (acquired), left foot: Secondary | ICD-10-CM

## 2023-06-28 DIAGNOSIS — M2041 Other hammer toe(s) (acquired), right foot: Secondary | ICD-10-CM

## 2023-06-28 DIAGNOSIS — M2042 Other hammer toe(s) (acquired), left foot: Secondary | ICD-10-CM | POA: Diagnosis not present

## 2023-06-28 DIAGNOSIS — M2141 Flat foot [pes planus] (acquired), right foot: Secondary | ICD-10-CM

## 2023-06-28 DIAGNOSIS — L84 Corns and callosities: Secondary | ICD-10-CM

## 2023-06-28 DIAGNOSIS — E1142 Type 2 diabetes mellitus with diabetic polyneuropathy: Secondary | ICD-10-CM | POA: Diagnosis not present

## 2023-06-28 NOTE — Progress Notes (Signed)

## 2023-07-09 DIAGNOSIS — L301 Dyshidrosis [pompholyx]: Secondary | ICD-10-CM | POA: Diagnosis not present

## 2023-07-09 DIAGNOSIS — E119 Type 2 diabetes mellitus without complications: Secondary | ICD-10-CM | POA: Diagnosis not present

## 2023-07-23 ENCOUNTER — Encounter: Payer: Self-pay | Admitting: Internal Medicine

## 2023-07-31 DIAGNOSIS — B353 Tinea pedis: Secondary | ICD-10-CM | POA: Diagnosis not present

## 2023-07-31 DIAGNOSIS — S30861A Insect bite (nonvenomous) of abdominal wall, initial encounter: Secondary | ICD-10-CM | POA: Diagnosis not present

## 2023-07-31 DIAGNOSIS — L309 Dermatitis, unspecified: Secondary | ICD-10-CM | POA: Diagnosis not present

## 2023-09-13 DIAGNOSIS — E782 Mixed hyperlipidemia: Secondary | ICD-10-CM | POA: Diagnosis not present

## 2023-09-13 DIAGNOSIS — I10 Essential (primary) hypertension: Secondary | ICD-10-CM | POA: Diagnosis not present

## 2023-09-13 DIAGNOSIS — Z23 Encounter for immunization: Secondary | ICD-10-CM | POA: Diagnosis not present

## 2023-09-13 DIAGNOSIS — E1159 Type 2 diabetes mellitus with other circulatory complications: Secondary | ICD-10-CM | POA: Diagnosis not present

## 2023-09-13 DIAGNOSIS — E119 Type 2 diabetes mellitus without complications: Secondary | ICD-10-CM | POA: Diagnosis not present

## 2023-09-13 DIAGNOSIS — E538 Deficiency of other specified B group vitamins: Secondary | ICD-10-CM | POA: Diagnosis not present

## 2023-09-13 DIAGNOSIS — L309 Dermatitis, unspecified: Secondary | ICD-10-CM | POA: Diagnosis not present

## 2023-09-20 ENCOUNTER — Ambulatory Visit: Payer: Medicare Other | Admitting: Podiatry

## 2023-10-04 ENCOUNTER — Ambulatory Visit (INDEPENDENT_AMBULATORY_CARE_PROVIDER_SITE_OTHER): Payer: Medicare Other | Admitting: Podiatry

## 2023-10-04 ENCOUNTER — Encounter: Payer: Self-pay | Admitting: Podiatry

## 2023-10-04 DIAGNOSIS — B351 Tinea unguium: Secondary | ICD-10-CM | POA: Diagnosis not present

## 2023-10-04 DIAGNOSIS — M79675 Pain in left toe(s): Secondary | ICD-10-CM | POA: Diagnosis not present

## 2023-10-04 DIAGNOSIS — M79674 Pain in right toe(s): Secondary | ICD-10-CM | POA: Diagnosis not present

## 2023-10-04 DIAGNOSIS — E1142 Type 2 diabetes mellitus with diabetic polyneuropathy: Secondary | ICD-10-CM

## 2023-10-04 MED ORDER — CLOTRIMAZOLE-BETAMETHASONE 1-0.05 % EX CREA: 1.0000 | TOPICAL_CREAM | Freq: Every day | CUTANEOUS | 0 refills | Status: AC

## 2023-10-04 NOTE — Progress Notes (Signed)
  Subjective:  Patient ID: Christopher Sullivan, male    DOB: 06-08-1956,  MRN: [id number]  Christopher Sullivan presents to clinic today for at risk foot care with history of diabetic neuropathy. He has h/o porokeratotic and hyperkeratotic lesions b/l along with painful mycotic toenails. He did see Vascular Surgery and will continue to be followed by them. Chief Complaint  Patient presents with   [facility]    [facility]   New problem(s): None.   PCP is Aliene Beams, MD.  Allergies  Allergen Reactions   Lisinopril Swelling    Review of Systems: Negative except as noted in the HPI.  Objective: No changes noted in today's physical examination. There were no vitals filed for this visit.  Christopher Sullivan is a pleasant 67 y.o. male WD, WN in NAD. AAO x 3.  Vascular Examination: CFT <3 seconds b/l LE. Faintly palpable pedal pulses b/l LE. Pedal hair absent b/l LE. Skin temperature gradient WNL b/l. No pain with calf compression b/l. No edema b/l LE. No cyanosis or clubbing noted b/l LE.  Neurological Examination: Protective sensation decreased with 10 gram monofilament b/l.  Dermatological Examination: Pedal skin with normal turgor, texture and tone b/l. Toenails 1-5 b/l thick, discolored, elongated with subungual debris and pain on dorsal palpation.   Hyperkeratotic lesion(s) sub 5th met base b/l lower extremities.  No erythema, no edema, no drainage, no fluctuance.   Porokeratotic lesion(s) submet head 1 b/l, submet head 4 left foot, and submet head 5 right foot. No erythema, no edema, no drainage, no fluctuance.  Musculoskeletal Examination: Muscle strength 5/5 to b/l LE. Severe hammertoe deformity noted 2-5 bilaterally. Pes planus deformity noted b/l lower extremities.  Radiographs: None  Assessment/Plan: No diagnosis found.   Meds ordered this encounter  Medications   clotrimazole-betamethasone (LOTRISONE) cream    Sig: Apply 1 Application topically daily.    Dispense:  30 g    Refill:  0   -Examined  patient. -He has seen Vascular Surgery and has follow up appointment scheduled for CVI and PAD. -Continue foot and shoe inspections daily. Monitor blood glucose per PCP/Endocrinologist's recommendations. -Continue supportive shoe gear daily. -Mycotic toenails 1-5 bilaterally were debrided in length and girth with sterile nail nippers and dremel without incident. -Callus(es) sub 5th met base b/l lower extremities pared utilizing sterile scalpel blade without complication or incident. Total number debrided =2. -Porokeratotic lesion(s) submet head 1 b/l, submet head 4 left foot, and submet head 5 right foot pared and enucleated with sterile currette without incident. Total number of lesions debrided=4. -Patient/POA to call should there be question/concern in the interim.   No follow-ups on file.  Candelaria Stagers, DPM

## 2023-11-12 ENCOUNTER — Encounter (INDEPENDENT_AMBULATORY_CARE_PROVIDER_SITE_OTHER): Payer: Self-pay | Admitting: *Deleted

## 2024-01-13 ENCOUNTER — Telehealth: Payer: Self-pay | Admitting: *Deleted

## 2024-01-13 NOTE — Telephone Encounter (Signed)
  Procedure: COLONOSCOPY  Estimated body mass index is 35.28 kg/m as calculated from the following:   Height as of 05/27/23: 5\' 8"  (1.727 m).   Weight as of 05/27/23: 232 lb (105.2 kg).  Have you had a colonoscopy before?  YES  Do you have family history of colon cancer?  NO  Do you have a family history of polyps? NO  Previous colonoscopy with polyps removed? YES  Do you have a history colorectal cancer?   NO  Are you diabetic?  YES   Do you have a prosthetic or mechanical heart valve? NO  Do you have a pacemaker/defibrillator?   NO  Have you had endocarditis/atrial fibrillation?  NO  Do you use supplemental oxygen/CPAP?  NO  Have you had joint replacement within the last 12 months?  NO  Do you tend to be constipated or have to use laxatives?  NO   Do you have history of alcohol use? If yes, how much and how often.  NO  Do you have history or are you using drugs? If yes, what do are you  using?  NO  Have you ever had a stroke/heart attack?  NO  Have you ever had a heart or other vascular stent placed,?NO  Do you take weight loss medication? NO  Do you take any blood-thinning medications such as: (Plavix, aspirin, Coumadin, Aggrenox, Brilinta, Xarelto, Eliquis, Pradaxa, Savaysa or Effient)? NO  If yes we need the name, milligram, dosage and who is prescribing doctor:               Current Outpatient Medications  Medication Sig Dispense Refill   ampicillin (PRINCIPEN) 500 MG capsule Take 500 mg by mouth 4 (four) times daily.     aspirin EC 81 MG tablet Take 81 mg by mouth daily. Swallow whole.     Blood Glucose Calibration (ONETOUCH VERIO) High LIQD      clotrimazole-betamethasone (LOTRISONE) cream Apply 1 Application topically daily. 30 g 0   dapagliflozin propanediol (FARXIGA) 10 MG TABS tablet Take 10 mg by mouth daily.     diltiazem (CARDIZEM CD) 120 MG 24 hr capsule Take 120 mg by mouth daily.     glipiZIDE (GLUCOTROL XL) 10 MG 24 hr tablet Take 1 tablet (10 mg  total) by mouth daily with breakfast. 90 tablet 1   hydrochlorothiazide (HYDRODIURIL) 25 MG tablet Take 25 mg by mouth daily.     Lancet Devices (SIMPLE DIAGNOSTICS LANCING DEV) MISC Apply topically.     metFORMIN (GLUCOPHAGE) 1000 MG tablet Take 1,000 mg by mouth 2 (two) times daily with a meal.      metoprolol succinate (TOPROL-XL) 25 MG 24 hr tablet Take 1 tablet (25 mg total) by mouth daily. 90 tablet 3   rosuvastatin (CRESTOR) 20 MG tablet Take 20 mg by mouth at bedtime.     Semaglutide 7 MG TABS Take 7 mg by mouth every morning.     sildenafil (VIAGRA) 100 MG tablet 1/2 to 1 tablet p.o. as needed 5 tablet 11   No current facility-administered medications for this visit.    Allergies  Allergen Reactions   Lisinopril Swelling

## 2024-01-28 NOTE — Telephone Encounter (Signed)
LMOVM to call back to schedule 

## 2024-01-28 NOTE — Telephone Encounter (Signed)
 ASA 2  Hold farxiga 3 days prior One day prior to procedure: Hold Rybelsus, one half dose of metformin, take glipizide as prescribed. Day of procedure: No metformin, rybelsus, or glipizide BMP prior to colonoscopy.

## 2024-02-04 ENCOUNTER — Encounter: Payer: Self-pay | Admitting: *Deleted

## 2024-02-04 ENCOUNTER — Other Ambulatory Visit: Payer: Self-pay | Admitting: *Deleted

## 2024-02-04 DIAGNOSIS — Z860100 Personal history of colon polyps, unspecified: Secondary | ICD-10-CM

## 2024-02-04 MED ORDER — PEG 3350-KCL-NA BICARB-NACL 420 G PO SOLR
4000.0000 mL | Freq: Once | ORAL | 0 refills | Status: AC
Start: 2024-02-04 — End: 2024-02-04

## 2024-02-04 NOTE — Telephone Encounter (Signed)
 Pt has been scheduled for 03/16/24 with Dr.Rourk. Instructions mailed and prep sent to the pharamcy

## 2024-02-05 ENCOUNTER — Ambulatory Visit: Payer: Medicare Other | Admitting: Podiatry

## 2024-02-05 ENCOUNTER — Encounter: Payer: Self-pay | Admitting: Podiatry

## 2024-02-05 VITALS — Ht 68.0 in | Wt 232.0 lb

## 2024-02-05 DIAGNOSIS — B351 Tinea unguium: Secondary | ICD-10-CM | POA: Diagnosis not present

## 2024-02-05 DIAGNOSIS — M2041 Other hammer toe(s) (acquired), right foot: Secondary | ICD-10-CM | POA: Diagnosis not present

## 2024-02-05 DIAGNOSIS — E1142 Type 2 diabetes mellitus with diabetic polyneuropathy: Secondary | ICD-10-CM

## 2024-02-05 DIAGNOSIS — L84 Corns and callosities: Secondary | ICD-10-CM | POA: Diagnosis not present

## 2024-02-05 DIAGNOSIS — Z0189 Encounter for other specified special examinations: Secondary | ICD-10-CM

## 2024-02-05 DIAGNOSIS — M79674 Pain in right toe(s): Secondary | ICD-10-CM

## 2024-02-05 DIAGNOSIS — E119 Type 2 diabetes mellitus without complications: Secondary | ICD-10-CM

## 2024-02-05 DIAGNOSIS — M2141 Flat foot [pes planus] (acquired), right foot: Secondary | ICD-10-CM | POA: Diagnosis not present

## 2024-02-05 DIAGNOSIS — M2142 Flat foot [pes planus] (acquired), left foot: Secondary | ICD-10-CM

## 2024-02-05 DIAGNOSIS — M2042 Other hammer toe(s) (acquired), left foot: Secondary | ICD-10-CM

## 2024-02-05 DIAGNOSIS — M79675 Pain in left toe(s): Secondary | ICD-10-CM | POA: Diagnosis not present

## 2024-02-05 NOTE — Progress Notes (Signed)
 ANNUAL DIABETIC FOOT EXAM  Subjective: Christopher Sullivan presents today for annual diabetic foot exam. Chief Complaint  Patient presents with   Nail Problem    Pt is here for [facility] A1C was 8.2 PCP is Dr Tracie Harrier and LOV was in October.   Patient confirms h/o diabetes.  Patient denies any h/o foot wounds.  Patient has been diagnosed with neuropathy.  Aliene Beams, MD is patient's PCP.  Past Medical History:  Diagnosis Date   Bilateral primary osteoarthritis of knee    Diabetes mellitus without complication (HCC)    GERD (gastroesophageal reflux disease)    Hypercholesteremia    Hyperlipidemia    Hypertension    Patient Active Problem List   Diagnosis Date Noted   Rectal bleeding 04/15/2023   Positive FIT (fecal immunochemical test) 04/15/2023   Benign prostatic hyperplasia 09/15/2020   Nocturia 09/15/2020   Scrotal lesion 09/15/2020   Lab test positive for detection of COVID-19 virus 11/26/2019   Pneumonia due to COVID-19 virus 11/26/2019   Hypokalemia 11/26/2019   Hyperbilirubinemia 11/26/2019   Hypoalbuminemia 11/26/2019   Elevated d-dimer 11/26/2019   Uncontrolled type 2 diabetes mellitus 11/26/2019   Elevated ferritin 11/26/2019   Hyponatremia 11/26/2019   AF (paroxysmal atrial fibrillation) (HCC) 11/26/2019   Failure to thrive in adult 11/25/2019   Bilateral primary osteoarthritis of knee 02/12/2019   Angioedema 12/12/2018   Dysphagia 03/13/2018   Encounter for screening colonoscopy 03/13/2018   Essential hypertension 02/11/2018   Type 2 diabetes mellitus without complication, without long-term current use of insulin (HCC) 12/11/2017   Hyperlipidemia associated with type 2 diabetes mellitus (HCC) 12/11/2017   Past Surgical History:  Procedure Laterality Date   CATARACT EXTRACTION     CIRCUMCISION N/A 04/13/2021   Procedure: CIRCUMCISION;  Surgeon: Malen Gauze, MD;  Location: [facility];  Service: Urology;  Laterality: N/A;   COLONOSCOPY WITH PROPOFOL N/A  05/27/2023   Procedure: COLONOSCOPY WITH PROPOFOL;  Surgeon: Corbin Ade, MD;  Location: [facility];  Service: Endoscopy;  Laterality: N/A;  930am, asa 2   ENDOSCOPIC MUCOSAL RESECTION  05/27/2023   Procedure: ENDOSCOPIC MUCOSAL RESECTION;  Surgeon: Corbin Ade, MD;  Location: [facility];  Service: Endoscopy;;   POLYPECTOMY  05/27/2023   Procedure: POLYPECTOMY;  Surgeon: Corbin Ade, MD;  Location: [facility];  Service: Endoscopy;;   SCROTAL EXPLORATION N/A 04/13/2021   Procedure: EXCISION OF SCROTAL LESION;  Surgeon: Malen Gauze, MD;  Location: [facility];  Service: Urology;  Laterality: N/A;   SUBMUCOSAL LIFTING INJECTION  05/27/2023   Procedure: SUBMUCOSAL LIFTING INJECTION;  Surgeon: Corbin Ade, MD;  Location: [facility];  Service: Endoscopy;;   Current Outpatient Medications on File Prior to Visit  Medication Sig Dispense Refill   ampicillin (PRINCIPEN) 500 MG capsule Take 500 mg by mouth 4 (four) times daily.     aspirin EC 81 MG tablet Take 81 mg by mouth daily. Swallow whole.     Blood Glucose Calibration (ONETOUCH VERIO) High LIQD      clotrimazole-betamethasone (LOTRISONE) cream Apply 1 Application topically daily. 30 g 0   dapagliflozin propanediol (FARXIGA) 10 MG TABS tablet Take 10 mg by mouth daily.     diltiazem (CARDIZEM CD) 120 MG 24 hr capsule Take 120 mg by mouth daily.     glipiZIDE (GLUCOTROL XL) 10 MG 24 hr tablet Take 1 tablet (10 mg total) by mouth daily with breakfast. 90 tablet 1   hydrochlorothiazide (HYDRODIURIL) 25 MG tablet  Take 25 mg by mouth daily.     Lancet Devices (SIMPLE DIAGNOSTICS LANCING DEV) MISC Apply topically.     metFORMIN (GLUCOPHAGE) 1000 MG tablet Take 1,000 mg by mouth 2 (two) times daily with a meal.      metoprolol succinate (TOPROL-XL) 25 MG 24 hr tablet Take 1 tablet (25 mg total) by mouth daily. 90 tablet 3   rosuvastatin (CRESTOR) 20 MG tablet Take 20 mg by mouth at bedtime.     Semaglutide 7 MG TABS Take 7 mg by  mouth every morning.     sildenafil (VIAGRA) 100 MG tablet 1/2 to 1 tablet p.o. as needed 5 tablet 11   No current facility-administered medications on file prior to visit.    Allergies  Allergen Reactions   Lisinopril Swelling   Social History   Occupational History   Occupation: retired  Tobacco Use   Smoking status: Never   Smokeless tobacco: Never  Vaping Use   Vaping status: Never Used  Substance and Sexual Activity   Alcohol use: No   Drug use: No   Sexual activity: Not on file   Family History  Problem Relation Age of Onset   Hypertension Mother    Diabetes Mother    Arthritis Mother    Heart disease Mother    Miscarriages / [place] Mother    Stroke Mother    Depression Father    Mental illness Father    Lupus Sister    Diabetes Daughter    Hypertension Daughter    Colon cancer Neg Hx    Colon polyps Neg Hx    Cancer - Colon Neg Hx    Immunization History  Administered Date(s) Administered   Influenza,inj,Quad PF,6+ Mos 09/03/2017, 08/18/2018   Pneumococcal Conjugate-13 02/11/2018   Pneumococcal Polysaccharide-23 08/18/2018   Tdap 12/27/2018     Review of Systems: Negative except as noted in the HPI.   Objective: There were no vitals filed for this visit.  Christopher Sullivan is a pleasant 68 y.o. male in NAD. AAO X 3.  Diabetic foot exam was performed with the following findings:   Vascular Examination: CFT <3 seconds b/l. DP/PT pulses faintly palpable b/l. Skin temperature gradient warm to warm b/l. No pain with calf compression. No ischemia or gangrene. No cyanosis or clubbing noted b/l. No edema noted b/l LE.   Neurological Examination: Protective sensation decreased with 10 gram monofilament b/l.  Dermatological Examination: Pedal skin warm and supple b/l.   No open wounds. No interdigital macerations.  Toenails 1-5 b/l thick, discolored, elongated with subungual debris and pain on dorsal palpation.    Hyperkeratotic lesion(s) submet head 1  left foot, submet head 5 left foot, and sub 5th met base left foot.  No erythema, no edema, no drainage, no fluctuance.  Musculoskeletal Examination: Muscle strength 5/5 to all lower extremity muscle groups bilaterally. Hammertoe deformity noted 2-5 b/l. Pes planus deformity noted bilateral LE.  Radiographs: None     Lab Results  Component Value Date   HGBA1C 11.1 (H) 04/11/2021   ADA Risk Categorization: High Risk  Patient has one or more of the following: Loss of protective sensation Absent pedal pulses Severe Foot deformity History of foot ulcer  Assessment: 1. Pain due to onychomycosis of toenails of both feet   2. Callus   3. Type 2 diabetes mellitus with diabetic polyneuropathy, without long-term current use of insulin (HCC)   4. Pes planus of both feet   5. Acquired hammertoes of both feet  6. Encounter for diabetic foot exam (HCC)     Plan: Diabetic foot examination performed today. All patient's and/or POA's questions/concerns addressed on today's visit. Toenails 1-5 debrided in length and girth without incident. Callus(es) submet head 1 left foot, submet head 5 left foot, and sub 5th met base left foot pared with sharp debridement without incident. Continue daily foot inspections and monitor blood glucose per PCP/Endocrinologist's recommendations.Continue soft, supportive shoe gear daily. Report any pedal injuries to medical professional. Call office if there are any questions/concerns. -Patient/POA to call should there be question/concern in the interim. Return in about 3 months (around 05/07/2024).  Freddie Breech, DPM      [place] LOCATION: [address], [place] [address]                   Office [phone]   [facility] LOCATION: [address] [place], [place] [address] Office [phone]

## 2024-02-06 NOTE — Telephone Encounter (Signed)
 Questionnaire from recall, no referral needed

## 2024-02-18 DIAGNOSIS — B86 Scabies: Secondary | ICD-10-CM | POA: Diagnosis not present

## 2024-03-16 ENCOUNTER — Encounter (HOSPITAL_COMMUNITY): Admission: RE | Payer: Self-pay | Source: Home / Self Care

## 2024-03-16 ENCOUNTER — Telehealth: Payer: Self-pay

## 2024-03-16 ENCOUNTER — Encounter: Payer: Self-pay | Admitting: *Deleted

## 2024-03-16 ENCOUNTER — Ambulatory Visit (HOSPITAL_COMMUNITY): Admission: RE | Admit: 2024-03-16 | Source: Home / Self Care | Admitting: Internal Medicine

## 2024-03-16 ENCOUNTER — Other Ambulatory Visit: Payer: Self-pay | Admitting: *Deleted

## 2024-03-16 SURGERY — COLONOSCOPY
Anesthesia: Choice

## 2024-03-16 MED ORDER — PEG 3350-KCL-NA BICARB-NACL 420 G PO SOLR: 4000.0000 mL | Freq: Once | ORAL | 0 refills | Status: AC

## 2024-03-16 NOTE — Telephone Encounter (Signed)
 Noted. Can we reach out to the patient to see why he no showed and if he wants to reschedule?

## 2024-03-16 NOTE — Telephone Encounter (Signed)
Attempted to contact pt, unable to leave message.

## 2024-03-16 NOTE — Telephone Encounter (Signed)
 Pt thought his procedure was for tomorrow 03/17/24 instead of today . He has already mixed his prep. Pt has been rescheduled for 03/30/24. Updated instructions mailed and prep sent to pharmacy.

## 2024-03-16 NOTE — Telephone Encounter (Signed)
 Pt was a no show for his procedure this morning. Last seen Shana Daring 04/15/2023. Phone triaged by schedulers. Schedulers were advised.

## 2024-03-16 NOTE — Progress Notes (Signed)
 03/17/2024 7:41 PM   Christopher Sullivan 06-02-1956 [id number]  Referring provider: Dorena Gander, MD [phone] [address] [place],  [place] [address]  No chief complaint on file.   HPI: 68 yo male sent by Dr Barbar Bonus for E/M of elevated PSA. His last PSA was on 10.18.2024 and was 3.16 (no other PSA data available).  He has been seen here before for circumcision and excision of a large scrotal condylomata by Dr. Claretta Croft.  He is having frequency urgency, some leakage as well as foul-smelling urine.  He has been on 2 courses of an antibiotic over the past several months.  No gross hematuria, no pneumaturia.    PMH: Past Medical History:  Diagnosis Date   Bilateral primary osteoarthritis of knee    Diabetes mellitus without complication (HCC)    GERD (gastroesophageal reflux disease)    Hypercholesteremia    Hyperlipidemia    Hypertension     Surgical History: Past Surgical History:  Procedure Laterality Date   CATARACT EXTRACTION     CIRCUMCISION N/A 04/13/2021   Procedure: CIRCUMCISION;  Surgeon: Marco Severs, MD;  Location: [facility];  Service: Urology;  Laterality: N/A;   COLONOSCOPY WITH PROPOFOL  N/A 05/27/2023   Procedure: COLONOSCOPY WITH PROPOFOL ;  Surgeon: Suzette Espy, MD;  Location: [facility];  Service: Endoscopy;  Laterality: N/A;  930am, asa 2   ENDOSCOPIC MUCOSAL RESECTION  05/27/2023   Procedure: ENDOSCOPIC MUCOSAL RESECTION;  Surgeon: Suzette Espy, MD;  Location: [facility];  Service: Endoscopy;;   POLYPECTOMY  05/27/2023   Procedure: POLYPECTOMY;  Surgeon: Suzette Espy, MD;  Location[facility];  Service: Endoscopy;;   SCROTAL EXPLORATION N/A 04/13/2021   Procedure: EXCISION OF SCROTAL LESION;  Surgeon: Marco Severs, MD;  Location: [facility];  Service: Urology;  Laterality: N/A;   SUBMUCOSAL LIFTING INJECTION  05/27/2023   Procedure: SUBMUCOSAL LIFTING INJECTION;  Surgeon: Suzette Espy, MD;  Location: [facility];  Service:  Endoscopy;;    Home Medications:  Allergies as of 03/17/2024       Reactions   Lisinopril  Swelling        Medication List        Accurate as of March 16, 2024  7:41 PM. If you have any questions, ask your nurse or doctor.          ampicillin 500 MG capsule Commonly known as: PRINCIPEN Take 500 mg by mouth 4 (four) times daily.   aspirin  EC 81 MG tablet Take 81 mg by mouth daily. Swallow whole.   clotrimazole -betamethasone  cream Commonly known as: LOTRISONE  Apply 1 Application topically daily.   diltiazem  120 MG 24 hr capsule Commonly known as: CARDIZEM  CD Take 120 mg by mouth daily.   Farxiga 10 MG Tabs tablet Generic drug: dapagliflozin propanediol Take 10 mg by mouth daily.   glipiZIDE  10 MG 24 hr tablet Commonly known as: Glucotrol  XL Take 1 tablet (10 mg total) by mouth daily with breakfast.   hydrochlorothiazide  25 MG tablet Commonly known as: HYDRODIURIL  Take 25 mg by mouth daily.   metFORMIN  1000 MG tablet Commonly known as: GLUCOPHAGE  Take 1,000 mg by mouth 2 (two) times daily with a meal.   metoprolol  succinate 25 MG 24 hr tablet Commonly known as: TOPROL -XL Take 1 tablet (25 mg total) by mouth daily.   OneTouch Verio High Liqd   rosuvastatin 20 MG tablet Commonly known as: CRESTOR Take 20 mg by mouth at bedtime.   Semaglutide 7  MG Tabs Take 7 mg by mouth every morning.   sildenafil  100 MG tablet Commonly known as: VIAGRA  1/2 to 1 tablet p.o. as needed   Simple Diagnostics Lancing Dev Misc Apply topically.        Allergies:  Allergies  Allergen Reactions   Lisinopril  Swelling    Family History: Family History  Problem Relation Age of Onset   Hypertension Mother    Diabetes Mother    Arthritis Mother    Heart disease Mother    Miscarriages / [place] Mother    Stroke Mother    Depression Father    Mental illness Father    Lupus Sister    Diabetes Daughter    Hypertension Daughter    Colon cancer Neg Hx     Colon polyps Neg Hx    Cancer - Colon Neg Hx     Social History:  reports that he has never smoked. He has never used smokeless tobacco. He reports that he does not drink alcohol and does not use drugs.  ROS: All other review of systems were reviewed and are negative except what is noted above in HPI  Physical Exam: There were no vitals taken for this visit.  Constitutional:  Alert and oriented, No acute distress. HEENT: [name], moist mucus membranes.  Trachea midline, no masses. Cardiovascular: No clubbing, cyanosis, or edema. Respiratory: Normal respiratory effort, no increased work of breathing. GI: No inguinal hernias GU: Normal cervical sized phallus. No masses/lesions on penis, testis, scrotum. Prostate 50 g smooth no nodules no induration.  Lymph: No cervical or inguinal lymphadenopathy. Skin: No rashes, bruises or suspicious lesions. Neurologic: Grossly intact, no focal deficits, moving all 4 extremities. Psychiatric: Normal mood and affect.  Laboratory Data: Lab Results  Component Value Date   WBC 9.1 04/11/2021   HGB 14.6 04/11/2021   HCT 44.3 04/11/2021   MCV 89.0 04/11/2021   PLT 174 04/11/2021    Lab Results  Component Value Date   CREATININE 0.91 04/11/2021    No results found for: "PSA"  No results found for: "TESTOSTERONE"  Lab Results  Component Value Date   HGBA1C 11.1 (H) 04/11/2021    Urinalysis    Component Value Date/Time   COLORURINE YELLOW 04/01/2020 1317   APPEARANCEUR Cloudy (A) 06/27/2021 1002   LABSPEC 1.016 04/01/2020 1317   PHURINE 6.0 04/01/2020 1317   GLUCOSEU 1+ (A) 06/27/2021 1002   HGBUR SMALL (A) 04/01/2020 1317   BILIRUBINUR Negative 06/27/2021 1002   KETONESUR NEGATIVE 04/01/2020 1317   PROTEINUR 2+ (A) 06/27/2021 1002   PROTEINUR 30 (A) 04/01/2020 1317   NITRITE Positive (A) 06/27/2021 1002   NITRITE NEGATIVE 04/01/2020 1317   LEUKOCYTESUR 1+ (A) 06/27/2021 1002   LEUKOCYTESUR LARGE (A) 04/01/2020 1317    Lab  Results  Component Value Date   LABMICR See below: 06/27/2021   WBCUA >30 (A) 06/27/2021   LABEPIT 0-10 06/27/2021   MUCUS Present 06/27/2021   BACTERIA Many (A) 06/27/2021    Pertinent Imaging: . 23 ppgs of PCP notes reviewed   Assessment: - Recurrent pyuria, symptomatic  -Normal PSA/DRE  -History of scrotal condyloma, phimosis, status post excision with circumcision, doing well  Plan:  - I sent in a urine for culture.  No antibiotics administered yet  -Depending on bacterial species, will treat for extended period of time  -I will have him come back in 3 months There are no diagnoses linked to this encounter.  No follow-ups on file.  Malcolm Scrivener Alfred Harrel,  MD  [facility] [place]

## 2024-03-17 ENCOUNTER — Ambulatory Visit: Admitting: Urology

## 2024-03-17 VITALS — BP 143/80 | HR 74

## 2024-03-17 DIAGNOSIS — R8281 Pyuria: Secondary | ICD-10-CM

## 2024-03-17 DIAGNOSIS — R972 Elevated prostate specific antigen [PSA]: Secondary | ICD-10-CM | POA: Diagnosis not present

## 2024-03-17 DIAGNOSIS — N5201 Erectile dysfunction due to arterial insufficiency: Secondary | ICD-10-CM

## 2024-03-17 LAB — URINALYSIS, ROUTINE W REFLEX MICROSCOPIC
Bilirubin, UA: NEGATIVE
Ketones, UA: NEGATIVE
Nitrite, UA: POSITIVE — AB
Specific Gravity, UA: 1.025 (ref 1.005–1.030)
Urobilinogen, Ur: 1 mg/dL (ref 0.2–1.0)
pH, UA: 6.5 (ref 5.0–7.5)

## 2024-03-17 LAB — MICROSCOPIC EXAMINATION: WBC, UA: >30 /HPF — AB (ref 0–5)

## 2024-03-20 LAB — URINE CULTURE

## 2024-03-24 ENCOUNTER — Telehealth: Payer: Self-pay

## 2024-03-24 ENCOUNTER — Other Ambulatory Visit: Payer: Self-pay | Admitting: Urology

## 2024-03-24 DIAGNOSIS — R8281 Pyuria: Secondary | ICD-10-CM

## 2024-03-24 MED ORDER — AMOXICILLIN 500 MG PO CAPS: 500.0000 mg | ORAL_CAPSULE | Freq: Two times a day (BID) | ORAL | 0 refills | Status: AC

## 2024-03-24 NOTE — Telephone Encounter (Signed)
 Called and left detailed message regarding MD response to urine culture see previous encounter

## 2024-03-24 NOTE — Telephone Encounter (Signed)
-----   Message from Malcolm Scrivener Dahlstedt sent at 03/24/2024 10:25 AM EDT ----- PLease call pt--urine culture +--I sent in abx for 3 weeks ----- Message ----- From: Interface, [facility] Lab Results In Sent: 03/17/2024   3:36 PM EDT To: Trent Frizzle, MD

## 2024-03-26 ENCOUNTER — Other Ambulatory Visit (HOSPITAL_COMMUNITY)
Admission: RE | Admit: 2024-03-26 | Discharge: 2024-03-26 | Disposition: A | Discharge status: Home / Self Care | Source: Ambulatory Visit | Attending: Internal Medicine | Admitting: Internal Medicine

## 2024-03-26 DIAGNOSIS — Z860100 Personal history of colon polyps, unspecified: Secondary | ICD-10-CM | POA: Insufficient documentation

## 2024-03-26 DIAGNOSIS — Z09 Encounter for follow-up examination after completed treatment for conditions other than malignant neoplasm: Secondary | ICD-10-CM | POA: Diagnosis not present

## 2024-03-26 LAB — BASIC METABOLIC PANEL WITH GFR
Anion gap: 7 (ref 5–15)
BUN: 14 mg/dL (ref 8–23)
CO2: 24 mmol/L (ref 22–32)
Calcium: 9 mg/dL (ref 8.9–10.3)
Chloride: 103 mmol/L (ref 98–111)
Creatinine, Ser: 0.88 mg/dL (ref 0.61–1.24)
GFR, Estimated: >60 mL/min (ref >60)
Glucose, Bld: 119 mg/dL — ABNORMAL HIGH (ref 70–99)
Potassium: 3.7 mmol/L (ref 3.5–5.1)
Sodium: 134 mmol/L — ABNORMAL LOW (ref 135–145)

## 2024-03-30 ENCOUNTER — Other Ambulatory Visit: Payer: Self-pay

## 2024-03-30 ENCOUNTER — Encounter (HOSPITAL_COMMUNITY): Payer: Self-pay | Admitting: Internal Medicine

## 2024-03-30 ENCOUNTER — Ambulatory Visit (HOSPITAL_COMMUNITY): Admitting: Anesthesiology

## 2024-03-30 ENCOUNTER — Ambulatory Visit (HOSPITAL_BASED_OUTPATIENT_CLINIC_OR_DEPARTMENT_OTHER): Admitting: Anesthesiology

## 2024-03-30 ENCOUNTER — Ambulatory Visit (HOSPITAL_COMMUNITY): Admission: RE | Admit: 2024-03-30 | Source: Home / Self Care | Admitting: Internal Medicine

## 2024-03-30 ENCOUNTER — Ambulatory Visit (HOSPITAL_COMMUNITY)
Admission: RE | Admit: 2024-03-30 | Discharge: 2024-03-30 | Disposition: A | Discharge status: Home / Self Care | Source: Ambulatory Visit | Attending: Internal Medicine | Admitting: Internal Medicine

## 2024-03-30 ENCOUNTER — Encounter (HOSPITAL_COMMUNITY): Admission: RE | Payer: Self-pay | Source: Home / Self Care

## 2024-03-30 ENCOUNTER — Encounter (HOSPITAL_COMMUNITY): Payer: Self-pay | Admitting: Anesthesiology

## 2024-03-30 ENCOUNTER — Encounter (HOSPITAL_COMMUNITY)
Admission: RE | Disposition: A | Discharge status: Home / Self Care | Payer: Self-pay | Source: Ambulatory Visit | Attending: Internal Medicine

## 2024-03-30 DIAGNOSIS — Z1211 Encounter for screening for malignant neoplasm of colon: Secondary | ICD-10-CM | POA: Insufficient documentation

## 2024-03-30 DIAGNOSIS — Z860100 Personal history of colon polyps, unspecified: Secondary | ICD-10-CM | POA: Diagnosis not present

## 2024-03-30 DIAGNOSIS — Z7984 Long term (current) use of oral hypoglycemic drugs: Secondary | ICD-10-CM | POA: Insufficient documentation

## 2024-03-30 DIAGNOSIS — Z8249 Family history of ischemic heart disease and other diseases of the circulatory system: Secondary | ICD-10-CM | POA: Diagnosis not present

## 2024-03-30 DIAGNOSIS — E119 Type 2 diabetes mellitus without complications: Secondary | ICD-10-CM | POA: Diagnosis not present

## 2024-03-30 DIAGNOSIS — Z833 Family history of diabetes mellitus: Secondary | ICD-10-CM | POA: Diagnosis not present

## 2024-03-30 DIAGNOSIS — Z860101 Personal history of adenomatous and serrated colon polyps: Secondary | ICD-10-CM | POA: Diagnosis not present

## 2024-03-30 DIAGNOSIS — I10 Essential (primary) hypertension: Secondary | ICD-10-CM | POA: Insufficient documentation

## 2024-03-30 HISTORY — PX: COLONOSCOPY: SHX5424

## 2024-03-30 LAB — GLUCOSE, CAPILLARY: Glucose-Capillary: 113 mg/dL — ABNORMAL HIGH (ref 70–99)

## 2024-03-30 SURGERY — COLONOSCOPY
Anesthesia: General

## 2024-03-30 SURGERY — COLONOSCOPY
Anesthesia: Choice

## 2024-03-30 MED ORDER — LIDOCAINE 2% (20 MG/ML) 5 ML SYRINGE
INTRAMUSCULAR | Status: DC | PRN
Start: 2024-03-30 — End: 2024-03-30
  Administered 2024-03-30: 100 mg via INTRAVENOUS

## 2024-03-30 MED ORDER — LACTATED RINGERS IV SOLN: INTRAVENOUS | Status: DC | PRN

## 2024-03-30 MED ORDER — PROPOFOL 10 MG/ML IV BOLUS
INTRAVENOUS | Status: DC | PRN
  Administered 2024-03-30: 100 mg via INTRAVENOUS

## 2024-03-30 MED ORDER — PROPOFOL 500 MG/50ML IV EMUL
INTRAVENOUS | Status: DC | PRN
Start: 2024-03-30 — End: 2024-03-30
  Administered 2024-03-30: 200 ug/kg/min via INTRAVENOUS

## 2024-03-30 NOTE — Discharge Instructions (Addendum)
  Colonoscopy Discharge Instructions  Read the instructions outlined below and refer to this sheet in the next few weeks. These discharge instructions provide you with general information on caring for yourself after you leave the hospital. Your doctor may also give you specific instructions. While your treatment has been planned according to the most current medical practices available, unavoidable complications occasionally occur. If you have any problems or questions after discharge, call Dr. Riley Cheadle at [phone]. ACTIVITY You may resume your regular activity, but move at a slower pace for the next 24 hours.  Take frequent rest periods for the next 24 hours.  Walking will help get rid of the air and reduce the bloated feeling in your belly (abdomen).  No driving for 24 hours (because of the medicine (anesthesia) used during the test).   Do not sign any important legal documents or operate any machinery for 24 hours (because of the anesthesia used during the test).  NUTRITION Drink plenty of fluids.  You may resume your normal diet as instructed by your doctor.  Begin with a light meal and progress to your normal diet. Heavy or fried foods are harder to digest and may make you feel sick to your stomach (nauseated).  Avoid alcoholic beverages for 24 hours or as instructed.  MEDICATIONS You may resume your normal medications unless your doctor tells you otherwise.  WHAT YOU CAN EXPECT TODAY Some feelings of bloating in the abdomen.  Passage of more gas than usual.  Spotting of blood in your stool or on the toilet paper.  IF YOU HAD POLYPS REMOVED DURING THE COLONOSCOPY: No aspirin  products for 7 days or as instructed.  No alcohol for 7 days or as instructed.  Eat a soft diet for the next 24 hours.  FINDING OUT THE RESULTS OF YOUR TEST Not all test results are available during your visit. If your test results are not back during the visit, make an appointment with your caregiver to find out the  results. Do not assume everything is normal if you have not heard from your caregiver or the medical facility. It is important for you to follow up on all of your test results.  SEEK IMMEDIATE MEDICAL ATTENTION IF: You have more than a spotting of blood in your stool.  Your belly is swollen (abdominal distention).  You are nauseated or vomiting.  You have a temperature over 101.  You have abdominal pain or discomfort that is severe or gets worse throughout the day.      Large polyp removed last year was completely gone no other polyps found  It is recommended you return for repeat colonoscopy in 3 years

## 2024-03-30 NOTE — Anesthesia Procedure Notes (Signed)
 Date/Time: 03/30/2024 9:51 AM  Performed by: Sherwin Donate, CRNAPre-anesthesia Checklist: Patient identified, Emergency Drugs available, Suction available and Patient being monitored Patient Re-evaluated:Patient Re-evaluated prior to induction Oxygen Delivery Method: Nasal cannula Induction Type: IV induction Placement Confirmation: positive ETCO2

## 2024-03-30 NOTE — Anesthesia Preprocedure Evaluation (Signed)
 Anesthesia Evaluation  Patient identified by MRN, date of birth, ID band Patient awake    Reviewed: Allergy & Precautions, H&P , NPO status , Patient's Chart, lab work & pertinent test results, reviewed documented beta blocker date and time   Airway Mallampati: II  TM Distance: >3 FB Neck ROM: full    Dental no notable dental hx.    Pulmonary neg pulmonary ROS   Pulmonary exam normal breath sounds clear to auscultation       Cardiovascular Exercise Tolerance: Good hypertension, negative cardio ROS  Rhythm:regular Rate:Normal     Neuro/Psych negative neurological ROS  negative psych ROS   GI/Hepatic negative GI ROS, Neg liver ROS,,,  Endo/Other  negative endocrine ROSdiabetes    Renal/GU negative Renal ROS  negative genitourinary   Musculoskeletal   Abdominal   Peds  Hematology negative hematology ROS (+)   Anesthesia Other Findings   Reproductive/Obstetrics negative OB ROS                             Anesthesia Physical Anesthesia Plan  ASA: 2  Anesthesia Plan: General   Post-op Pain Management:    Induction:   PONV Risk Score and Plan: Propofol infusion  Airway Management Planned:   Additional Equipment:   Intra-op Plan:   Post-operative Plan:   Informed Consent: I have reviewed the patients History and Physical, chart, labs and discussed the procedure including the risks, benefits and alternatives for the proposed anesthesia with the patient or authorized representative who has indicated his/her understanding and acceptance.     Dental Advisory Given  Plan Discussed with: CRNA  Anesthesia Plan Comments:        Anesthesia Quick Evaluation

## 2024-03-30 NOTE — Transfer of Care (Signed)
 Immediate Anesthesia Transfer of Care Note  Patient: Christopher Sullivan  Procedure(s) Performed: COLONOSCOPY  Patient Location: Endoscopy Unit  Anesthesia Type:General  Level of Consciousness: drowsy  Airway & Oxygen Therapy: Patient Spontanous Breathing  Post-op Assessment: Report given to RN and Post -op Vital signs reviewed and stable  Post vital signs: Reviewed and stable  Last Vitals:  Vitals Value Taken Time  BP    Temp    Pulse    Resp    SpO2      Last Pain:  Vitals:   03/30/24 0952  TempSrc:   PainSc: 0-No pain      Patients Stated Pain Goal: 8 (03/30/24 0811)  Complications: No notable events documented.

## 2024-03-30 NOTE — H&P (Signed)
 @LOGO @   Primary Care Physician:  Dorena Gander, MD Primary Gastroenterologist:  Dr. Riley Cheadle  Pre-Procedure History & Physical: HPI:  Christopher Sullivan is a 68 y.o. male here for for surveillance colonoscopy.  History of a large tubular adenoma removed from the distal side of the ileocecal valve last year.  Here for early interval surveillance.  Past Medical History:  Diagnosis Date   Bilateral primary osteoarthritis of knee    Diabetes mellitus without complication (HCC)    GERD (gastroesophageal reflux disease)    Hypercholesteremia    Hyperlipidemia    Hypertension     Past Surgical History:  Procedure Laterality Date   CATARACT EXTRACTION     CIRCUMCISION N/A 04/13/2021   Procedure: CIRCUMCISION;  Surgeon: Marco Severs, MD;  Location: [facility];  Service: Urology;  Laterality: N/A;   COLONOSCOPY WITH PROPOFOL  N/A 05/27/2023   Procedure: COLONOSCOPY WITH PROPOFOL ;  Surgeon: Suzette Espy, MD;  [facility];  Service: Endoscopy;  Laterality: N/A;  930am, asa 2   ENDOSCOPIC MUCOSAL RESECTION  05/27/2023   Procedure: ENDOSCOPIC MUCOSAL RESECTION;  Surgeon: Suzette Espy, MD;  [facility]: [facility];  Service: Endoscopy;;   POLYPECTOMY  05/27/2023   Procedure: POLYPECTOMY;  Surgeon: Suzette Espy, MD;  [facility];  Service: Endoscopy;;   SCROTAL EXPLORATION N/A 04/13/2021   Procedure: EXCISION OF SCROTAL LESION;  Surgeon: Marco Severs, MD;  Location: [facility];  Service: Urology;  Laterality: N/A;   SUBMUCOSAL LIFTING INJECTION  05/27/2023   Procedure: SUBMUCOSAL LIFTING INJECTION;  Surgeon: Suzette Espy, MD;  [facility];  Service: Endoscopy;;    Prior to Admission medications   Medication Sig Start Date End Date Taking? Authorizing Provider  clotrimazole -betamethasone  (LOTRISONE ) cream Apply 1 Application topically daily. 10/04/23  Yes Velma Ghazi, DPM  amoxicillin  (AMOXIL ) 500 MG capsule Take 1 capsule (500 mg total) by mouth 2 (two)  times daily. 03/24/24   Trent Frizzle, MD  ampicillin (PRINCIPEN) 500 MG capsule Take 500 mg by mouth 4 (four) times daily. 05/01/23   [provider]  aspirin  EC 81 MG tablet Take 81 mg by mouth daily. Swallow whole.    [provider]  Blood Glucose Calibration (ONETOUCH VERIO) High LIQD  02/18/23   [provider]  dapagliflozin propanediol (FARXIGA) 10 MG TABS tablet Take 10 mg by mouth daily.    [provider]  diltiazem  (CARDIZEM  CD) 120 MG 24 hr capsule Take 120 mg by mouth daily.    [provider]  glipiZIDE  (GLUCOTROL  XL) 10 MG 24 hr tablet Take 1 tablet (10 mg total) by mouth daily with breakfast. 08/18/18   Dorena Gander, MD  hydrochlorothiazide  (HYDRODIURIL ) 25 MG tablet Take 25 mg by mouth daily.    [provider]  Lancet Devices (SIMPLE DIAGNOSTICS LANCING DEV) MISC Apply topically. 02/18/23   [provider]  metFORMIN  (GLUCOPHAGE ) 1000 MG tablet Take 1,000 mg by mouth 2 (two) times daily with a meal.     [provider]  metoprolol  succinate (TOPROL -XL) 25 MG 24 hr tablet Take 1 tablet (25 mg total) by mouth daily. 08/18/18   Dorena Gander, MD  rosuvastatin (CRESTOR) 20 MG tablet Take 20 mg by mouth at bedtime. 05/14/23   [provider]  Semaglutide 7 MG TABS Take 7 mg by mouth every morning.    [provider]  sildenafil  (VIAGRA ) 100 MG tablet 1/2 to 1 tablet p.o. as needed 06/27/21   Dahlstedt,  Mara Seminole, MD    Allergies as of 03/30/2024 - Review Complete 03/30/2024  Allergen Reaction Noted   Lisinopril  Swelling 11/25/2019    Family History  Problem Relation Age of Onset   Hypertension Mother    Diabetes Mother    Arthritis Mother    Heart disease Mother    Miscarriages / [place] Mother    Stroke Mother    Depression Father    Mental illness Father    Lupus Sister    Diabetes Daughter    Hypertension Daughter    Colon cancer Neg Hx    Colon polyps Neg Hx    Cancer -  Colon Neg Hx     Social History   Socioeconomic History   Marital status: Married    Spouse name: Not on file   Number of children: 1   Years of education: Not on file   Highest education level: Not on file  Occupational History   Occupation: retired  Tobacco Use   Smoking status: Never   Smokeless tobacco: Never  Vaping Use   Vaping status: Never Used  Substance and Sexual Activity   Alcohol use: No   Drug use: No   Sexual activity: Not on file  Other Topics Concern   Not on file  Social History Narrative   Married. Works at [employer]. Third shift.    Two children/stepson and daughter.    Social Drivers of Corporate investment banker Strain: Not on file  Food Insecurity: Not on file  Transportation Needs: Not on file  Physical Activity: Not on file  Stress: Not on file  Social Connections: Not on file  Intimate Partner Violence: Not on file    Review of Systems: See HPI, otherwise negative ROS  Physical Exam: BP (!) 165/79   Pulse 82   Temp 98.2 F (36.8 C) (Oral)   Resp 14   Ht 5\' 8"  (1.727 m)   Wt 106.1 kg   SpO2 98%   BMI 35.58 kg/m  General:   Alert,  Well-developed, well-nourished, pleasant and cooperative in NAD cal adenopathy. Lungs:  Clear throughout to auscultation.   No wheezes, crackles, or rhonchi. No acute distress. Heart:  Regular rate and rhythm; no murmurs, clicks, rubs,  or gallops. Abdomen: Non-distended, normal bowel sounds.  Soft and nontender without appreciable mass or hepatosplenomegaly.    Impression/Plan: 68 year old gentleman with a large adenoma removed from the ileocecal valve last year here for early interval surveillance colonoscopy.  The risks, benefits, limitations, alternatives and imponderables have been reviewed with the patient. Questions have been answered. All parties are agreeable.       Notice: This dictation was prepared with Dragon dictation along with smaller phrase technology. Any transcriptional  errors that result from this process are unintentional and may not be corrected upon review.

## 2024-03-30 NOTE — Op Note (Signed)
 [name] Patient Name: Christopher Sullivan Procedure Date: 03/30/2024 9:25 AM MRN: [id number] Date of Birth: Jun 18, 1956 Attending MD: Gemma Kelp , MD, [id number] CSN: [id number] Age: 68 Admit Type: Outpatient Procedure:                Colonoscopy Indications:              High risk colon cancer surveillance: Personal                            history of colonic polyps Providers:                Gemma Kelp, MD, Crystal Page, [place]                            Wilson, Technician, Theola Fitch Referring MD:              Medicines:                Propofol  per Anesthesia Complications:            No immediate complications. Estimated Blood Loss:     Estimated blood loss: none. Procedure:                Pre-Anesthesia Assessment:                           - Prior to the procedure, a History and Physical                            was performed, and patient medications and                            allergies were reviewed. The patient's tolerance of                            previous anesthesia was also reviewed. The risks                            and benefits of the procedure and the sedation                            options and risks were discussed with the patient.                            All questions were answered, and informed consent                            was obtained. Prior Anticoagulants: The patient has                            taken no anticoagulant or antiplatelet agents. ASA                            Grade Assessment: II - A patient with mild systemic  disease. After reviewing the risks and benefits,                            the patient was deemed in satisfactory condition to                            undergo the procedure.                           After obtaining informed consent, the colonoscope                            was passed under direct vision. Throughout the                            procedure, the  patient's blood pressure, pulse, and                            oxygen saturations were monitored continuously. The                            [id number]) scope was introduced through                            the anus and advanced to the the cecum, identified                            by appendiceal orifice and ileocecal valve. The                            colonoscopy was performed without difficulty. The                            patient tolerated the procedure well. The quality                            of the bowel preparation was adequate. The                            ileocecal valve, appendiceal orifice, and rectum                            were photographed. Scope In: 9:59:25 AM Scope Out: 10:10:01 AM Scope Withdrawal Time: 0 hours 4 minutes 47 seconds  Total Procedure Duration: 0 hours 10 minutes 36 seconds  Findings:      The perianal and digital rectal examinations were normal.      The colon (entire examined portion) appeared normal. Scar on the distal       side of ileocecal valve identified. No evidence of persisting polyp       tissue.      The retroflexed view of the distal rectum and anal verge was normal and       showed no anal or rectal abnormalities. Impression:               - The entire  examined colon is normal.                           - The distal rectum and anal verge are normal on                            retroflexion view.                           - No specimens collected. Moderate Sedation:      Moderate (conscious) sedation was personally administered by an       anesthesia professional. The following parameters were monitored: oxygen       saturation, heart rate, blood pressure, respiratory rate, EKG, adequacy       of pulmonary ventilation, and response to care. Recommendation:           - Patient has a contact number available for                            emergencies. The signs and symptoms of potential                             delayed complications were discussed with the                            patient. Return to normal activities tomorrow.                            Written discharge instructions were provided to the                            patient.                           - Advance diet as tolerated.                           - Continue present medications.                           - Repeat colonoscopy in 3 years for surveillance.                           - Return to GI office (date not yet determined). Procedure Code(s):        --- Professional ---                           [phone], Colonoscopy, flexible; diagnostic, including                            collection of specimen(s) by brushing or washing,                            when performed (separate procedure) Diagnosis Code(s):        --- Professional ---  Z86.010, Personal history of colonic polyps CPT copyright 2022 American Medical Association. All rights reserved. The codes documented in this report are preliminary and upon coder review may  be revised to meet current compliance requirements. Windsor Hatcher. Peretz Thieme, MD Gemma Kelp, MD 03/30/2024 10:20:38 AM This report has been signed electronically. Number of Addenda: 0

## 2024-03-31 ENCOUNTER — Encounter (HOSPITAL_COMMUNITY): Payer: Self-pay | Admitting: Internal Medicine

## 2024-03-31 NOTE — Anesthesia Postprocedure Evaluation (Signed)
 Anesthesia Post Note  Patient: Christopher Sullivan  Procedure(s) Performed: COLONOSCOPY  Patient location during evaluation: Phase II Anesthesia Type: General Level of consciousness: awake Pain management: pain level controlled Vital Signs Assessment: post-procedure vital signs reviewed and stable Respiratory status: spontaneous breathing and respiratory function stable Cardiovascular status: blood pressure returned to baseline and stable Postop Assessment: no headache and no apparent nausea or vomiting Anesthetic complications: no Comments: Late entry   No notable events documented.   Last Vitals:  Vitals:   03/30/24 0811 03/30/24 1014  BP: (!) 165/79 (!) 115/53  Pulse: 82 72  Resp: 14 18  Temp: 36.8 C 36.8 C  SpO2: 98% 98%    Last Pain:  Vitals:   03/30/24 1014  TempSrc: Oral  PainSc: 0-No pain                 Coretha Dew

## 2024-04-13 DIAGNOSIS — E538 Deficiency of other specified B group vitamins: Secondary | ICD-10-CM | POA: Diagnosis not present

## 2024-04-13 DIAGNOSIS — E1169 Type 2 diabetes mellitus with other specified complication: Secondary | ICD-10-CM | POA: Diagnosis not present

## 2024-04-13 DIAGNOSIS — E782 Mixed hyperlipidemia: Secondary | ICD-10-CM | POA: Diagnosis not present

## 2024-04-13 DIAGNOSIS — I10 Essential (primary) hypertension: Secondary | ICD-10-CM | POA: Diagnosis not present

## 2024-04-13 DIAGNOSIS — Z0000 Encounter for general adult medical examination without abnormal findings: Secondary | ICD-10-CM | POA: Diagnosis not present

## 2024-04-29 DIAGNOSIS — E538 Deficiency of other specified B group vitamins: Secondary | ICD-10-CM | POA: Diagnosis not present

## 2024-05-07 ENCOUNTER — Ambulatory Visit: Admitting: Podiatry

## 2024-05-08 ENCOUNTER — Ambulatory Visit: Admitting: Podiatry

## 2024-05-08 DIAGNOSIS — M79675 Pain in left toe(s): Secondary | ICD-10-CM | POA: Diagnosis not present

## 2024-05-08 DIAGNOSIS — L84 Corns and callosities: Secondary | ICD-10-CM | POA: Diagnosis not present

## 2024-05-08 DIAGNOSIS — M79674 Pain in right toe(s): Secondary | ICD-10-CM | POA: Diagnosis not present

## 2024-05-08 DIAGNOSIS — E1142 Type 2 diabetes mellitus with diabetic polyneuropathy: Secondary | ICD-10-CM | POA: Diagnosis not present

## 2024-05-08 DIAGNOSIS — B351 Tinea unguium: Secondary | ICD-10-CM

## 2024-05-14 ENCOUNTER — Encounter: Payer: Self-pay | Admitting: Podiatry

## 2024-05-14 NOTE — Progress Notes (Signed)
  Subjective:  Patient ID: Christopher Sullivan, male    DOB: 1955-12-04,  MRN: [id number]  68 y.o. male presents with at risk foot care with history of diabetic neuropathy and painful thick toenails that are difficult to trim. Pain interferes with ambulation. Aggravating factors include wearing enclosed shoe gear. Pain is relieved with periodic professional debridement. No chief complaint on file.    PCP: Dorena Gander, MD. [name] 04/13/2024.  New problem(s): None.   Review of Systems: Negative except as noted in the HPI.   Allergies  Allergen Reactions   Lisinopril  Swelling    Objective:  There were no vitals filed for this visit. Constitutional Patient is a pleasant 68 y.o. male in NAD. AAO x 3.  Vascular Capillary fill time to digits <3 seconds.  DP/PT pulse(s) are faintly palpable b/l lower extremities. Pedal hair absent b/l. Lower extremity skin temperature gradient warm to cool b/l. No pain with calf compression b/l. No cyanosis or clubbing noted. No ischemia nor gangrene noted b/l. No edema noted b/l LE.  Neurologic Protective sensation decreased with 10 gram monofilament b/l.  Dermatologic Pedal skin is thin, shiny and atrophic b/l.  No open wounds b/l lower extremities. No interdigital macerations b/l lower extremities. Toenails 1-5 b/l elongated, discolored, dystrophic, thickened, crumbly with subungual debris and tenderness to dorsal palpation. Hyperkeratotic lesion(s) submet head 1 left foot, submet head 5 left foot, and sub 5th met base left foot.  No erythema, no edema, no drainage, no fluctuance.  Orthopedic: Muscle strength 5/5 to all lower extremity muscle groups bilaterally. Hammertoe(s) 2-5 b/l. Pes planus deformity noted bilateral LE.    Assessment:   1. Pain due to onychomycosis of toenails of both feet   2. Callus   3. Type 2 diabetes mellitus with diabetic polyneuropathy, without long-term current use of insulin  (HCC)    Plan:  Consent given for treatment. Patient  examined.All patient's and/or POA's questions/concerns addressed on today's visit. Toenails 1-5 debrided in length and girth without incident. Callus(es) submet head 1 left foot, submet head 5 left foot, and sub 5th met base left foot pared with sharp debridement without incident. Continue foot and shoe inspections daily. Monitor blood glucose per PCP/Endocrinologist's recommendations.Continue soft, supportive shoe gear daily. Report any pedal injuries to medical professional. Call office if there are any questions/concerns. -Patient/POA to call should there be question/concern in the interim.  Return in about 3 months (around 08/08/2024).  [name], DPM       LOCATION: [address] [place] [address]                   Office [phone]   [facility] LOCATION: [address] [place], [place] [address] Office [phone]

## 2024-06-16 ENCOUNTER — Ambulatory Visit: Admitting: Urology

## 2024-07-28 ENCOUNTER — Ambulatory Visit: Admitting: Urology

## 2024-07-28 NOTE — Progress Notes (Deleted)
 07/28/2024 8:37 AM   Christopher Sullivan 04-04-1956 [id number]  Referring provider: [name], MD [id number] Christopher [place] [address] Christopher [place],  [place] [address]  No chief complaint on file.   HPI: 68 yo male sent by Dr Christopher for E/M of elevated PSA. His last PSA was on 10.18.2024 and was 3.16 (no other PSA data available).  He has been seen here before for circumcision and excision of a large scrotal condylomata by Dr. Sherrilee.  He is having frequency urgency, some leakage as well as foul-smelling urine.  He has been on 2 courses of an antibiotic over the past several months.  No gross hematuria, no pneumaturia.    PMH: Past Medical History:  Diagnosis Date   Bilateral primary osteoarthritis of knee    Diabetes mellitus without complication (HCC)    GERD (gastroesophageal reflux disease)    Hypercholesteremia    Hyperlipidemia    Hypertension     Surgical History: Past Surgical History:  Procedure Laterality Date   CATARACT EXTRACTION     CIRCUMCISION N/A 04/13/2021   Procedure: CIRCUMCISION;  Surgeon: [name], MD;  Location: [facility];  Service: Urology;  Laterality: N/A;   COLONOSCOPY N/A 03/30/2024   Procedure: COLONOSCOPY;  Surgeon: [name], MD;  Location: [facility];  Service: Endoscopy;  Laterality: N/A;   COLONOSCOPY WITH PROPOFOL  N/A 05/27/2023   Procedure: COLONOSCOPY WITH PROPOFOL ;  Surgeon: [name], MD;  Location: [facility];  Service: Endoscopy;  Laterality: N/A;  930am, asa 2   ENDOSCOPIC MUCOSAL RESECTION  05/27/2023   Procedure: ENDOSCOPIC MUCOSAL RESECTION;  Surgeon: [name], MD;  Location: [facility];  Service: Endoscopy;;   POLYPECTOMY  05/27/2023   Procedure: POLYPECTOMY;  Surgeon: [name], MD;  Location: [facility];  Service: Endoscopy;;   SCROTAL EXPLORATION N/A 04/13/2021   Procedure: EXCISION OF SCROTAL LESION;  Surgeon: [name], MD;  Location: [facility];  Service: Urology;  Laterality: N/A;    SUBMUCOSAL LIFTING INJECTION  05/27/2023   Procedure: SUBMUCOSAL LIFTING INJECTION;  Surgeon: [name], MD;  Location: [facility];  Service: Endoscopy;;    Home Medications:  Allergies as of 07/28/2024       Reactions   Lisinopril  Swelling        Medication List        Accurate as of July 28, 2024  8:37 AM. If you have any questions, ask your nurse or doctor.          amoxicillin  500 MG capsule Commonly known as: AMOXIL  Take 1 capsule (500 mg total) by mouth 2 (two) times daily.   ampicillin 500 MG capsule Commonly known as: PRINCIPEN Take 500 mg by mouth 4 (four) times daily.   aspirin  EC 81 MG tablet Take 81 mg by mouth daily. Swallow whole.   clotrimazole -betamethasone  cream Commonly known as: LOTRISONE  Apply 1 Application topically daily.   diltiazem  120 MG 24 hr capsule Commonly known as: CARDIZEM  CD Take 120 mg by mouth daily.   Farxiga 10 MG Tabs tablet Generic drug: dapagliflozin propanediol Take 10 mg by mouth daily.   glipiZIDE  10 MG 24 hr tablet Commonly known as: Glucotrol  XL Take 1 tablet (10 mg total) by mouth daily with breakfast.   hydrochlorothiazide  25 MG tablet Commonly known as: HYDRODIURIL  Take 25 mg by mouth daily.   metFORMIN  1000 MG tablet Commonly known as: GLUCOPHAGE  Take 1,000 mg by mouth 2 (two) times daily with a meal.  metoprolol  succinate 25 MG 24 hr tablet Commonly known as: TOPROL -XL Take 1 tablet (25 mg total) by mouth daily.   OneTouch Verio High Liqd   rosuvastatin 20 MG tablet Commonly known as: CRESTOR Take 20 mg by mouth at bedtime.   Semaglutide 7 MG Tabs Take 7 mg by mouth every morning.   sildenafil  100 MG tablet Commonly known as: VIAGRA  1/2 to 1 tablet p.o. as needed   Simple Diagnostics Lancing Dev Misc Apply topically.        Allergies:  Allergies  Allergen Reactions   Lisinopril  Swelling    Family History: Family History  Problem Relation Age of Onset   Hypertension  Mother    Diabetes Mother    Arthritis Mother    Heart disease Mother    Miscarriages / [place] Mother    Stroke Mother    Depression Father    Mental illness Father    Lupus Sister    Diabetes Daughter    Hypertension Daughter    Colon cancer Neg Hx    Colon polyps Neg Hx    Cancer - Colon Neg Hx     Social History:  reports that he has never smoked. He has never used smokeless tobacco. He reports that he does not drink alcohol and does not use drugs.  ROS: All other review of systems were reviewed and are negative except what is noted above in HPI  Physical Exam: There were no vitals taken for this visit.  Constitutional:  Alert and oriented, No acute distress. HEENT: [place] AT, moist mucus membranes.  Trachea midline, no masses. Cardiovascular: No clubbing, cyanosis, or edema. Respiratory: Normal respiratory effort, no increased work of breathing. GI: No inguinal hernias GU: Normal cervical sized phallus. No masses/lesions on penis, testis, scrotum. Prostate 50 g smooth no nodules no induration.  Lymph: No cervical or inguinal lymphadenopathy. Skin: No rashes, bruises or suspicious lesions. Neurologic: Grossly intact, no focal deficits, moving all 4 extremities. Psychiatric: Normal mood and affect.  Laboratory Data: Lab Results  Component Value Date   WBC 9.1 04/11/2021   HGB 14.6 04/11/2021   HCT 44.3 04/11/2021   MCV 89.0 04/11/2021   PLT 174 04/11/2021    Lab Results  Component Value Date   CREATININE 0.88 03/26/2024    No results found for: PSA  No results found for: TESTOSTERONE  Lab Results  Component Value Date   HGBA1C 11.1 (H) 04/11/2021    Urinalysis    Component Value Date/Time   COLORURINE YELLOW 04/01/2020 1317   APPEARANCEUR Cloudy (A) 03/17/2024 1100   LABSPEC 1.016 04/01/2020 1317   PHURINE 6.0 04/01/2020 1317   GLUCOSEU 3+ (A) 03/17/2024 1100   HGBUR SMALL (A) 04/01/2020 1317   BILIRUBINUR Negative 03/17/2024 1100   KETONESUR  NEGATIVE 04/01/2020 1317   PROTEINUR 2+ (A) 03/17/2024 1100   PROTEINUR 30 (A) 04/01/2020 1317   NITRITE Positive (A) 03/17/2024 1100   NITRITE NEGATIVE 04/01/2020 1317   LEUKOCYTESUR 2+ (A) 03/17/2024 1100   LEUKOCYTESUR LARGE (A) 04/01/2020 1317    Lab Results  Component Value Date   LABMICR See below: 03/17/2024   WBCUA >30 (A) 03/17/2024   LABEPIT 0-10 03/17/2024   MUCUS Present 06/27/2021   BACTERIA Many (A) 03/17/2024    Pertinent Imaging: . 23 ppgs of PCP notes reviewed   Assessment: - Recurrent pyuria, symptomatic  -Normal PSA/DRE  -History of scrotal condyloma, phimosis, status post excision with circumcision, doing well  Plan:  - I sent in a  urine for culture.  No antibiotics administered yet  -Depending on bacterial species, will treat for extended period of time  -I will have him come back in 3 months There are no diagnoses linked to this encounter.  No follow-ups on file.  Garnette CHRISTELLA Shack, MD  [facility] [place]

## 2024-09-08 DIAGNOSIS — E782 Mixed hyperlipidemia: Secondary | ICD-10-CM | POA: Diagnosis not present

## 2024-09-08 DIAGNOSIS — I10 Essential (primary) hypertension: Secondary | ICD-10-CM | POA: Diagnosis not present

## 2024-09-08 DIAGNOSIS — Z23 Encounter for immunization: Secondary | ICD-10-CM | POA: Diagnosis not present

## 2024-09-08 DIAGNOSIS — E1169 Type 2 diabetes mellitus with other specified complication: Secondary | ICD-10-CM | POA: Diagnosis not present

## 2024-09-08 DIAGNOSIS — E538 Deficiency of other specified B group vitamins: Secondary | ICD-10-CM | POA: Diagnosis not present

## 2024-10-05 ENCOUNTER — Ambulatory Visit: Admitting: Podiatry

## 2024-10-05 DIAGNOSIS — Z91199 Patient's noncompliance with other medical treatment and regimen due to unspecified reason: Secondary | ICD-10-CM

## 2024-10-05 DIAGNOSIS — Z91198 Patient's noncompliance with other medical treatment and regimen for other reason: Secondary | ICD-10-CM

## 2024-10-05 NOTE — Progress Notes (Addendum)
 1. Failure to attend appointment with reason given    Rescheduled by patient.

## 2024-10-08 ENCOUNTER — Ambulatory Visit

## 2024-10-08 DIAGNOSIS — E1142 Type 2 diabetes mellitus with diabetic polyneuropathy: Secondary | ICD-10-CM | POA: Diagnosis not present

## 2024-10-08 DIAGNOSIS — M79675 Pain in left toe(s): Secondary | ICD-10-CM | POA: Diagnosis not present

## 2024-10-08 DIAGNOSIS — B351 Tinea unguium: Secondary | ICD-10-CM | POA: Diagnosis not present

## 2024-10-08 DIAGNOSIS — M79674 Pain in right toe(s): Secondary | ICD-10-CM | POA: Diagnosis not present

## 2024-10-08 NOTE — Progress Notes (Signed)
  Subjective:  Patient ID: Christopher Sullivan, male    DOB: 12/22/1955,  MRN: [id number]  68 y.o. male presents with at risk foot care with history of diabetic neuropathy and painful thick toenails that are difficult to trim. Pain interferes with ambulation. Aggravating factors include wearing enclosed shoe gear. Pain is relieved with periodic professional debridement. Chief Complaint  Patient presents with   Toe Pain    RFC     PCP: [name], MD. ARNETTA 04/13/2024.  New problem(s): None.   Review of Systems: Negative except as noted in the HPI.   Allergies  Allergen Reactions   Lisinopril  Swelling    Objective:  There were no vitals filed for this visit. Constitutional Patient is a pleasant 68 y.o. male in NAD. AAO x 3.  Vascular Capillary fill time to digits <3 seconds.  DP/PT pulse(s) are faintly palpable b/l lower extremities. Pedal hair absent b/l. Lower extremity skin temperature gradient warm to cool b/l. No pain with calf compression b/l. No cyanosis or clubbing noted. No ischemia nor gangrene noted b/l. No edema noted b/l LE.  Neurologic Protective sensation decreased with 10 gram monofilament b/l.  Dermatologic Pedal skin is thin, shiny and atrophic b/l.  No open wounds b/l lower extremities. No interdigital macerations b/l lower extremities. Toenails 1-5 b/l elongated, discolored, dystrophic, thickened, crumbly with subungual debris and tenderness to dorsal palpation. Hyperkeratotic lesion(s) submet head 1 left foot, submet head 5 left foot, and sub 5th met base left foot.  No erythema, no edema, no drainage, no fluctuance.  Orthopedic: Muscle strength 5/5 to all lower extremity muscle groups bilaterally. Hammertoe(s) 2-5 b/l. Pes planus deformity noted bilateral LE.    Assessment:   1. Pain due to onychomycosis of toenails of both feet   2. Type 2 diabetes mellitus with diabetic polyneuropathy, without long-term current use of insulin  (HCC)     Plan:  Consent given for  treatment. Patient examined.All patient's and/or POA's questions/concerns addressed on today's visit. Toenails 1-5 debrided in length and girth with pinpoint bleeding to left 4th toe that was cleaned with alcohol. Callus(es) submet head 1 left foot, submet head 5 left foot, and sub 5th met base left foot pared with sharp debridement without incident. Continue foot and shoe inspections daily. Monitor blood glucose per PCP/Endocrinologist's recommendations.Continue soft, supportive shoe gear daily. Report any pedal injuries to medical professional. Call office if there are any questions/concerns. -Patient/POA to call should there be question/concern in the interim.  RTC 3 months with Dr. Gaynel, DPM  Prentice JINNY Ovens, DPM      [facility] LOCATION: [address] [place] [address]                   Office [phone]   [facility] LOCATION: [address] [place], [place] [address] Office [phone]

## 2025-01-13 ENCOUNTER — Ambulatory Visit
